# Patient Record
Sex: Female | Born: 1990 | Race: White | Hispanic: Yes | Marital: Married | Smoking: Never smoker
Health system: Southern US, Community
[De-identification: ages and names within clinical notes are randomized; demographics above are authoritative.]

## PROBLEM LIST (undated history)

## (undated) ENCOUNTER — Inpatient Hospital Stay: Payer: Self-pay

## (undated) DIAGNOSIS — F329 Major depressive disorder, single episode, unspecified: Secondary | ICD-10-CM

## (undated) DIAGNOSIS — F32A Depression, unspecified: Secondary | ICD-10-CM

## (undated) DIAGNOSIS — G43909 Migraine, unspecified, not intractable, without status migrainosus: Secondary | ICD-10-CM

## (undated) DIAGNOSIS — F53 Postpartum depression: Secondary | ICD-10-CM

## (undated) DIAGNOSIS — O99345 Other mental disorders complicating the puerperium: Secondary | ICD-10-CM

## (undated) DIAGNOSIS — F419 Anxiety disorder, unspecified: Secondary | ICD-10-CM

## (undated) DIAGNOSIS — E119 Type 2 diabetes mellitus without complications: Secondary | ICD-10-CM

## (undated) HISTORY — PX: FOOT SURGERY: SHX648

## (undated) HISTORY — PX: KNEE SURGERY: SHX244

## (undated) HISTORY — DX: Depression, unspecified: F32.A

## (undated) HISTORY — PX: CHOLECYSTECTOMY: SHX55

## (undated) HISTORY — DX: Other mental disorders complicating the puerperium: O99.345

## (undated) HISTORY — DX: Postpartum depression: F53.0

## (undated) HISTORY — DX: Major depressive disorder, single episode, unspecified: F32.9

---

## 2013-02-16 ENCOUNTER — Emergency Department: Payer: Self-pay | Admitting: Emergency Medicine

## 2013-06-09 ENCOUNTER — Emergency Department: Payer: Self-pay | Admitting: Internal Medicine

## 2013-06-09 LAB — URINALYSIS, COMPLETE
Bacteria: NONE SEEN
Leukocyte Esterase: NEGATIVE
Ph: 7 (ref 4.5–8.0)
Protein: NEGATIVE
Specific Gravity: 1.003 (ref 1.003–1.030)
Squamous Epithelial: <1
WBC UR: <1 /HPF (ref 0–5)

## 2013-06-09 LAB — CBC
MCH: 29.1 pg (ref 26.0–34.0)
MCHC: 33.5 g/dL (ref 32.0–36.0)
MCV: 87 fL (ref 80–100)
Platelet: 321 10*3/uL (ref 150–440)
RBC: 4.7 10*6/uL (ref 3.80–5.20)
RDW: 13.1 % (ref 11.5–14.5)
WBC: 10.1 10*3/uL (ref 3.6–11.0)

## 2013-06-09 LAB — COMPREHENSIVE METABOLIC PANEL WITH GFR
Albumin: 3.8 g/dL
Alkaline Phosphatase: 90 U/L
Anion Gap: 6 — ABNORMAL LOW
BUN: 6 mg/dL — ABNORMAL LOW
Bilirubin,Total: 0.9 mg/dL
Calcium, Total: 9 mg/dL
Chloride: 106 mmol/L
Co2: 26 mmol/L
Creatinine: 0.65 mg/dL
EGFR (African American): >60
EGFR (Non-African Amer.): >60
Glucose: 91 mg/dL
Osmolality: 273
Potassium: 3.6 mmol/L
SGOT(AST): 26 U/L
SGPT (ALT): 27 U/L
Sodium: 138 mmol/L
Total Protein: 7.8 g/dL

## 2013-06-09 LAB — LIPASE, BLOOD: Lipase: 76 U/L (ref 73–393)

## 2013-06-09 LAB — GC/CHLAMYDIA PROBE AMP

## 2013-06-09 LAB — WET PREP, GENITAL

## 2014-06-17 ENCOUNTER — Emergency Department: Payer: Self-pay | Admitting: Emergency Medicine

## 2014-06-17 LAB — CBC WITH DIFFERENTIAL/PLATELET
BASOS PCT: 0.5 %
Basophil #: 0.1 10*3/uL (ref 0.0–0.1)
Eosinophil #: 0.1 10*3/uL (ref 0.0–0.7)
Eosinophil %: 1.3 %
HCT: 42 % (ref 35.0–47.0)
HGB: 13.4 g/dL (ref 12.0–16.0)
Lymphocyte #: 2.8 10*3/uL (ref 1.0–3.6)
Lymphocyte %: 25.7 %
MCH: 27.8 pg (ref 26.0–34.0)
MCHC: 31.9 g/dL — ABNORMAL LOW (ref 32.0–36.0)
MCV: 87 fL (ref 80–100)
Monocyte #: 0.8 x10 3/mm (ref 0.2–0.9)
Monocyte %: 7.4 %
NEUTROS ABS: 7 10*3/uL — AB (ref 1.4–6.5)
NEUTROS PCT: 65.1 %
PLATELETS: 333 10*3/uL (ref 150–440)
RBC: 4.82 10*6/uL (ref 3.80–5.20)
RDW: 13 % (ref 11.5–14.5)
WBC: 10.7 10*3/uL (ref 3.6–11.0)

## 2014-06-17 LAB — URINALYSIS, COMPLETE
BACTERIA: NONE SEEN
Bilirubin,UR: NEGATIVE
GLUCOSE, UR: NEGATIVE mg/dL (ref 0–75)
Ketone: NEGATIVE
LEUKOCYTE ESTERASE: NEGATIVE
Nitrite: NEGATIVE
PH: 6 (ref 4.5–8.0)
PROTEIN: NEGATIVE
RBC,UR: 1 /HPF (ref 0–5)
Specific Gravity: 1.005 (ref 1.003–1.030)
Squamous Epithelial: 1
WBC UR: <1 /HPF (ref 0–5)

## 2014-06-17 LAB — BASIC METABOLIC PANEL
Anion Gap: 6 — ABNORMAL LOW (ref 7–16)
BUN: 7 mg/dL (ref 7–18)
Calcium, Total: 9.6 mg/dL (ref 8.5–10.1)
Chloride: 104 mmol/L (ref 98–107)
Co2: 28 mmol/L (ref 21–32)
Creatinine: 0.77 mg/dL (ref 0.60–1.30)
EGFR (African American): >60
EGFR (Non-African Amer.): >60
Glucose: 102 mg/dL — ABNORMAL HIGH (ref 65–99)
Osmolality: 274 (ref 275–301)
Potassium: 3.8 mmol/L (ref 3.5–5.1)
Sodium: 138 mmol/L (ref 136–145)

## 2014-09-26 DIAGNOSIS — G43009 Migraine without aura, not intractable, without status migrainosus: Secondary | ICD-10-CM | POA: Insufficient documentation

## 2014-10-27 ENCOUNTER — Emergency Department: Payer: Self-pay | Admitting: Emergency Medicine

## 2014-11-01 ENCOUNTER — Ambulatory Visit: Payer: Self-pay | Admitting: Podiatry

## 2014-11-01 LAB — CBC WITH DIFFERENTIAL/PLATELET
Basophil #: 0 10*3/uL (ref 0.0–0.1)
Basophil %: 0.4 %
Eosinophil #: 0.2 10*3/uL (ref 0.0–0.7)
Eosinophil %: 1.9 %
HCT: 42.5 % (ref 35.0–47.0)
HGB: 13.8 g/dL (ref 12.0–16.0)
Lymphocyte #: 3.1 10*3/uL (ref 1.0–3.6)
Lymphocyte %: 39 %
MCH: 28.9 pg (ref 26.0–34.0)
MCHC: 32.6 g/dL (ref 32.0–36.0)
MCV: 89 fL (ref 80–100)
MONO ABS: 0.6 x10 3/mm (ref 0.2–0.9)
Monocyte %: 7.8 %
NEUTROS PCT: 50.9 %
Neutrophil #: 4 10*3/uL (ref 1.4–6.5)
Platelet: 323 10*3/uL (ref 150–440)
RBC: 4.8 10*6/uL (ref 3.80–5.20)
RDW: 12.6 % (ref 11.5–14.5)
WBC: 7.8 10*3/uL (ref 3.6–11.0)

## 2015-02-19 ENCOUNTER — Emergency Department: Payer: Self-pay | Admitting: Emergency Medicine

## 2015-04-19 NOTE — Op Note (Signed)
PATIENT NAME:  Shannon [name], Shannon [name] MR#:  [id number] DATE OF BIRTH:  05-18-1991  DATE OF PROCEDURE:  11/01/2014  PREOPERATIVE DIAGNOSIS: Ganglion cyst, left foot.   POSTOPERATIVE DIAGNOSIS: Ganglion cyst, left foot.   PROCEDURE: Excision ganglion cyst, left foot.   SURGEON: Linus Galasodd Sharmayne Jablon, DPM  ANESTHESIA: Local MAC.   HEMOSTASIS: Pneumatic tourniquet, left ankle, 250 mmHg.   ESTIMATED BLOOD LOSS: Minimal.   PATHOLOGY: Soft tissue cyst, left foot.   MATERIALS: None.   COMPLICATIONS: None apparent.   OPERATIVE INDICATIONS: This is [name] 24 year old female with [name] history of [name] painful cyst on the side of her left foot. She elects to have surgical removal due to her job demands.   OPERATIVE PROCEDURE: The patient was taken to the operating room and placed on the table in the supine position. Following satisfactory sedation, the left foot was anesthetized with 10 mL of 0.5% bupivacaine plain around the lesion on the lateral aspect of the left foot. [name] pneumatic tourniquet was applied at the level of the left ankle and the foot was prepped and draped in the usual sterile fashion. The foot was exsanguinated and the tourniquet inflated to 250 mmHg.   Attention was then directed to the lateral aspect of the right foot where an approximate 5 cm lazy S type incision was made over the cystic area. The incision was very carefully deepened via sharp and blunt dissection down to the level of the cyst. The normal surrounding anatomy was removed from around the cyst and it was removed in toto. The wound was then flushed with copious amounts of sterile saline and closed using 4-0 Vicryl running suture for all layers from deep and superficial subcutaneous to skin closure. Tincture of benzoin and Steri-Strips followed by Xeroform, 4 x 4's, Kerlix and an Ace wrap. The tourniquet was released and blood flow was noted to return immediately to the left foot in digits 1 through 4. The patient tolerated the procedure and  anesthesia well and was transported to the PAC-U with vital signs stable and in good condition.  ____________________________ Linus Galasodd Trenity Pha, DPM tc:sb D: 11/01/2014 10:46:47 ET T: 11/01/2014 11:30:15 ET JOB#: [id number]  cc: Linus Galasodd Mirai Greenwood, DPM, <Dictator> Narek Kniss DPM ELECTRONICALLY SIGNED 11/01/2014 13:57

## 2015-04-21 LAB — SURGICAL PATHOLOGY

## 2015-06-12 ENCOUNTER — Emergency Department
Admission: EM | Admit: 2015-06-12 | Discharge: 2015-06-13 | Disposition: A | Discharge status: Home / Self Care | Payer: Worker's Compensation | Attending: Emergency Medicine | Admitting: Emergency Medicine

## 2015-06-12 ENCOUNTER — Emergency Department: Payer: Worker's Compensation

## 2015-06-12 ENCOUNTER — Encounter: Payer: Self-pay | Admitting: *Deleted

## 2015-06-12 DIAGNOSIS — Y9389 Activity, other specified: Secondary | ICD-10-CM | POA: Insufficient documentation

## 2015-06-12 DIAGNOSIS — S8992XA Unspecified injury of left lower leg, initial encounter: Secondary | ICD-10-CM | POA: Diagnosis not present

## 2015-06-12 DIAGNOSIS — S50311A Abrasion of right elbow, initial encounter: Secondary | ICD-10-CM | POA: Diagnosis not present

## 2015-06-12 DIAGNOSIS — Y998 Other external cause status: Secondary | ICD-10-CM | POA: Diagnosis not present

## 2015-06-12 DIAGNOSIS — T148 Other injury of unspecified body region: Secondary | ICD-10-CM | POA: Insufficient documentation

## 2015-06-12 DIAGNOSIS — Y9289 Other specified places as the place of occurrence of the external cause: Secondary | ICD-10-CM | POA: Insufficient documentation

## 2015-06-12 DIAGNOSIS — S0990XA Unspecified injury of head, initial encounter: Secondary | ICD-10-CM | POA: Diagnosis not present

## 2015-06-12 DIAGNOSIS — T148XXA Other injury of unspecified body region, initial encounter: Secondary | ICD-10-CM

## 2015-06-12 HISTORY — DX: Migraine, unspecified, not intractable, without status migrainosus: G43.909

## 2015-06-12 MED ORDER — ACETAMINOPHEN 500 MG PO TABS
1000.0000 mg | ORAL_TABLET | Freq: Once | ORAL | Status: AC
  Administered 2015-06-12: 1000 mg via ORAL

## 2015-06-12 MED ORDER — ONDANSETRON 4 MG PO TBDP
4.0000 mg | ORAL_TABLET | Freq: Once | ORAL | Status: AC
Start: 2015-06-12 — End: 2015-06-12
  Administered 2015-06-12: 4 mg via ORAL

## 2015-06-12 MED ORDER — ONDANSETRON 4 MG PO TBDP
ORAL_TABLET | ORAL | Status: AC
Start: 2015-06-12 — End: 2015-06-12
  Administered 2015-06-12: 4 mg via ORAL
  Filled 2015-06-12: qty 1

## 2015-06-12 MED ORDER — ACETAMINOPHEN 500 MG PO TABS
ORAL_TABLET | ORAL | Status: AC
  Administered 2015-06-12: 1000 mg via ORAL
  Filled 2015-06-12: qty 2
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
## 2015-06-12 NOTE — ED Notes (Signed)
Wound on right elbow washed with sterile saline and dressed with gauze.  Wounds on knees bilaterally washed with sterile saline and dressed with gauze.

## 2015-06-12 NOTE — Discharge Instructions (Signed)
Please seek medical attention for any high fevers, chest pain, shortness of breath, change in behavior, persistent vomiting, bloody stool or any other new or concerning symptoms. ° ° °Concussion °A concussion, or closed-head injury, is a brain injury caused by a direct blow to the head or by a quick and sudden movement (jolt) of the head or neck. Concussions are usually not life-threatening. Even so, the effects of a concussion can be serious. If you have had a concussion before, you are more likely to experience concussion-like symptoms after a direct blow to the head.  °CAUSES °· Direct blow to the head, such as from running into another player during a soccer game, being hit in a fight, or hitting your head on a hard surface. °· A jolt of the head or neck that causes the brain to move back and forth inside the skull, such as in a car crash. °SIGNS AND SYMPTOMS °The signs of a concussion can be hard to notice. Early on, they may be missed by you, family members, and health care providers. You may look fine but act or feel differently. °Symptoms are usually temporary, but they may last for days, weeks, or even longer. Some symptoms may appear right away while others may not show up for hours or days. Every head injury is different. Symptoms include: °· Mild to moderate headaches that will not go away. °· A feeling of pressure inside your head. °· Having more trouble than usual: °¨ Learning or remembering things you have heard. °¨ Answering questions. °¨ Paying attention or concentrating. °¨ Organizing daily tasks. °¨ Making decisions and solving problems. °· Slowness in thinking, acting or reacting, speaking, or reading. °· Getting lost or being easily confused. °· Feeling tired all the time or lacking energy (fatigued). °· Feeling drowsy. °· Sleep disturbances. °¨ Sleeping more than usual. °¨ Sleeping less than usual. °¨ Trouble falling asleep. °¨ Trouble sleeping (insomnia). °· Loss of balance or feeling  lightheaded or dizzy. °· Nausea or vomiting. °· Numbness or tingling. °· Increased sensitivity to: °¨ Sounds. °¨ Lights. °¨ Distractions. °· Vision problems or eyes that tire easily. °· Diminished sense of taste or smell. °· Ringing in the ears. °· Mood changes such as feeling sad or anxious. °· Becoming easily irritated or angry for little or no reason. °· Lack of motivation. °· Seeing or hearing things other people do not see or hear (hallucinations). °DIAGNOSIS °Your health care provider can usually diagnose a concussion based on a description of your injury and symptoms. He or she will ask whether you passed out (lost consciousness) and whether you are having trouble remembering events that happened right before and during your injury. °Your evaluation might include: °· A brain scan to look for signs of injury to the brain. Even if the test shows no injury, you may still have a concussion. °· Blood tests to be sure other problems are not present. °TREATMENT °· Concussions are usually treated in an emergency department, in urgent care, or at a clinic. You may need to stay in the hospital overnight for further treatment. °· Tell your health care provider if you are taking any medicines, including prescription medicines, over-the-counter medicines, and natural remedies. Some medicines, such as blood thinners (anticoagulants) and aspirin, may increase the chance of complications. Also tell your health care provider whether you have had alcohol or are taking illegal drugs. This information may affect treatment. °· Your health care provider will send you home with important instructions to follow. °·   How fast you will recover from a concussion depends on many factors. These factors include how severe your concussion is, what part of your brain was injured, your age, and how healthy you were before the concussion.  Most people with mild injuries recover fully. Recovery can take time. In general, recovery is slower in  older persons. Also, persons who have had a concussion in the past or have other medical problems may find that it takes longer to recover from their current injury. HOME CARE INSTRUCTIONS General Instructions  Carefully follow the directions your health care provider gave you.  Only take over-the-counter or prescription medicines for pain, discomfort, or fever as directed by your health care provider.  Take only those medicines that your health care provider has approved.  Do not drink alcohol until your health care provider says you are well enough to do so. Alcohol and certain other drugs may slow your recovery and can put you at risk of further injury.  If it is harder than usual to remember things, write them down.  If you are easily distracted, try to do one thing at a time. For example, do not try to watch TV while fixing dinner.  Talk with family members or close friends when making important decisions.  Keep all follow-up appointments. Repeated evaluation of your symptoms is recommended for your recovery.  Watch your symptoms and tell others to do the same. Complications sometimes occur after a concussion. Older adults with a brain injury may have a higher risk of serious complications, such as a blood clot on the brain.  Tell your teachers, school nurse, school counselor, coach, athletic trainer, or work Production designer, theatre/television/film about your injury, symptoms, and restrictions. Tell them about what you can or cannot do. They should watch for:  Increased problems with attention or concentration.  Increased difficulty remembering or learning new information.  Increased time needed to complete tasks or assignments.  Increased irritability or decreased ability to cope with stress.  Increased symptoms.  Rest. Rest helps the brain to heal. Make sure you:  Get plenty of sleep at night. Avoid staying up late at night.  Keep the same bedtime hours on weekends and weekdays.  Rest during the day.  Take daytime naps or rest breaks when you feel tired.  Limit activities that require a lot of thought or concentration. These include:  Doing homework or job-related work.  Watching TV.  Working on the computer.  Avoid any situation where there is potential for another head injury (football, hockey, soccer, basketball, martial arts, downhill snow sports and horseback riding). Your condition will get worse every time you experience a concussion. You should avoid these activities until you are evaluated by the appropriate follow-up health care providers. Returning To Your Regular Activities You will need to return to your normal activities slowly, not all at once. You must give your body and brain enough time for recovery.  Do not return to sports or other athletic activities until your health care provider tells you it is safe to do so.  Ask your health care provider when you can drive, ride a bicycle, or operate heavy machinery. Your ability to react may be slower after a brain injury. Never do these activities if you are dizzy.  Ask your health care provider about when you can return to work or school. Preventing Another Concussion It is very important to avoid another brain injury, especially before you have recovered. In rare cases, another injury can lead to permanent  brain damage, brain swelling, or death. The risk of this is greatest during the first 7-10 days after a head injury. Avoid injuries by:  Wearing a seat belt when riding in a car.  Drinking alcohol only in moderation.  Wearing a helmet when biking, skiing, skateboarding, skating, or doing similar activities.  Avoiding activities that could lead to a second concussion, such as contact or recreational sports, until your health care provider says it is okay.  Taking safety measures in your home.  Remove clutter and tripping hazards from floors and stairways.  Use grab bars in bathrooms and handrails by stairs.  Place  non-slip mats on floors and in bathtubs.  Improve lighting in dim areas. SEEK MEDICAL CARE IF:  You have increased problems paying attention or concentrating.  You have increased difficulty remembering or learning new information.  You need more time to complete tasks or assignments than before.  You have increased irritability or decreased ability to cope with stress.  You have more symptoms than before. Seek medical care if you have any of the following symptoms for more than 2 weeks after your injury:  Lasting (chronic) headaches.  Dizziness or balance problems.  Nausea.  Vision problems.  Increased sensitivity to noise or light.  Depression or mood swings.  Anxiety or irritability.  Memory problems.  Difficulty concentrating or paying attention.  Sleep problems.  Feeling tired all the time. SEEK IMMEDIATE MEDICAL CARE IF:  You have severe or worsening headaches. These may be a sign of a blood clot in the brain.  You have weakness (even if only in one hand, leg, or part of the face).  You have numbness.  You have decreased coordination.  You vomit repeatedly.  You have increased sleepiness.  One pupil is larger than the other.  You have convulsions.  You have slurred speech.  You have increased confusion. This may be a sign of a blood clot in the brain.  You have increased restlessness, agitation, or irritability.  You are unable to recognize people or places.  You have neck pain.  It is difficult to wake you up.  You have unusual behavior changes.  You lose consciousness. MAKE SURE YOU:  Understand these instructions.  Will watch your condition.  Will get help right away if you are not doing well or get worse. Document Released: 03/04/2004 Document Revised: 12/18/2013 Document Reviewed: 07/05/2013 [facility] Patient Information 2015 [employer] [place]. This information is not intended to replace advice given to you by your health care  provider. Make sure you discuss any questions you have with your health care provider.

## 2015-06-12 NOTE — ED Notes (Signed)
Notified by charge nurse that pt needs to provide urine sample for workman's comp.  Pt notified by charge nurse
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
## 2015-10-03 LAB — OB RESULTS CONSOLE HIV ANTIBODY (ROUTINE TESTING): HIV: NONREACTIVE

## 2015-10-03 LAB — OB RESULTS CONSOLE ABO/RH: RH Type: POSITIVE

## 2015-10-03 LAB — OB RESULTS CONSOLE HGB/HCT, BLOOD
HEMATOCRIT: 40 %
HEMOGLOBIN: 13.6 g/dL

## 2015-10-03 LAB — OB RESULTS CONSOLE HEPATITIS B SURFACE ANTIGEN: Hepatitis B Surface Ag: NEGATIVE

## 2015-10-03 LAB — OB RESULTS CONSOLE RUBELLA ANTIBODY, IGM: Rubella: IMMUNE

## 2015-10-03 LAB — OB RESULTS CONSOLE PLATELET COUNT: Platelets: 376 10*3/uL

## 2015-10-03 LAB — OB RESULTS CONSOLE RPR: RPR: NONREACTIVE

## 2015-10-03 LAB — OB RESULTS CONSOLE VARICELLA ZOSTER ANTIBODY, IGG: VARICELLA IGG: IMMUNE

## 2015-10-03 LAB — OB RESULTS CONSOLE ANTIBODY SCREEN: ANTIBODY SCREEN: NEGATIVE

## 2015-12-18 ENCOUNTER — Encounter: Payer: Self-pay | Admitting: Emergency Medicine

## 2015-12-18 ENCOUNTER — Emergency Department
Admission: EM | Admit: 2015-12-18 | Discharge: 2015-12-18 | Disposition: A | Discharge status: Home / Self Care | Payer: BLUE CROSS/BLUE SHIELD | Attending: Emergency Medicine | Admitting: Emergency Medicine

## 2015-12-18 ENCOUNTER — Emergency Department: Payer: BLUE CROSS/BLUE SHIELD

## 2015-12-18 DIAGNOSIS — S3991XA Unspecified injury of abdomen, initial encounter: Secondary | ICD-10-CM | POA: Insufficient documentation

## 2015-12-18 DIAGNOSIS — Z3492 Encounter for supervision of normal pregnancy, unspecified, second trimester: Secondary | ICD-10-CM

## 2015-12-18 DIAGNOSIS — O9A212 Injury, poisoning and certain other consequences of external causes complicating pregnancy, second trimester: Secondary | ICD-10-CM | POA: Insufficient documentation

## 2015-12-18 DIAGNOSIS — Z79899 Other long term (current) drug therapy: Secondary | ICD-10-CM | POA: Insufficient documentation

## 2015-12-18 DIAGNOSIS — Y9389 Activity, other specified: Secondary | ICD-10-CM | POA: Insufficient documentation

## 2015-12-18 DIAGNOSIS — Y92410 Unspecified street and highway as the place of occurrence of the external cause: Secondary | ICD-10-CM | POA: Insufficient documentation

## 2015-12-18 DIAGNOSIS — Z3A18 18 weeks gestation of pregnancy: Secondary | ICD-10-CM | POA: Diagnosis not present

## 2015-12-18 DIAGNOSIS — R109 Unspecified abdominal pain: Secondary | ICD-10-CM

## 2015-12-18 DIAGNOSIS — Y998 Other external cause status: Secondary | ICD-10-CM | POA: Insufficient documentation

## 2015-12-18 LAB — URINALYSIS COMPLETE WITH MICROSCOPIC (ARMC ONLY)
Bilirubin Urine: NEGATIVE
GLUCOSE, UA: NEGATIVE mg/dL
Ketones, ur: NEGATIVE mg/dL
LEUKOCYTES UA: NEGATIVE
Nitrite: NEGATIVE
Protein, ur: NEGATIVE mg/dL
SPECIFIC GRAVITY, URINE: 1.008 (ref 1.005–1.030)
pH: 6 (ref 5.0–8.0)

## 2015-12-18 LAB — ABO/RH: ABO/RH(D): O POS

## 2015-12-18 LAB — CBC
HEMATOCRIT: 39.9 % (ref 35.0–47.0)
HEMOGLOBIN: 13.2 g/dL (ref 12.0–16.0)
MCH: 28.8 pg (ref 26.0–34.0)
MCHC: 33 g/dL (ref 32.0–36.0)
MCV: 87.5 fL (ref 80.0–100.0)
Platelets: 361 10*3/uL (ref 150–440)
RBC: 4.56 MIL/uL (ref 3.80–5.20)
RDW: 12.9 % (ref 11.5–14.5)
WBC: 12 10*3/uL — AB (ref 3.6–11.0)

## 2015-12-18 LAB — HCG, QUANTITATIVE, PREGNANCY: HCG, BETA CHAIN, QUANT, S: 37169 m[IU]/mL — AB (ref <5)
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
## 2015-12-18 NOTE — ED Notes (Signed)
Pt dc home ambulatory rates pain 1/10 instructed on follow up plan no RX provided. PT NAD AT DC
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
## 2015-12-28 NOTE — L&D Delivery Note (Addendum)
Obstetrical Delivery Note   Date of Delivery:   05/21/2016 Primary OB:   [facility] Gestational Age/EDD: [id number]  Antepartum complications: BMI 33, GBS positive  Delivered By:   Cornelia Copaharlie Jullian Clayson, Jr MD  Delivery Type:   vaginal birth after cesarean (VBAC)  Delivery Details:   Patient pushed well and easily to +3. Peds in room for meconium on admission and during labor. Over several UCs, patient delivered infant in OA position. Loose nuchal x 1 reduced and then rest of infant easily delivered. Thick meconium seen and cord cut and clamped immediately and handed to peds team. Anesthesia:    epidural Intrapartum complications: Thick meconium GBS:    Positive and she was adequately treated with pencillin Laceration:    Right peri-uretheral sutured with 3-0 plain gut.  Episiotomy:    none Placenta:    Delivered and expressed via active management. Intact: yes. To pathology: no.  Estimated Blood Loss:  250mL  Baby:    Liveborn female, APGARs 8/9, weight 3590gm  Cornelia Copaharlie Harold Mattes, Jr. MD [facility] Pager [phone]

## 2016-04-01 ENCOUNTER — Observation Stay
Admission: EM | Admit: 2016-04-01 | Discharge: 2016-04-02 | Disposition: A | Discharge status: Home / Self Care | Payer: BLUE CROSS/BLUE SHIELD | Attending: Obstetrics & Gynecology | Admitting: Obstetrics & Gynecology

## 2016-04-01 DIAGNOSIS — Z3A33 33 weeks gestation of pregnancy: Secondary | ICD-10-CM | POA: Insufficient documentation

## 2016-04-01 LAB — URINALYSIS COMPLETE WITH MICROSCOPIC (ARMC ONLY)
BACTERIA UA: NONE SEEN
BILIRUBIN URINE: NEGATIVE
Glucose, UA: NEGATIVE mg/dL
HGB URINE DIPSTICK: NEGATIVE
Ketones, ur: NEGATIVE mg/dL
LEUKOCYTES UA: NEGATIVE
NITRITE: NEGATIVE
PH: 6 (ref 5.0–8.0)
PROTEIN: NEGATIVE mg/dL
Specific Gravity, Urine: 1.009 (ref 1.005–1.030)

## 2016-04-01 MED ORDER — ACETAMINOPHEN 325 MG PO TABS
650.0000 mg | ORAL_TABLET | ORAL | Status: DC | PRN
  Administered 2016-04-01: 650 mg via ORAL
  Filled 2016-04-01: qty 2

## 2016-04-01 MED ORDER — BETAMETHASONE SOD PHOS & ACET 6 (3-3) MG/ML IJ SUSP
12.0000 mg | INTRAMUSCULAR | Status: DC
  Administered 2016-04-01: 12 mg via INTRAMUSCULAR
  Filled 2016-04-01: qty 2

## 2016-04-01 MED ORDER — ONDANSETRON HCL 4 MG/2ML IJ SOLN: 4.0000 mg | Freq: Four times a day (QID) | INTRAMUSCULAR | Status: DC | PRN

## 2016-04-01 MED ORDER — LACTATED RINGERS IV BOLUS (SEPSIS)
1000.0000 mL | Freq: Once | INTRAVENOUS | Status: AC
  Administered 2016-04-01: 1000 mL via INTRAVENOUS

## 2016-04-01 NOTE — OB Triage Note (Signed)
Pt present in no acute distress, reports she started having pressure in lower abdomen this morning, walked a lot to see if that would relieve the pressure, then started having painful contractions and lower back pain since about 5:45p this evening, says it worsened and then relieved since that time, rates pain when present 5/10. States she was sick this week with n/v/d, seen on Tues and was given rx for Phenergan suppository, took med Tues and has not had to take any since then, started tolerating po yesterday and today was able to keep liquids and solid foods down without n/v/d. Denies cough cold sym, fever or chills. Concerned that she may have a uti, feels pressure and having hesitancy with urination since this morning. Denies dysuria, hematuria, frequency, urgency, or odor. Pt confirms +FM, denies vag bleeding, spotting, leaking fluid or unusual vaginal discharge. [facility], next appt 04/12/16. [facility]

## 2016-04-02 ENCOUNTER — Inpatient Hospital Stay
Admission: RE | Admit: 2016-04-02 | Discharge: 2016-04-02 | Disposition: A | Discharge status: Home / Self Care | Payer: BLUE CROSS/BLUE SHIELD | Attending: Obstetrics and Gynecology | Admitting: Obstetrics and Gynecology

## 2016-04-02 DIAGNOSIS — Z3490 Encounter for supervision of normal pregnancy, unspecified, unspecified trimester: Secondary | ICD-10-CM | POA: Insufficient documentation

## 2016-04-02 MED ORDER — BETAMETHASONE SOD PHOS & ACET 6 (3-3) MG/ML IJ SUSP
12.0000 mg | Freq: Once | INTRAMUSCULAR | Status: AC
  Administered 2016-04-02: 12 mg via INTRAMUSCULAR

## 2016-04-02 MED ORDER — ZOLPIDEM TARTRATE 5 MG PO TABS
5.0000 mg | ORAL_TABLET | Freq: Once | ORAL | Status: AC
  Administered 2016-04-02: 5 mg via ORAL
  Filled 2016-04-02: qty 1
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
## 2016-04-02 NOTE — Final Progress Note (Signed)
Physician Final Progress Note  Patient ID: Shannon [name] MRN: [id number] DOB/AGE: [birth year] 24 y.o.  Admit date: 04/01/2016 Admitting provider: Nadara Mustardobert P Elenore Wanninger, MD Discharge date: 04/02/2016   Admission Diagnoses: Contractions  Discharge Diagnoses:  Active Problems:   Preterm labor  Procedures: A NST procedure was performed with FHR monitoring and a normal baseline established, appropriate time of 20-40 minutes of evaluation, and accels >2 seen w 15x15 characteristics.  Results show a REACTIVE NST.   Discharge Condition: good  Disposition: 01-Home or Self Care  Diet: Regular diet  Discharge Activity: No lifting, driving, or strenuous exercise for 1 week    Medication List    ASK your doctor about these medications        acetaminophen 500 MG tablet  Commonly known as:  TYLENOL  Take 1,000 mg by mouth every 6 (six) hours as needed (back and abdominal pain).  Notes to Patient:  Last dose of Tylenol 650mg  was given at 1048 pm     norethindrone-ethinyl estradiol-iron 1-20/1-30/1-35 MG-MCG tablet  Commonly known as:  ESTROSTEP FE,TILIA FE,TRI-LEGEST FE  Take 1 tablet by mouth daily. Reported on 04/01/2016     PRENATAL VITAMIN PO  Take 1 tablet by mouth daily.           Follow-up Information    Follow up with [facility]. Go in 10 days.   Why:  return to hospital sooner if needed or if symptoms persist or worsens      Triage note, nurse visit (MD did not see pt).  I was able to review NST, labs, and d/w nurse.  Signed: Letitia LibraRobert Paul Cristal Qadir 04/02/2016, 7:57 AM

## 2016-04-12 ENCOUNTER — Observation Stay
Admission: EM | Admit: 2016-04-12 | Discharge: 2016-04-12 | Disposition: A | Discharge status: Home / Self Care | Payer: BLUE CROSS/BLUE SHIELD | Attending: Obstetrics and Gynecology | Admitting: Obstetrics and Gynecology

## 2016-04-12 ENCOUNTER — Encounter: Payer: Self-pay | Admitting: *Deleted

## 2016-04-12 DIAGNOSIS — Z3A34 34 weeks gestation of pregnancy: Secondary | ICD-10-CM | POA: Diagnosis not present

## 2016-04-12 DIAGNOSIS — O4703 False labor before 37 completed weeks of gestation, third trimester: Principal | ICD-10-CM | POA: Insufficient documentation

## 2016-04-12 NOTE — Discharge Summary (Signed)
Patient discharged home, discharge instructions given, patient states understanding. Patient left floor in stable condition, denies any other needs at this time. Patient to keep next scheduled OB appointment 

## 2016-04-12 NOTE — Discharge Instructions (Signed)
Drink plenty of fluid and get plenty of rest. Call your provider for any other concerns °

## 2016-04-12 NOTE — Final Progress Note (Signed)
Physician Final Progress Note  Patient ID: Shannon [name] MRN: [id number] DOB/AGE: 01-22-1991 25 y.o.  Admit date: 04/12/2016 Admitting provider: Ruth Bing, MD Discharge date: 04/12/2016   Admission Diagnoses: preterm contractions  Discharge Diagnoses:  Active Problems:   Indication for care in labor and delivery, antepartum  IUP at 34 1/7   Consults: None  Significant Findings/ Diagnostic Studies: none  Procedures: NST Category I Baseline 150 bpm, moderate variability, + accelerations, - decelerations  Discharge Condition: good  Disposition: 01-Home or Self Care  Diet: Regular diet  Discharge Activity: Activity as tolerated      Discharge Instructions    Discharge activity:  No Restrictions    Complete by:  As directed      Discharge diet:  No restrictions    Complete by:  As directed      Fetal Kick Count:  Lie on our left side for one hour after a meal, and count the number of times your baby kicks.  If it is less than 5 times, get up, move around and drink some juice.  Repeat the test 30 minutes later.  If it is still less than 5 kicks in an hour, notify your doctor.    Complete by:  As directed      No sexual activity restrictions    Complete by:  As directed      Notify physician for a general feeling that "something is not right"    Complete by:  As directed      Notify physician for increase or change in vaginal discharge    Complete by:  As directed      Notify physician for intestinal cramps, with or without diarrhea, sometimes described as "gas pain"    Complete by:  As directed      Notify physician for leaking of fluid    Complete by:  As directed      Notify physician for low, dull backache, unrelieved by heat or Tylenol    Complete by:  As directed      Notify physician for menstrual like cramps    Complete by:  As directed      Notify physician for pelvic pressure    Complete by:  As directed      Notify physician for uterine contractions.   These may be painless and feel like the uterus is tightening or the baby is  "balling up"    Complete by:  As directed      Notify physician for vaginal bleeding    Complete by:  As directed      PRETERM LABOR:  Includes any of the follwing symptoms that occur between 20 - [redacted] weeks gestation.  If these symptoms are not stopped, preterm labor can result in preterm delivery, placing your baby at risk    Complete by:  As directed             Medication List    TAKE these medications        acetaminophen 500 MG tablet  Commonly known as:  TYLENOL  Take 1,000 mg by mouth every 6 (six) hours as needed (back and abdominal pain).     PRENATAL VITAMIN PO  Take 1 tablet by mouth daily.       Follow-up Information    Please follow up.   Why:  keep regular scheduled appointment      Follow up On 04/12/2016.   Why:  As needed, If symptoms worsen  Total time spent taking care of this patient: 20 minutes  Signed: Tresea MallGLEDHILL,Leandria Thier, CNM  This patient and plan were discussed with Dr Vergie LivingPickens 04/12/2016

## 2016-04-12 NOTE — OB Triage Note (Signed)
patient states she woke up at 3am with contractions every 2 minutes, after drinking water and taking a warm bath they spaced out to every 10-5 minutes but have returned to every 5 minutes.  Denies LOF or vaginal bleeding, states baby is moving well

## 2016-04-25 ENCOUNTER — Inpatient Hospital Stay: Admission: EM | Admit: 2016-04-25 | Payer: Self-pay

## 2016-04-25 ENCOUNTER — Inpatient Hospital Stay
Admission: EM | Admit: 2016-04-25 | Discharge: 2016-04-25 | Disposition: A | Discharge status: Home / Self Care | Payer: BLUE CROSS/BLUE SHIELD | Attending: Obstetrics & Gynecology | Admitting: Obstetrics & Gynecology

## 2016-04-25 ENCOUNTER — Encounter: Payer: Self-pay | Admitting: *Deleted

## 2016-04-25 DIAGNOSIS — Z3A36 36 weeks gestation of pregnancy: Secondary | ICD-10-CM | POA: Insufficient documentation

## 2016-04-25 NOTE — OB Triage Note (Signed)
Contractions started at 0300 this morning and continues to contract.  Pain 7/10, back pain.

## 2016-04-25 NOTE — Final Progress Note (Signed)
Physician Final Progress Note  Patient ID: Shannon [name] MRN: [id number] DOB/AGE: [birth year] 24 y.o.  Admit date: 04/25/2016 Admitting provider: Nadara Mustardobert P Harris, MD Discharge date: 04/25/2016   Admission Diagnoses: Threatened preterm labor at [redacted] weeks gestation  Discharge Diagnoses:  False labor  Consults: None  Significant Findings/ Diagnostic Studies: 25 year old g1 p0 with EDC=05/23/2016 by a 6wk5d ultrasound presented at 36 weeks with c/o contractions since 0300 this Am. Contractions only lasting 30 sec, and they seemed to decrease in frequency on arrival to L&D. Cervix on arrival was 2.5cm-3cm/75%/-1 which was a change from last week when she was 1 cm. Felt a little leakage of fluid after ambulating to try to stimulate contractions. [facility] for [facility], elevated one hour GTT at 28 weeks (161) with a normal 3 hr GTT, and threatened preterm labor earlier this month at which time she received BMZ x 2 doses April 6&7. Exam: General: appears flushed, in NAD BP 116/80 mmHg  Pulse 101  Temp(Src) 98.7 F (37.1 C) (Oral)  Ht 5\' 1"  (1.549 m)  Wt 88.905 kg (196 lb)  BMI 37.05 kg/m2  LMP 06/12/2015  FHR: baseline 145-150 with accelerations to 170s to 180, moderate variability Toco: contractions q2-13 min apart initially, then q6-7 min apart after ambulation 4 hours later, although not feeling discomfort with the contractions Nitrazine: negative Cervix: 2.5-3/ 50%/-1 on repeat exam  A: IUP at 36 weeks with preterm contractions, no further cervical change Reactive NST/ Cat 1  P: DC home with labor precautions   Procedures: NST  Discharge Condition: stable  Disposition: 01-Home or Self Care  Diet: Regular diet  Discharge Activity: Activity as tolerated     Medication List    ASK your doctor about these medications        acetaminophen 500 MG tablet  Commonly known as:  TYLENOL  Take 1,000 mg by mouth every 6 (six) hours as needed (back and  abdominal pain).     PRENATAL VITAMIN PO  Take 1 tablet by mouth daily.       [facility]  Total time spent taking care of this patient: 15-20 minutes  Signed: Farrel ConnersGUTIERREZ, Persais Ethridge 04/25/2016, 4:27 PM

## 2016-04-25 NOTE — Discharge Instructions (Signed)
Discharge instructions reviewed with patient, patient verbalized understanding, copy signed and given.  Labor precautions reviewed with patient, pt. Verbalized understanding.  Encouraged patient to continue with scheduled OB appointments.

## 2016-04-29 LAB — OB RESULTS CONSOLE GC/CHLAMYDIA
Chlamydia: NEGATIVE
GC PROBE AMP, GENITAL: NEGATIVE

## 2016-04-29 LAB — OB RESULTS CONSOLE GBS: STREP GROUP B AG: POSITIVE

## 2016-05-02 ENCOUNTER — Observation Stay
Admission: EM | Admit: 2016-05-02 | Discharge: 2016-05-02 | Disposition: A | Discharge status: Home / Self Care | Payer: BLUE CROSS/BLUE SHIELD | Attending: Obstetrics & Gynecology | Admitting: Obstetrics & Gynecology

## 2016-05-02 DIAGNOSIS — Z3A37 37 weeks gestation of pregnancy: Secondary | ICD-10-CM | POA: Diagnosis not present

## 2016-05-02 DIAGNOSIS — R51 Headache: Secondary | ICD-10-CM | POA: Insufficient documentation

## 2016-05-02 DIAGNOSIS — O9989 Other specified diseases and conditions complicating pregnancy, childbirth and the puerperium: Secondary | ICD-10-CM | POA: Diagnosis present

## 2016-05-02 MED ORDER — BUTALBITAL-APAP-CAFFEINE 50-325-40 MG PO TABS
1.0000 | ORAL_TABLET | ORAL | Status: AC | PRN
  Administered 2016-05-02 (×2): 1 via ORAL
  Filled 2016-05-02 (×2): qty 1

## 2016-05-02 NOTE — OB Triage Note (Signed)
Pt c/o ctx since 830 AM this morning coming q 4 minutes.  HA "feels like a migraine" starting around 130 PM this afternoon.  Diarrhea and nausea since last night.  No vomiting, able to eat and drink normally.  Pt denies LOF and reports positive FM.

## 2016-05-05 ENCOUNTER — Inpatient Hospital Stay
Admission: EM | Admit: 2016-05-05 | Discharge: 2016-05-05 | Disposition: A | Discharge status: Home / Self Care | Payer: BLUE CROSS/BLUE SHIELD | Attending: Obstetrics and Gynecology | Admitting: Obstetrics and Gynecology

## 2016-05-05 DIAGNOSIS — Z3A37 37 weeks gestation of pregnancy: Secondary | ICD-10-CM | POA: Insufficient documentation

## 2016-05-05 NOTE — OB Triage Note (Signed)
Pt presents to L&D with c/o contractions since 2 am q 2 min. Denies LOF or vaginal bleeding, reports good fetal movement. EFM applied and explained, plan to monitor fetal and maternal well being and assess for labor. Pt has a h/o preterm labor this pregnancy and has had 2 doses of betamethasone.

## 2016-05-05 NOTE — Final Progress Note (Signed)
Physician Final Progress Note  Patient ID: Aniceto BossKarla A Topete MRN: [id number] DOB/AGE: Jan 10, 1991 24 y.o.  Admit date: 05/05/2016 Admitting provider: Conard NovakStephen D Jackson, MD Discharge date: 05/05/2016   Admission Diagnoses: Contractions/ IUP at 37.3 weeks  Discharge Diagnoses:  IUP at 37.3 weeks with false labor vs prodromal labor  Consults: None  Significant Findings/ Diagnostic Studies: 25 year old G1 P0 with EDC=05/23/2016 who presented this Am at 37.3 weeks with complaints of frequent contractions since 0300 this Am. Her contractions petered out with po hydration here and her cervix remained 3cm dilated over 2 hours.  FHR was reactive with baseline 150 and accelerations to 170s, moderate variability. She was discharged home with labor precautions.  Procedures: none  Discharge Condition: stable  Disposition: 01-Home or Self Care  Diet: Regular diet  Discharge Activity: Activity as tolerated     Medication List    ASK your doctor about these medications        acetaminophen 500 MG tablet  Commonly known as:  TYLENOL  Take 1,000 mg by mouth every 6 (six) hours as needed (back and abdominal pain).     PRENATAL VITAMIN PO  Take 1 tablet by mouth daily.         Total time spent taking care of this patient: 10 minutes  Signed: Dougles Kimmey 05/05/2016, 8:59 AM

## 2016-05-08 ENCOUNTER — Inpatient Hospital Stay
Admission: EM | Admit: 2016-05-08 | Discharge: 2016-05-09 | Disposition: A | Discharge status: Home / Self Care | Payer: BLUE CROSS/BLUE SHIELD | Attending: Obstetrics and Gynecology | Admitting: Obstetrics and Gynecology

## 2016-05-08 ENCOUNTER — Encounter: Payer: Self-pay | Admitting: *Deleted

## 2016-05-08 DIAGNOSIS — Z3A38 38 weeks gestation of pregnancy: Secondary | ICD-10-CM | POA: Insufficient documentation

## 2016-05-08 NOTE — OB Triage Note (Addendum)
Contractions every 4 minutes since 5pm, denies any other complain no LOF, vaginal beleeding. Pt states baby is moving well

## 2016-05-09 DIAGNOSIS — Z3A38 38 weeks gestation of pregnancy: Secondary | ICD-10-CM | POA: Diagnosis not present

## 2016-05-09 NOTE — Discharge Instructions (Signed)
Keep next office appt. Come to hosp. If cont. Get regular and more painful.

## 2016-05-09 NOTE — Final Progress Note (Signed)
Physician Final Progress Note  Patient ID: Shannon [name] MRN: [id number] DOB/AGE: 25-01-1991 24 y.o.  Admit date: 05/08/2016 Admitting provider: Conard NovakStephen D Brendan Gruwell, MD Discharge date: 05/09/2016   Admission Diagnoses: Contractions/ IUP at 7965w0d weeks  Discharge Diagnoses:  IUP at 1365w0d with false labor vs prodromal labor  Consults: None  Significant Findings/ Diagnostic Studies: 25 year old G1 P0 with EDC=05/23/2016 who presented this Am at 4465w0d weeks with complaints of frequent contractions since yesterday late afternoon. Her contractions decreased in intensity and frequency while at the hospital.  Her cervical exam was called 4.5cm overnight by RN. However, on my exam this morning, her exam was more consistent with 3-3.5cm.   FHR was reactive with baseline 145 and accelerations, moderate variability. She was discharged home with labor precautions.  Procedures: NST (described above)  Discharge Condition: stable  Disposition: 01-Home or Self Care  Diet: Regular diet  Discharge Activity: Activity as tolerated     Medication List    ASK your doctor about these medications        acetaminophen 500 MG tablet  Commonly known as:  TYLENOL  Take 1,000 mg by mouth every 6 (six) hours as needed (back and abdominal pain).     PRENATAL VITAMIN PO  Take 1 tablet by mouth daily.       Total time spent taking care of this patient: 30 minutes  Signed: Conard NovakJackson, Helayne Metsker D 05/09/2016, 8:33 AM

## 2016-05-12 NOTE — Discharge Summary (Signed)
Patient presented for evaluation of labor.  Patient had cervical exam by RN and this was reported to me. I reviewed her vital signs and fetal tracing, both of which were reassuring.  Patient was discharged as she was not laboring.  She was given medication, food, and time to rest for her headache which resolved prior to discharge.  NST interpretation: Reactive.  Ranae Plumberhelsea Kellis Mcadam, MD Attending Obstetrician and Gynecologist [facility]

## 2016-05-13 ENCOUNTER — Observation Stay
Admission: EM | Admit: 2016-05-13 | Discharge: 2016-05-13 | Disposition: A | Discharge status: Home / Self Care | Payer: BLUE CROSS/BLUE SHIELD | Attending: Certified Nurse Midwife | Admitting: Certified Nurse Midwife

## 2016-05-13 DIAGNOSIS — E860 Dehydration: Secondary | ICD-10-CM | POA: Insufficient documentation

## 2016-05-13 DIAGNOSIS — O471 False labor at or after 37 completed weeks of gestation: Secondary | ICD-10-CM | POA: Diagnosis not present

## 2016-05-13 DIAGNOSIS — Z3A38 38 weeks gestation of pregnancy: Secondary | ICD-10-CM | POA: Diagnosis not present

## 2016-05-13 DIAGNOSIS — O99613 Diseases of the digestive system complicating pregnancy, third trimester: Secondary | ICD-10-CM | POA: Insufficient documentation

## 2016-05-13 DIAGNOSIS — R112 Nausea with vomiting, unspecified: Secondary | ICD-10-CM | POA: Diagnosis not present

## 2016-05-13 DIAGNOSIS — Z888 Allergy status to other drugs, medicaments and biological substances status: Secondary | ICD-10-CM | POA: Diagnosis not present

## 2016-05-13 DIAGNOSIS — O219 Vomiting of pregnancy, unspecified: Secondary | ICD-10-CM | POA: Diagnosis present

## 2016-05-13 LAB — COMPREHENSIVE METABOLIC PANEL
ALBUMIN: 2.6 g/dL — AB (ref 3.5–5.0)
ALK PHOS: 210 U/L — AB (ref 38–126)
ALT: 37 U/L (ref 14–54)
AST: 34 U/L (ref 15–41)
Anion gap: 9 (ref 5–15)
BILIRUBIN TOTAL: 0.9 mg/dL (ref 0.3–1.2)
BUN: 6 mg/dL (ref 6–20)
CALCIUM: 8.4 mg/dL — AB (ref 8.9–10.3)
CO2: 22 mmol/L (ref 22–32)
Chloride: 104 mmol/L (ref 101–111)
Creatinine, Ser: 0.62 mg/dL (ref 0.44–1.00)
GFR calc Af Amer: >60 mL/min (ref >60)
GFR calc non Af Amer: >60 mL/min (ref >60)
GLUCOSE: 81 mg/dL (ref 65–99)
Potassium: 3.8 mmol/L (ref 3.5–5.1)
SODIUM: 135 mmol/L (ref 135–145)
TOTAL PROTEIN: 6.4 g/dL — AB (ref 6.5–8.1)

## 2016-05-13 LAB — URINALYSIS COMPLETE WITH MICROSCOPIC (ARMC ONLY)
BILIRUBIN URINE: NEGATIVE
GLUCOSE, UA: NEGATIVE mg/dL
Hgb urine dipstick: NEGATIVE
LEUKOCYTES UA: NEGATIVE
Nitrite: NEGATIVE
Protein, ur: 30 mg/dL — AB
Specific Gravity, Urine: 1.025 (ref 1.005–1.030)
pH: 5 (ref 5.0–8.0)

## 2016-05-13 LAB — CBC WITH DIFFERENTIAL/PLATELET
Basophils Absolute: 0 10*3/uL (ref 0–0.1)
EOS ABS: 0 10*3/uL (ref 0–0.7)
Eosinophils Relative: 0 %
HEMATOCRIT: 35.1 % (ref 35.0–47.0)
HEMOGLOBIN: 11.7 g/dL — AB (ref 12.0–16.0)
LYMPHS ABS: 1.1 10*3/uL (ref 1.0–3.6)
Lymphocytes Relative: 11 %
MCH: 26.7 pg (ref 26.0–34.0)
MCHC: 33.4 g/dL (ref 32.0–36.0)
MCV: 79.9 fL — ABNORMAL LOW (ref 80.0–100.0)
MONO ABS: 0.6 10*3/uL (ref 0.2–0.9)
Neutro Abs: 8.5 10*3/uL — ABNORMAL HIGH (ref 1.4–6.5)
Platelets: 324 10*3/uL (ref 150–440)
RBC: 4.39 MIL/uL (ref 3.80–5.20)
RDW: 14.2 % (ref 11.5–14.5)
WBC: 10.2 10*3/uL (ref 3.6–11.0)

## 2016-05-13 LAB — LIPASE, BLOOD: Lipase: 16 U/L (ref 11–51)

## 2016-05-13 MED ORDER — METOCLOPRAMIDE HCL 5 MG/ML IJ SOLN
10.0000 mg | Freq: Once | INTRAMUSCULAR | Status: AC
  Administered 2016-05-13: 10 mg via INTRAVENOUS
  Filled 2016-05-13: qty 2

## 2016-05-13 MED ORDER — ACETAMINOPHEN 325 MG PO TABS: 650.0000 mg | ORAL_TABLET | ORAL | Status: DC | PRN

## 2016-05-13 MED ORDER — LACTATED RINGERS IV BOLUS (SEPSIS)
1500.0000 mL | Freq: Once | INTRAVENOUS | Status: AC
  Administered 2016-05-13 (×2): 1000 mL via INTRAVENOUS

## 2016-05-13 MED ORDER — ONDANSETRON HCL 4 MG/2ML IJ SOLN: 4.0000 mg | Freq: Four times a day (QID) | INTRAMUSCULAR | Status: DC | PRN

## 2016-05-13 MED ORDER — PROMETHAZINE HCL 12.5 MG PO TABS: 12.5000 mg | ORAL_TABLET | Freq: Four times a day (QID) | ORAL | Status: DC | PRN

## 2016-05-13 NOTE — OB Triage Note (Signed)
Pt states onset of nausea and vomiting approx 2300 05/17. Reports a "few" episodes of vomiting since, nausea continues. Noted 101.0 by mouth at home today. Pt urine speciman in triage orange colored. Pt unable to relate to number of times she has voided today. Diarrhea started aapprox 22300 last evening, approx 4 times. Pt appears as poor historian. States baby active, but not as much as usual. Pt reports when asked tat noone else ill in househild, and that diet has not changed. Pt has tried Automatic Dataator Aid, H2o, and a few Saltine Crackers today 1300, has been able to keep such down

## 2016-05-13 NOTE — Progress Notes (Signed)
Sudden c/o's of feeling very warm

## 2016-05-13 NOTE — Progress Notes (Signed)
Pt states that she feels better, nausea relieved

## 2016-05-13 NOTE — OB Triage Note (Signed)
Pt states that at 0900 took a suppository for nausea, does not know name- states it did not help. Originally prescribed by her Doctor when she was pregnant and ill

## 2016-05-13 NOTE — Progress Notes (Signed)
Dr Vergie LivingPickens informed of pt's arrival to triage- states he has been given report by "Erskine SquibbJane" from office. Given update. Dr Lavonia DraftsPickens staes he has recently entered orders already.

## 2016-05-13 NOTE — Final Progress Note (Signed)
Obstetrics Admission History & Physical  05/13/2016 - 7:35 PM Primary OBGYN: [facility]  Chief Complaint: nausea, vomiting, diarrhea  History of Present Illness  25 y.o. G1 @ 4840w4d , with the above CC. Pregnancy complicated by nothing.  [name] states that she had GI s/s and has felt dehydrated and unable to keep anything down. Pt sent from the office for fluids and fetal tachycardia into the 160s-70s. Normal OB Triage CBC, lipase, CMP and AF VS normal and stable. Pt received 1.5L IVFs and IV reglan for nausea and slight HA. No OB s/s and currently no fevers, chills, nausea, vomiting and pt states she feels better.  No sick contacts or new foods.   Review of Systems: Pher 12 point review of systems is negative or as noted in the History of Present Illness.   PMHx:  Past Medical History  Diagnosis Date  . Migraines    PSHx:  Past Surgical History  Procedure Laterality Date  . Knee surgery Left   . Foot surgery Left   . Cholecystectomy     Medications:  Prescriptions prior to admission  Medication Sig Dispense Refill Last Dose  . acetaminophen (TYLENOL) 500 MG tablet Take 1,000 mg by mouth every 6 (six) hours as needed (back and abdominal pain).   05/13/2016 at 11100  . Prenatal Vit-Fe Fumarate-FA (PRENATAL VITAMIN PO) Take 1 tablet by mouth daily.   05/12/2016 at 1200     Allergies: is allergic to robitussin (alcohol free). OBHx:  OB History  Gravida Para Term Preterm AB SAB TAB Ectopic Multiple Living  1             # Outcome Date GA Lbr Len/2nd Weight Sex Delivery Anes PTL Lv  1 Current                    FHx:  Family History  Problem Relation Age of Onset  . Migraines Mother   . Cancer Maternal Grandmother   . Cancer Maternal Grandfather    Soc Hx:  Social History   Social History  . Marital Status: Single    Spouse Name: N/A  . Number of Children: N/A  . Years of Education: N/A   Occupational History  . Not on file.   Social History Main Topics   . Smoking status: Never Smoker   . Smokeless tobacco: Never Used  . Alcohol Use: No     Comment: occasional, while not pregnant  . Drug Use: No  . Sexual Activity: Yes     Comment: 04/08/16   Other Topics Concern  . Not on file   Social History Narrative    Objective   Filed Vitals:   05/13/16 1801 05/13/16 1903  BP: 125/77 117/84  Pulse: 101 108  Temp:    Resp: 18 20    Current Vital Signs 24h Vital Sign Ranges  T 98.7 F (37.1 C) Temp  Avg: 98.7 F (37.1 C)  Min: 98.7 F (37.1 C)  Max: 98.7 F (37.1 C)  BP 117/84 mmHg BP  Min: 117/84  Max: 125/77  HR (!) 108 Pulse  Avg: 110.7  Min: 101  Max: 123  RR 20 Resp  Avg: 19.3  Min: 18  Max: 20  SaO2     No Data Recorded       24 Hour I/O Current Shift I/O  Time Ins Outs       EFM: initially tachy into the 170s but has been 150-155 baseline, +accels,  no decels, mod var for over an hour. Patient always had mod var and accels and no decels  Toco: occasional UCs  General: Well nourished, well developed female resting comfortably Skin:  Warm and dry.  Cardiovascular: S1, S2 normal, no murmur, rub or gallop, regular rate and rhythm Respiratory:  Clear to auscultation bilateral. Normal respiratory effort Abdomen: gravid, nttp Neuro/Psych:  Normal mood and affect.   SSE: deferred  Labs   Recent Labs Lab 05/13/16 1831  WBC 10.2  HGB 11.7*  HCT 35.1  PLT 324     Recent Labs Lab 05/13/16 1831  NA 135  K 3.8  CL 104  CO2 22  BUN 6  CREATININE 0.62  CALCIUM 8.4*  PROT 6.4*  BILITOT 0.9  ALKPHOS 210*  ALT 37  AST 34  GLUCOSE 81   Lipase 18  U/a c/w dehydration   Radiology none   Assessment & Plan   25 y.o. G1P0 @ [redacted]w[redacted]d with GI and dehydration. Pt currently much improved *IUP: category I with accels *GI: pt on MIVF @ 137mL/hr and left; will run that in wide open and then d/c to home with PO PRNs. Pt advised to stay well hydrated, avoid sports drinks and do PRN pedialyte   Cornelia Copa. MD [facility] Pager [phone]

## 2016-05-13 NOTE — OB Triage Note (Signed)
FHT reactive, FHR improved, no longer tachy, current FHR 155 bpm, VSS, afebrile, and EFM d'ced per Dr Vergie LivingPickens. Pt denies any pain, nausea, vomiting or diarrhea recently, tolerating po well. Discharge instructions and teaching completed with pt and s/o. Discharge paperwork including handouts related to labor precautions, rx for Phenergan tabs and diet to follow with nausea/vomiting in pregnancy provided to pt.  Encouraged pt to rest, drink plenty fluids to stay well hydrated and f/u with primary OB on Monday as scheduled. Pt is aware she may return to hospital with any worsening symptoms. Pt reports active fetal movement and n/v/d has improved prior to discharge.

## 2016-05-20 ENCOUNTER — Inpatient Hospital Stay: Payer: BLUE CROSS/BLUE SHIELD | Admitting: Anesthesiology

## 2016-05-20 ENCOUNTER — Inpatient Hospital Stay
Admission: EM | Admit: 2016-05-20 | Discharge: 2016-05-22 | DRG: 775 | Disposition: A | Discharge status: Home / Self Care | Payer: BLUE CROSS/BLUE SHIELD | Attending: Obstetrics & Gynecology | Admitting: Obstetrics & Gynecology

## 2016-05-20 DIAGNOSIS — O4202 Full-term premature rupture of membranes, onset of labor within 24 hours of rupture: Secondary | ICD-10-CM | POA: Diagnosis present

## 2016-05-20 DIAGNOSIS — Z3A39 39 weeks gestation of pregnancy: Secondary | ICD-10-CM

## 2016-05-20 DIAGNOSIS — O99824 Streptococcus B carrier state complicating childbirth: Secondary | ICD-10-CM | POA: Diagnosis present

## 2016-05-20 DIAGNOSIS — O368930 Maternal care for other specified fetal problems, third trimester, not applicable or unspecified: Secondary | ICD-10-CM

## 2016-05-20 DIAGNOSIS — O34219 Maternal care for unspecified type scar from previous cesarean delivery: Secondary | ICD-10-CM | POA: Diagnosis present

## 2016-05-20 DIAGNOSIS — O99210 Obesity complicating pregnancy, unspecified trimester: Secondary | ICD-10-CM

## 2016-05-20 DIAGNOSIS — Z888 Allergy status to other drugs, medicaments and biological substances status: Secondary | ICD-10-CM

## 2016-05-20 DIAGNOSIS — Z6833 Body mass index (BMI) 33.0-33.9, adult: Secondary | ICD-10-CM | POA: Diagnosis not present

## 2016-05-20 DIAGNOSIS — O99214 Obesity complicating childbirth: Secondary | ICD-10-CM | POA: Diagnosis present

## 2016-05-20 LAB — CBC
HEMATOCRIT: 36.2 % (ref 35.0–47.0)
HEMOGLOBIN: 11.9 g/dL — AB (ref 12.0–16.0)
MCH: 26.3 pg (ref 26.0–34.0)
MCHC: 33 g/dL (ref 32.0–36.0)
MCV: 79.8 fL — AB (ref 80.0–100.0)
PLATELETS: 382 10*3/uL (ref 150–440)
RBC: 4.53 MIL/uL (ref 3.80–5.20)
RDW: 14.3 % (ref 11.5–14.5)
WBC: 12.3 10*3/uL — AB (ref 3.6–11.0)

## 2016-05-20 LAB — TYPE AND SCREEN
ABO/RH(D): O POS
Antibody Screen: NEGATIVE

## 2016-05-20 MED ORDER — EPHEDRINE 5 MG/ML INJ
5.0000 mg | Freq: Once | INTRAVENOUS | Status: AC
  Administered 2016-05-20: 5 mg via INTRAVENOUS
  Filled 2016-05-20 (×2): qty 10

## 2016-05-20 MED ORDER — OXYTOCIN 10 UNIT/ML IJ SOLN
INTRAMUSCULAR | Status: AC
  Filled 2016-05-20: qty 2

## 2016-05-20 MED ORDER — EPHEDRINE 5 MG/ML INJ
10.0000 mg | INTRAVENOUS | Status: DC | PRN
  Filled 2016-05-20: qty 2

## 2016-05-20 MED ORDER — BUTORPHANOL TARTRATE 1 MG/ML IJ SOLN: 1.0000 mg | INTRAMUSCULAR | Status: DC | PRN

## 2016-05-20 MED ORDER — FENTANYL 2.5 MCG/ML W/ROPIVACAINE 0.2% IN NS 100 ML EPIDURAL INFUSION (ARMC-ANES)
EPIDURAL | Status: AC
  Administered 2016-05-20: 10 mL/h via EPIDURAL
  Filled 2016-05-20: qty 100

## 2016-05-20 MED ORDER — LIDOCAINE HCL (PF) 1 % IJ SOLN: 30.0000 mL | INTRAMUSCULAR | Status: DC | PRN

## 2016-05-20 MED ORDER — LIDOCAINE-EPINEPHRINE (PF) 1.5 %-1:200000 IJ SOLN
INTRAMUSCULAR | Status: DC | PRN
  Administered 2016-05-20: 3 mL via PERINEURAL

## 2016-05-20 MED ORDER — MISOPROSTOL 200 MCG PO TABS
ORAL_TABLET | ORAL | Status: DC
Start: 2016-05-20 — End: 2016-05-21
  Filled 2016-05-20: qty 4

## 2016-05-20 MED ORDER — PHENYLEPHRINE 40 MCG/ML (10ML) SYRINGE FOR IV PUSH (FOR BLOOD PRESSURE SUPPORT)
80.0000 ug | PREFILLED_SYRINGE | INTRAVENOUS | Status: DC | PRN
  Filled 2016-05-20: qty 5

## 2016-05-20 MED ORDER — LACTATED RINGERS IV SOLN: 500.0000 mL | Freq: Once | INTRAVENOUS | Status: DC

## 2016-05-20 MED ORDER — ONDANSETRON HCL 4 MG/2ML IJ SOLN: 4.0000 mg | Freq: Four times a day (QID) | INTRAMUSCULAR | Status: DC | PRN

## 2016-05-20 MED ORDER — DIPHENHYDRAMINE HCL 50 MG/ML IJ SOLN
12.5000 mg | INTRAMUSCULAR | Status: DC | PRN
Start: 2016-05-20 — End: 2016-05-21

## 2016-05-20 MED ORDER — LACTATED RINGERS IV SOLN
500.0000 mL | INTRAVENOUS | Status: DC | PRN
Start: 2016-05-20 — End: 2016-05-21

## 2016-05-20 MED ORDER — SOD CITRATE-CITRIC ACID 500-334 MG/5ML PO SOLN: 30.0000 mL | ORAL | Status: DC | PRN

## 2016-05-20 MED ORDER — LACTATED RINGERS IV SOLN
INTRAVENOUS | Status: DC
  Administered 2016-05-20 (×2): via INTRAVENOUS

## 2016-05-20 MED ORDER — OXYTOCIN 40 UNITS IN LACTATED RINGERS INFUSION - SIMPLE MED
1.0000 m[IU]/min | INTRAVENOUS | Status: DC
  Administered 2016-05-20: 2 m[IU]/min via INTRAVENOUS
  Filled 2016-05-20: qty 1000

## 2016-05-20 MED ORDER — OXYTOCIN 40 UNITS IN LACTATED RINGERS INFUSION - SIMPLE MED
2.5000 [IU]/h | INTRAVENOUS | Status: DC
  Administered 2016-05-21: 2.5 [IU]/h via INTRAVENOUS
  Filled 2016-05-20: qty 1000

## 2016-05-20 MED ORDER — FENTANYL 2.5 MCG/ML W/ROPIVACAINE 0.2% IN NS 100 ML EPIDURAL INFUSION (ARMC-ANES)
10.0000 mL/h | EPIDURAL | Status: DC
  Administered 2016-05-20: 10 mL/h via EPIDURAL
  Administered 2016-05-21: 20 mL/h via EPIDURAL
  Filled 2016-05-20: qty 100

## 2016-05-20 MED ORDER — LIDOCAINE HCL (PF) 1 % IJ SOLN
INTRAMUSCULAR | Status: AC
  Filled 2016-05-20: qty 30

## 2016-05-20 MED ORDER — OXYTOCIN BOLUS FROM INFUSION
500.0000 mL | INTRAVENOUS | Status: DC
  Administered 2016-05-21: 500 mL via INTRAVENOUS

## 2016-05-20 MED ORDER — TERBUTALINE SULFATE 1 MG/ML IJ SOLN: 0.2500 mg | Freq: Once | INTRAMUSCULAR | Status: DC | PRN

## 2016-05-20 MED ORDER — DEXTROSE 5 % IV SOLN
2.5000 10*6.[IU] | INTRAVENOUS | Status: DC
  Administered 2016-05-20 – 2016-05-21 (×3): 2.5 10*6.[IU] via INTRAVENOUS
  Filled 2016-05-20 (×13): qty 2.5

## 2016-05-20 MED ORDER — PENICILLIN G POTASSIUM 5000000 UNITS IJ SOLR
5.0000 10*6.[IU] | Freq: Once | INTRAVENOUS | Status: AC
  Administered 2016-05-20: 5 10*6.[IU] via INTRAVENOUS
  Filled 2016-05-20: qty 5

## 2016-05-20 MED ORDER — AMMONIA AROMATIC IN INHA
RESPIRATORY_TRACT | Status: DC
Start: 2016-05-20 — End: 2016-05-21
  Filled 2016-05-20: qty 10

## 2016-05-20 MED ORDER — ACETAMINOPHEN 325 MG PO TABS
650.0000 mg | ORAL_TABLET | ORAL | Status: DC | PRN
  Administered 2016-05-21: 650 mg via ORAL
  Filled 2016-05-20: qty 2

## 2016-05-20 NOTE — Progress Notes (Signed)
Intrapartum progress note  Patient feeling mild contractions, comfortable  116/73  94  97.4  16  99% RA  SVE: 5/90/-1 AROM'd forebag for copious light green fluid with some particulates (minimal) TOCO: 2-563min FHT 155 mod + accels no decels  A?P: 24yo G1P0 @ 39.4 with meconium PROM, labor  1. Continue pitocin for active management after PROM 2. Epidural upon request 3. Fetal status: category 1 strip 4. GBS s/p 5million u PCN, continue 2.5Mu q4h until delivery.  ----- Ranae Plumberhelsea Dawood Spitler, MD Attending Obstetrician and Gynecologist [facility]

## 2016-05-20 NOTE — Anesthesia Procedure Notes (Signed)
Epidural Patient location during procedure: OB Start time: 05/20/2016 9:08 PM End time: 05/20/2016 9:32 PM  Preanesthetic Checklist Completed: patient identified, site marked, surgical consent, pre-op evaluation, timeout performed, IV checked, risks and benefits discussed and monitors and equipment checked  Epidural Patient position: sitting Prep: Betadine Patient monitoring: heart rate, continuous pulse ox and blood pressure Approach: midline Location: L4-L5 Injection technique: LOR saline  Needle:  Needle type: Tuohy  Needle gauge: 18 G Needle length: 9 cm and 9 Catheter type: closed end flexible Catheter size: 20 Guage Test dose: negative and 1.5% lidocaine with Epi 1:200 K  Assessment Events: blood not aspirated, injection not painful, no injection resistance, negative IV test and no paresthesia  Additional Notes   Patient tolerated the insertion well without complications.Reason for block:procedure for pain

## 2016-05-20 NOTE — H&P (Signed)
OB History & Physical   History of Present Illness:  Chief Complaint:   HPI:  Shannon [name] is a 25 y.o. G1P0 female at 6979w4d dated by 6wk US with EDC of 05/23/16.  She presents to L&D with complaints of leaking green fluid  +FM, occasional CTX, + LOF, no VB  Pregnancy Issues: 1. Obese, pre-pregnancy BMI 33 2. GBS positive   Maternal Medical History:   Past Medical History  Diagnosis Date  . Migraines     Past Surgical History  Procedure Laterality Date  . Knee surgery Left   . Foot surgery Left   . Cholecystectomy      Allergies  Allergen Reactions  . Robitussin (Alcohol Free) [Guaifenesin] Itching    Took Benadryl, helped    Prior to Admission medications   Medication Sig Start Date End Date Taking? Authorizing Provider  acetaminophen (TYLENOL) 500 MG tablet Take 1,000 mg by mouth every 6 (six) hours as needed (back and abdominal pain).    Historical Provider, MD  Prenatal Vit-Fe Fumarate-FA (PRENATAL VITAMIN PO) Take 1 tablet by mouth daily.    Historical Provider, MD  promethazine (PHENERGAN) 12.5 MG tablet Take 1-2 tablets (12.5-25 mg total) by mouth every 6 (six) hours as needed for nausea or vomiting. 05/13/16   Campbell Bingharlie Pickens, MD     [facility]   Social History: She  reports that she has never smoked. She has never used smokeless tobacco. She reports that she does not drink alcohol or use illicit drugs.  Family History: family history includes Cancer in her maternal grandfather and maternal grandmother; Migraines in her mother.   Review of Systems: Negative x 10 systems reviewed except as noted in the HPI.     Physical Exam:  Vital Signs: BP 122/79 mmHg  Pulse 83  Temp(Src) 97.8 F (36.6 C) (Oral)  Resp 18  SpO2 99%  LMP 08/12/2015 General: no acute distress.  HEENT: normocephalic, atraumatic Heart: regular rate & rhythm.  No murmurs/rubs/gallops Lungs: clear to auscultation bilaterally, normal respiratory effort Abdomen:  soft, gravid, non-tender;  EFW: 8.2 Pelvic:   External: Normal external female genitalia  Cervix: Dilation: 4 / Effacement (%): 90 / Station: -1    Extremities: non-tender, symmetric, 2+ edema bilaterally.  DTRs: 2+ Neurologic: Alert & oriented x 3.    No results found for this or any previous visit (from the past 24 hour(s)).  Pertinent Results:  Prenatal Labs: Blood type/Rh O+  Antibody screen neg  Rubella Immune  Varicella Immune  RPR NR  HBsAg Neg  HIV NR  GC neg  Chlamydia neg  Genetic screening negative  1 hour GTT 141  3 hour GTT WNL  GBS positive   FHT: 155 mod + accels no decels TOCO: irreg q2-176min SVE: 4/90/-1   Cephalic by leopolds  Assessment:  Shannon [name] is a 25 y.o. G1P0 female at 5679w4d with meconium stained PROM.   Plan:  1. Admit to Labor & Delivery 2. CBC, T&S, Clrs, IVF 3. GBS  Pos - PCN 4. Consents obtained. 5. Continuous efm/toco 6. Early labor with meconium staining, start pitocin.  Epidural when desired OK.  Pediatric team notified.  ----- Ranae Plumberhelsea Nefertiti Mohamad, MD Attending Obstetrician and Gynecologist [facility]

## 2016-05-20 NOTE — Anesthesia Preprocedure Evaluation (Signed)
Anesthesia Evaluation  Patient identified by MRN, date of birth, ID band Patient awake    Reviewed: Allergy & Precautions, NPO status , Patient's Chart, lab work & pertinent test results  History of Anesthesia Complications Negative for: history of anesthetic complications  Airway Mallampati: II       Dental   Pulmonary neg pulmonary ROS,           Cardiovascular negative cardio ROS       Neuro/Psych negative neurological ROS  negative psych ROS   GI/Hepatic negative GI ROS, Neg liver ROS, GERD  Medicated,  Endo/Other  negative endocrine ROS  Renal/GU negative Renal ROS  negative genitourinary   Musculoskeletal   Abdominal   Peds negative pediatric ROS (+)  Hematology negative hematology ROS (+)   Anesthesia Other Findings   Reproductive/Obstetrics (+) Pregnancy                             Anesthesia Physical Anesthesia Plan  ASA: II  Anesthesia Plan: Epidural   Post-op Pain Management:    Induction:   Airway Management Planned:   Additional Equipment:   Intra-op Plan:   Post-operative Plan:   Informed Consent: I have reviewed the patients History and Physical, chart, labs and discussed the procedure including the risks, benefits and alternatives for the proposed anesthesia with the patient or authorized representative who has indicated his/her understanding and acceptance.     Plan Discussed with:   Anesthesia Plan Comments:         Anesthesia Quick Evaluation

## 2016-05-20 NOTE — OB Triage Note (Signed)
Patient complaining of leaking green fluid since 1330.  Denies vaginal bleeding or pain.

## 2016-05-21 ENCOUNTER — Encounter: Payer: Self-pay | Admitting: *Deleted

## 2016-05-21 LAB — RPR: RPR: NONREACTIVE

## 2016-05-21 MED ORDER — DIPHENHYDRAMINE HCL 25 MG PO CAPS: 25.0000 mg | ORAL_CAPSULE | Freq: Four times a day (QID) | ORAL | Status: DC | PRN

## 2016-05-21 MED ORDER — PRENATAL MULTIVITAMIN CH
1.0000 | ORAL_TABLET | Freq: Every day | ORAL | Status: DC
  Administered 2016-05-21 – 2016-05-22 (×2): 1 via ORAL
  Filled 2016-05-21 (×2): qty 1

## 2016-05-21 MED ORDER — ONDANSETRON HCL 4 MG/2ML IJ SOLN: 4.0000 mg | INTRAMUSCULAR | Status: DC | PRN

## 2016-05-21 MED ORDER — SIMETHICONE 80 MG PO CHEW: 80.0000 mg | CHEWABLE_TABLET | ORAL | Status: DC | PRN

## 2016-05-21 MED ORDER — COCONUT OIL OIL
1.0000 "application " | TOPICAL_OIL | Status: DC | PRN
  Administered 2016-05-21: 1 via TOPICAL
  Filled 2016-05-21: qty 120

## 2016-05-21 MED ORDER — WITCH HAZEL-GLYCERIN EX PADS: 1.0000 "application " | MEDICATED_PAD | CUTANEOUS | Status: DC | PRN

## 2016-05-21 MED ORDER — ACETAMINOPHEN 325 MG PO TABS
650.0000 mg | ORAL_TABLET | ORAL | Status: DC | PRN
Start: 2016-05-21 — End: 2016-05-23

## 2016-05-21 MED ORDER — DOCUSATE SODIUM 100 MG PO CAPS
100.0000 mg | ORAL_CAPSULE | Freq: Two times a day (BID) | ORAL | Status: DC | PRN
  Administered 2016-05-21: 100 mg via ORAL
  Filled 2016-05-21: qty 1

## 2016-05-21 MED ORDER — OXYCODONE-ACETAMINOPHEN 5-325 MG PO TABS: 1.0000 | ORAL_TABLET | ORAL | Status: DC | PRN

## 2016-05-21 MED ORDER — DIBUCAINE 1 % RE OINT: 1.0000 "application " | TOPICAL_OINTMENT | RECTAL | Status: DC | PRN

## 2016-05-21 MED ORDER — OXYTOCIN 40 UNITS IN LACTATED RINGERS INFUSION - SIMPLE MED
2.5000 [IU]/h | INTRAVENOUS | Status: DC | PRN
  Filled 2016-05-21: qty 1000

## 2016-05-21 MED ORDER — SENNOSIDES-DOCUSATE SODIUM 8.6-50 MG PO TABS: 1.0000 | ORAL_TABLET | Freq: Every evening | ORAL | Status: DC | PRN

## 2016-05-21 MED ORDER — ONDANSETRON HCL 4 MG PO TABS
4.0000 mg | ORAL_TABLET | ORAL | Status: DC | PRN
Start: 2016-05-21 — End: 2016-05-23

## 2016-05-21 MED ORDER — BENZOCAINE-MENTHOL 20-0.5 % EX AERO: 1.0000 "application " | INHALATION_SPRAY | CUTANEOUS | Status: DC | PRN

## 2016-05-21 MED ORDER — IBUPROFEN 600 MG PO TABS
600.0000 mg | ORAL_TABLET | Freq: Four times a day (QID) | ORAL | Status: DC
  Administered 2016-05-21 – 2016-05-22 (×6): 600 mg via ORAL
  Filled 2016-05-21 (×6): qty 1

## 2016-05-21 NOTE — Progress Notes (Signed)
RN to the bedside to assist patient up to BR for post delivery void.  Pt. Voided large amount of urine, pericare given, clean iperipad and ice pack put in place.  Pain 0/10.  Pt[facility]. FOB at the bedside.

## 2016-05-21 NOTE — Discharge Summary (Signed)
Obstetrical Discharge Summary  Date of Admission: 05/20/2016 Date of Discharge: 05/22/2016  Primary OB: [facility]  Gestational Age at Delivery: [id number]   Antepartum complications: BMI 33, GBS positive Reason for Admission: SROM Date of Delivery: 05/21/16  Delivered By: Cornelia Copaharlie Pickens, Jr MD Delivery Type: spontaneous vaginal delivery Intrapartum complications/course: Thick meconium Anesthesia: epidural Placenta: Delivered and expressed via active management. Intact: yes. To pathology: no.  Laceration: right peri-uretheral repaired Episiotomy: none EBL: 250mL Baby: Liveborn female, APGARs 8/9, weight 3590 g.   Discharge Diagnosis: Term spontaneous vaginal delivery  Postpartum course: Uncomplicated postpartum course with patient opting for 24-hr discharge Discharge Vital Signs:  Current Vital Signs 24h Vital Sign Ranges  T 97.7 F (36.5 C) Temp  Avg: 97.9 F (36.6 C)  Min: 97.7 F (36.5 C)  Max: 98.3 F (36.8 C)  BP (!) 99/59 mmHg BP  Min: 99/59  Max: 115/69  HR 74 Pulse  Avg: 86.8  Min: 74  Max: 100  RR 18 Resp  Avg: 19  Min: 18  Max: 20  SaO2 99 % Not Delivered SpO2  Avg: 99.7 %  Min: 99 %  Max: 100 %       24 Hour I/O Current Shift I/O  Time Ins Outs 05/26 0701 - 05/27 0700 In: -  Out: 2400 [Urine:2150]      Discharge Exam:  NAD Abdomen: firm fundus below the umbilicus. RRR no MRGs CTAB Ext: no c/c/e  Disposition: Home  Rh Immune globulin given: not applicable Rubella vaccine given: not applicable Tdap vaccine given in AP or PP setting: yes Flu vaccine given in AP or PP setting: no  Contraception: Mini pill  Prenatal/Postnatal Panel: O POS//Rubella Immune//Varicella Immune//RPR negative//HIV negative/HepB Surface Ag negative//pap no abnormalities (date: 2016)//plans to breastfeed  Plan:  Shannon [name] was discharged to home in good condition. Follow-up appointment with Shannon [name] in 6  weeks for a PP visit  Discharge Medications:   Medication List     STOP taking these medications        acetaminophen 500 MG tablet  Commonly known as:  TYLENOL     promethazine 12.5 MG tablet  Commonly known as:  PHENERGAN      TAKE these medications        ibuprofen 600 MG tablet  Commonly known as:  ADVIL,MOTRIN  Take 1 tablet (600 mg total) by mouth every 6 (six) hours.     norethindrone 0.35 MG tablet  Commonly known as:  MICRONOR,CAMILA,ERRIN  Take 1 tablet (0.35 mg total) by mouth daily.     oxyCODONE-acetaminophen 5-325 MG tablet  Commonly known as:  PERCOCET/ROXICET  Take 1 tablet by mouth every 4 (four) hours as needed for severe pain.     PRENATAL VITAMIN PO  Take 1 tablet by mouth daily.

## 2016-05-22 MED ORDER — OXYCODONE-ACETAMINOPHEN 5-325 MG PO TABS: 1.0000 | ORAL_TABLET | ORAL | Status: DC | PRN

## 2016-05-22 MED ORDER — NORETHINDRONE 0.35 MG PO TABS: 1.0000 | ORAL_TABLET | Freq: Every day | ORAL | Status: DC

## 2016-05-22 MED ORDER — IBUPROFEN 600 MG PO TABS: 600.0000 mg | ORAL_TABLET | Freq: Four times a day (QID) | ORAL | Status: DC

## 2016-05-22 NOTE — Anesthesia Post-op Follow-up Note (Signed)
  Anesthesia Pain Follow-up Note  Patient: Shannon [name]  Day #: 1  Date of Follow-up: 05/22/2016 Time: 8:36 AM  Last Vitals:  Filed Vitals:   05/22/16 0413 05/22/16 0759  BP: 105/62 99/59  Pulse: 84 74  Temp: 36.6 C 36.5 C  Resp: 20 18    Level of Consciousness: alert  Pain: mild   Side Effects:None  Catheter Site Exam:clean  Plan: D/C from anesthesia care  Cleda MccreedyJoseph K Piscitello

## 2016-05-22 NOTE — Discharge Instructions (Signed)
Vaginal Delivery, Care After Refer to this sheet in the next few weeks. These discharge instructions provide you with information on caring for yourself after delivery. Your caregiver may also give you specific instructions. Your treatment has been planned according to the most current medical practices available, but problems sometimes occur. Call your caregiver if you have any problems or questions after you go home. HOME CARE INSTRUCTIONS 1. Take over-the-counter or prescription medicines only as directed by your caregiver or pharmacist. 2. Do not drink alcohol, especially if you are breastfeeding or taking medicine to relieve pain. 3. Do not smoke tobacco. 4. Continue to use good perineal care. Good perineal care includes: 1. Wiping your perineum from back to front 2. Keeping your perineum clean. 3. You can do sitz baths twice a day, to help keep this area clean 5. Do not use tampons, douche or have sex until your caregiver says it is okay. 6. Shower only and avoid sitting in submerged water, aside from sitz baths 7. Wear a well-fitting bra that provides breast support. 8. Eat healthy foods. 9. Drink enough fluids to keep your urine clear or pale yellow. 10. Eat high-fiber foods such as whole grain cereals and breads, brown rice, beans, and fresh fruits and vegetables every day. These foods may help prevent or relieve constipation. 11. Avoid constipation with high fiber foods or medications, such as miralax or metamucil 12. Follow your caregiver's recommendations regarding resumption of activities such as climbing stairs, driving, lifting, exercising, or traveling. 13. Talk to your caregiver about resuming sexual activities. Resumption of sexual activities is dependent upon your risk of infection, your rate of healing, and your comfort and desire to resume sexual activity. 14. Try to have someone help you with your household activities and your newborn for at least a few days after you leave  the hospital. 15. Rest as much as possible. Try to rest or take a nap when your newborn is sleeping. 16. Increase your activities gradually. 17. Keep all of your scheduled postpartum appointments. It is very important to keep your scheduled follow-up appointments. At these appointments, your caregiver will be checking to make sure that you are healing physically and emotionally. SEEK MEDICAL CARE IF:   You are passing large clots from your vagina. Save any clots to show your caregiver.  You have a foul smelling discharge from your vagina.  You have trouble urinating.  You are urinating frequently.  You have pain when you urinate.  You have a change in your bowel movements.  You have increasing redness, pain, or swelling near your vaginal incision (episiotomy) or vaginal tear.  You have pus draining from your episiotomy or vaginal tear.  Your episiotomy or vaginal tear is separating.  You have painful, hard, or reddened breasts.  You have a severe headache.  You have blurred vision or see spots.  You feel sad or depressed.  You have thoughts of hurting yourself or your newborn.  You have questions about your care, the care of your newborn, or medicines.  You are dizzy or light-headed.  You have a rash.  You have nausea or vomiting.  You were breastfeeding and have not had a menstrual period within 12 weeks after you stopped breastfeeding.  You are not breastfeeding and have not had a menstrual period by the 12th week after delivery.  You have a fever. SEEK IMMEDIATE MEDICAL CARE IF:   You have persistent pain.  You have chest pain.  You have shortness of breath.    You faint.  You have leg pain.  You have stomach pain.  Your vaginal bleeding saturates two or more sanitary pads in 1 hour. MAKE SURE YOU:   Understand these instructions.  Will watch your condition.  Will get help right away if you are not doing well or get worse. Document Released:  12/10/2000 Document Revised: 04/29/2014 Document Reviewed: 08/09/2012 ExitCare Patient Information 2015 [employer]. This information is not intended to replace advice given to you by your health care provider. Make sure you discuss any questions you have with your health care provider.  Sitz Bath A sitz bath is a warm water bath taken in the sitting position. The water covers only the hips and butt (buttocks). We recommend using one that fits in the toilet, to help with ease of use and cleanliness. It may be used for either healing or cleaning purposes. Sitz baths are also used to relieve pain, itching, or muscle tightening (spasms). The water may contain medicine. Moist heat will help you heal and relax.  HOME CARE  Take 3 to 4 sitz baths a day. 18. Fill the bathtub half-full with warm water. 19. Sit in the water and open the drain a little. 20. Turn on the warm water to keep the tub half-full. Keep the water running constantly. 21. Soak in the water for 15 to 20 minutes. 22. After the sitz bath, pat the affected area dry. GET HELP RIGHT AWAY IF: You get worse instead of better. Stop the sitz baths if you get worse. MAKE SURE YOU:  Understand these instructions.  Will watch your condition.  Will get help right away if you are not doing well or get worse. Document Released: 01/20/2005 Document Revised: 09/06/2012 Document Reviewed: 04/12/2011 Exit[employer] Patient Information 2015 [employer]. This information is not intended to replace advice given to you by your health care provider. Make sure you discuss any questions you have with your health care provider.    

## 2016-05-22 NOTE — Anesthesia Postprocedure Evaluation (Signed)
Anesthesia Post Note  Patient: Shannon ComptonKarla A Topete  Procedure(s) Performed: * No procedures listed *  Patient location during evaluation: Mother Baby Anesthesia Type: Epidural Level of consciousness: awake and alert Pain management: pain level controlled Vital Signs Assessment: post-procedure vital signs reviewed and stable Respiratory status: spontaneous breathing, nonlabored ventilation and respiratory function stable Cardiovascular status: stable Postop Assessment: no headache, no backache and epidural receding Anesthetic complications: no    Last Vitals:  Filed Vitals:   05/22/16 0413 05/22/16 0759  BP: 105/62 99/59  Pulse: 84 74  Temp: 36.6 C 36.5 C  Resp: 20 18    Last Pain:  Filed Vitals:   05/22/16 0808  PainSc: 0-No pain                 Cleda MccreedyJoseph K Piscitello

## 2016-05-23 NOTE — Discharge Summary (Signed)
Reviewed D/C instructions with patient and significant other including when to call the MD, f/u appointment instructions, physical restrictions, prescriptions, and postpartum care at home.  Addressed patient questions as needed.  Obtained a signed copy of the D/C instructions for the patient file and provided a signed copy to the patient.  Discharged patient home via wheelchair escorted by nursing staff. 
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
## 2017-07-05 ENCOUNTER — Telehealth: Payer: Self-pay

## 2017-07-05 MED ORDER — NORETHINDRONE 0.35 MG PO TABS: 1.0000 | ORAL_TABLET | Freq: Every day | ORAL | 0 refills | Status: DC

## 2017-07-05 NOTE — Telephone Encounter (Signed)
Annual sched for 8/1 and pt needs refill of bcp to last until then.  Left detailed msg on vm refills sent to [facility](per [facility])

## 2017-07-27 ENCOUNTER — Ambulatory Visit (INDEPENDENT_AMBULATORY_CARE_PROVIDER_SITE_OTHER): Payer: BLUE CROSS/BLUE SHIELD | Admitting: Obstetrics and Gynecology

## 2017-07-27 ENCOUNTER — Encounter: Payer: Self-pay | Admitting: Obstetrics and Gynecology

## 2017-07-27 VITALS — BP 118/74 | Ht 63.0 in | Wt 186.0 lb

## 2017-07-27 DIAGNOSIS — Z30018 Encounter for initial prescription of other contraceptives: Secondary | ICD-10-CM

## 2017-07-27 DIAGNOSIS — Z113 Encounter for screening for infections with a predominantly sexual mode of transmission: Secondary | ICD-10-CM | POA: Diagnosis not present

## 2017-07-27 DIAGNOSIS — E669 Obesity, unspecified: Secondary | ICD-10-CM | POA: Insufficient documentation

## 2017-07-27 DIAGNOSIS — Z01419 Encounter for gynecological examination (general) (routine) without abnormal findings: Secondary | ICD-10-CM | POA: Diagnosis not present

## 2017-07-27 DIAGNOSIS — R102 Pelvic and perineal pain: Secondary | ICD-10-CM | POA: Diagnosis not present

## 2017-07-27 DIAGNOSIS — F331 Major depressive disorder, recurrent, moderate: Secondary | ICD-10-CM | POA: Diagnosis not present

## 2017-07-27 DIAGNOSIS — F329 Major depressive disorder, single episode, unspecified: Secondary | ICD-10-CM | POA: Insufficient documentation

## 2017-07-27 DIAGNOSIS — Z1331 Encounter for screening for depression: Secondary | ICD-10-CM

## 2017-07-27 DIAGNOSIS — E66811 Obesity, class 1: Secondary | ICD-10-CM

## 2017-07-27 DIAGNOSIS — Z124 Encounter for screening for malignant neoplasm of cervix: Secondary | ICD-10-CM | POA: Diagnosis not present

## 2017-07-27 DIAGNOSIS — Z1389 Encounter for screening for other disorder: Secondary | ICD-10-CM

## 2017-07-27 DIAGNOSIS — Z1339 Encounter for screening examination for other mental health and behavioral disorders: Secondary | ICD-10-CM

## 2017-07-27 MED ORDER — NORETHINDRONE 0.35 MG PO TABS: 1.0000 | ORAL_TABLET | Freq: Every day | ORAL | 1 refills | Status: DC

## 2017-07-27 MED ORDER — CITALOPRAM HYDROBROMIDE 10 MG PO TABS: 10.0000 mg | ORAL_TABLET | Freq: Every day | ORAL | 1 refills | Status: DC
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
## 2017-07-31 LAB — IGP, CTNG, RFX APTIMA HPV ASCU
Chlamydia, Nuc. Acid Amp: NEGATIVE
Gonococcus by Nucleic Acid Amp: NEGATIVE
PAP Smear Comment: 0

## 2017-08-22 ENCOUNTER — Ambulatory Visit (INDEPENDENT_AMBULATORY_CARE_PROVIDER_SITE_OTHER): Payer: BLUE CROSS/BLUE SHIELD | Admitting: Obstetrics and Gynecology

## 2017-08-22 ENCOUNTER — Encounter: Payer: Self-pay | Admitting: Obstetrics and Gynecology

## 2017-08-22 VITALS — BP 118/76 | Ht 63.0 in | Wt 186.0 lb

## 2017-08-22 DIAGNOSIS — Z30430 Encounter for insertion of intrauterine contraceptive device: Secondary | ICD-10-CM

## 2017-08-22 DIAGNOSIS — F331 Major depressive disorder, recurrent, moderate: Secondary | ICD-10-CM

## 2017-08-22 MED ORDER — LEVONORGESTREL 20 MCG/24HR IU IUD: INTRAUTERINE_SYSTEM | Freq: Once | INTRAUTERINE | Status: DC

## 2017-08-22 MED ORDER — CITALOPRAM HYDROBROMIDE 10 MG PO TABS: 10.0000 mg | ORAL_TABLET | Freq: Every day | ORAL | 5 refills | Status: DC

## 2017-08-22 NOTE — Progress Notes (Addendum)
Obstetrics & Gynecology Office Visit   Chief Complaint  Patient presents with  . Contraception  Medication follow up for depression, insertion of Mirena IUD  History of Present Illness: 26 y.o. G37P1001 female presents in follow up from starting medication for depression about 1 month ago.  She was started on Celexa and states that she is doing very well on the medication. She has had no side effects (did not tolerate Zoloft in the past as it did not allow her to sleep).  She denies SI/HI.  She is happy with the level of response she has gotten from this medication.  She is also here for an IUD placement. She has previously been counseled regarding the choices of contraception and chooses a Mirena IUD. She is on the last day of her menses now.   Past Medical History:  Diagnosis Date  . Depression   . Migraines   . Post partum depression     Past Surgical History:  Procedure Laterality Date  . CHOLECYSTECTOMY    . FOOT SURGERY Left   . KNEE SURGERY Left     Gynecologic History: Patient's last menstrual period was 08/17/2017.  Obstetric History: G1P1001  Family History  Problem Relation Age of Onset  . Migraines Mother   . Cancer Maternal Grandmother   . Cancer Maternal Grandfather     Social History   Social History  . Marital status: Single    Spouse name: N/A  . Number of children: N/A  . Years of education: N/A   Occupational History  . Not on file.   Social History Main Topics  . Smoking status: Never Smoker  . Smokeless tobacco: Never Used  . Alcohol use No     Comment: occasional, while not pregnant  . Drug use: No  . Sexual activity: Yes    Birth control/ protection: Pill     Comment: 04/08/16   Other Topics Concern  . Not on file   Social History Narrative  . No narrative on file    Allergies  Allergen Reactions  . Robitussin (Alcohol Free) [Guaifenesin] Itching    Took Benadryl, helped    Prior to Admission medications   Medication Sig  Start Date End Date Taking? Authorizing Provider  citalopram (CELEXA) 10 MG tablet Take 1 tablet (10 mg total) by mouth daily. 07/27/17  Yes Conard Novak, MD  norethindrone (MICRONOR,CAMILA,ERRIN) 0.35 MG tablet Take 1 tablet (0.35 mg total) by mouth daily. Patient not taking: Reported on 08/22/2017 07/27/17 08/24/17  Conard Novak, MD  Prenatal Vit-Fe Fumarate-FA (PRENATAL VITAMIN PO) Take 1 tablet by mouth daily.    [provider]    Review of Systems  Constitutional: Negative.   HENT: Negative.   Eyes: Negative.   Respiratory: Negative.   Cardiovascular: Negative.   Gastrointestinal: Negative.   Genitourinary: Negative.   Musculoskeletal: Negative.   Skin: Negative.   Neurological: Negative.   Psychiatric/Behavioral: Negative.      Physical Exam BP 118/76   Ht 5\' 3"  (1.6 m)   Wt 186 lb (84.4 kg)   LMP 08/17/2017   BMI 32.95 kg/m  Patient's last menstrual period was 08/17/2017. Physical Exam  Constitutional: She is oriented to person, place, and time. She appears well-developed and well-nourished. No distress.  Genitourinary: Vagina normal and uterus normal. Pelvic exam was performed with patient supine. There is no rash, tenderness or lesion on the right labia. There is no rash, tenderness or lesion on the left labia. Right adnexum does  not display mass, does not display tenderness and does not display fullness. Left adnexum does not display mass, does not display tenderness and does not display fullness. Cervix does not exhibit motion tenderness, lesion, discharge or polyp.   Uterus is mobile and anteverted. Uterus is not enlarged, tender, exhibiting a mass or irregular (is regular).  HENT:  Head: Normocephalic and atraumatic.  Eyes: Conjunctivae are normal. No scleral icterus.  Pulmonary/Chest: Effort normal.  Abdominal: Soft. Bowel sounds are normal. She exhibits no distension and no mass. There is no tenderness. There is no rebound and no guarding.    Musculoskeletal: Normal range of motion. She exhibits no edema.  Neurological: She is alert and oriented to person, place, and time.  Psychiatric: She has a normal mood and affect. Her behavior is normal. Judgment normal.   IUD Insertion Procedure Note Patient identified, informed consent performed, consent signed.   Discussed risks of irregular bleeding, cramping, infection, malpositioning, expulsion or uterine perforation of the IUD (1:1000 placements)  which may require further procedure such as laparoscopy.  IUD while effective at preventing pregnancy do not prevent transmission of sexually transmitted diseases and use of barrier methods for this purpose was discussed. Time out was performed.  Urine pregnancy test negative.  Speculum placed in the vagina.  Cervix visualized.  Cleaned with Betadine x 2.  Grasped anteriorly with a single tooth tenaculum.  Uterus sounded to 8 cm. IUD placed per manufacturer's recommendations.  Strings trimmed to 3 cm. Tenaculum was removed, good hemostasis noted.  Patient tolerated procedure well.   Patient was given post-procedure instructions.  She was advised to have backup contraception for one week.  Patient was also asked to check IUD strings periodically and follow up in 4 weeks for IUD check.  Female chaperone present for pelvic and breast  portions of the physical exam  Assessment: 26 y.o. G32P1001 female No problem-specific Assessment & Plan notes found for this encounter.   Plan: Problem List Items Addressed This Visit    Major depression - Primary   Relevant Medications   citalopram (CELEXA) 10 MG tablet    Other Visit Diagnoses    Encounter for insertion of mirena IUD       Relevant Medications   levonorgestrel (MIRENA) 20 MCG/24HR IUD (Start on 08/22/2017  6:15 PM)     Return in about 4 weeks (around 09/19/2017) for IUD String check, medication follow up.   Thomasene Mohair, MD 08/22/2017 6:13 PM

## 2017-09-21 ENCOUNTER — Encounter: Payer: Self-pay | Admitting: Obstetrics and Gynecology

## 2017-09-21 ENCOUNTER — Ambulatory Visit (INDEPENDENT_AMBULATORY_CARE_PROVIDER_SITE_OTHER): Payer: BLUE CROSS/BLUE SHIELD | Admitting: Obstetrics and Gynecology

## 2017-09-21 VITALS — BP 118/76 | Wt 184.0 lb

## 2017-09-21 DIAGNOSIS — Z30431 Encounter for routine checking of intrauterine contraceptive device: Secondary | ICD-10-CM

## 2017-09-21 DIAGNOSIS — F331 Major depressive disorder, recurrent, moderate: Secondary | ICD-10-CM

## 2017-09-21 NOTE — Progress Notes (Signed)
     IUD String Check  Subjctive: [name] presents for IUD string check.  She had a Mirena placed 4 weeks ago.  Since placement of her IUD she had minimal vaginal bleeding.  She denies cramping or discomfort.  She has had intercourse since placement.  She has not checked the strings.  She denies any fever, chills, nausea, vomiting, or other complaints.    She continues to do well on Celexa. Denies SI/HI.  No side effects.  Objective: BP 118/76   Wt 184 lb (83.5 kg)   LMP 09/07/2017   BMI 32.59 kg/m  Physical Exam  Constitutional: She is oriented to person, place, and time. She appears well-developed and well-nourished. No distress.  HENT:  Head: Normocephalic and atraumatic.  Nose: Nose normal.  Eyes: Conjunctivae are normal.  Neck: Normal range of motion.  Cardiovascular: Normal rate, regular rhythm and normal heart sounds.   Pulmonary/Chest: Effort normal and breath sounds normal.  Abdominal: Soft. She exhibits no distension. There is no tenderness. There is no rebound and no guarding.  Genitourinary: Uterus normal. Pelvic exam was performed with patient supine. There is no rash, tenderness or lesion on the right labia. There is no rash, tenderness or lesion on the left labia. Uterus is not tender. Cervix exhibits no motion tenderness and no discharge. Right adnexum displays no mass and no tenderness. Left adnexum displays no mass and no tenderness. No erythema or bleeding in the vagina. No signs of injury around the vagina. No vaginal discharge found.  Genitourinary Comments: IUD strings visible at 3cm length  Musculoskeletal: Normal range of motion.  Lymphadenopathy:       Right: No inguinal adenopathy present.       Left: No inguinal adenopathy present.  Neurological: She is alert and oriented to person, place, and time.  Skin: Skin is warm and dry. No rash noted.  Psychiatric: She has a normal mood and affect. Her behavior is normal.    Female chaperone was  present for the entirety of the pelvic exam  Assessment: 26 y.o. year old female status post prior Mirena IUD placement 4 week ago, doing well.  Plan: 1.  The patient was given instructions to check her IUD strings monthly and call with any problems or concerns.  She should call for fevers, chills, abnormal vaginal discharge, pelvic pain, or other complaints. 2.  She will return for depression medication follow up in 3 months  Thomasene Mohair, MD 09/21/2017 2:49 PM

## 2017-12-08 ENCOUNTER — Ambulatory Visit (INDEPENDENT_AMBULATORY_CARE_PROVIDER_SITE_OTHER): Payer: Managed Care, Other (non HMO) | Admitting: Obstetrics and Gynecology

## 2017-12-08 ENCOUNTER — Encounter: Payer: Self-pay | Admitting: Obstetrics and Gynecology

## 2017-12-08 VITALS — BP 122/70 | Ht 63.0 in | Wt 196.0 lb

## 2017-12-08 DIAGNOSIS — R102 Pelvic and perineal pain: Secondary | ICD-10-CM | POA: Diagnosis not present

## 2017-12-08 DIAGNOSIS — E669 Obesity, unspecified: Secondary | ICD-10-CM

## 2017-12-08 DIAGNOSIS — F3341 Major depressive disorder, recurrent, in partial remission: Secondary | ICD-10-CM | POA: Diagnosis not present

## 2017-12-08 MED ORDER — PHENTERMINE HCL 37.5 MG PO TABS: 37.5000 mg | ORAL_TABLET | Freq: Every day | ORAL | 0 refills | Status: DC

## 2017-12-08 NOTE — Progress Notes (Signed)
Gynecology Office Visit   Chief Complaint  Patient presents with  . Follow-up    might want IUD removed, but wants to discuss it more   History of Present Illness: Patientis a 26 y.o. 41P1001 female, who presents for the evaluation of weight gain. She has gained >10 pounds primarily over the past year. The patient states the following issues have contributed to her weight problem: sedentary lifestyle initially.  However, she has tried to adjust her lifestyle and has had difficulty losing weight.  The patient has no additional symptoms. The patient specifically denies memory loss, muscle weakness, excessive thirst, and polyuria. Weight related co-morbidities include none. The patient's past medical history is notable for depression, but this is in remission. She has tried exercise and dietary interventions in the past with mild-moderate success.   Depression: Her symptoms have improved markedly and she has weaned herself off the medication.  She has no side effects and feels great today. Her PHQ-9 today is 0.   As for pelvic pain, her symptoms have improved but not resolved with the IUD.  She states that if her symptoms were a 6/10 before, they are a 2/10 now. Bleeding is intermittent and unpredictable.  Nothing she can't manage at this point. She wants to know whether she should continue with the IUD or consider another option.  Past Medical History:  Diagnosis Date  . Depression   . Migraines   . Post partum depression     Past Surgical History:  Procedure Laterality Date  . CHOLECYSTECTOMY    . FOOT SURGERY Left   . KNEE SURGERY Left     Medications:   Medication Sig Start Date End Date Taking? Authorizing Provider  Prenatal Vit-Fe Fumarate-FA (PRENATAL VITAMIN PO) Take 1 tablet by mouth daily.   Yes [provider]    Allergies  Allergen Reactions  . Robitussin (Alcohol Free) [Guaifenesin] Itching    Took Benadryl, helped    Gynecologic History: No LMP  recorded. Patient is not currently having periods (Reason: IUD).  Obstetric History: G1P1001  Family History  Problem Relation Age of Onset  . Migraines Mother   . Cancer Maternal Grandmother   . Cancer Maternal Grandfather     Social History   Socioeconomic History  . Marital status: Single    Spouse name: Not on file  . Number of children: Not on file  . Years of education: Not on file  . Highest education level: Not on file  Social Needs  . Financial resource strain: Not on file  . Food insecurity - worry: Not on file  . Food insecurity - inability: Not on file  . Transportation needs - medical: Not on file  . Transportation needs - non-medical: Not on file  Occupational History  . Not on file  Tobacco Use  . Smoking status: Never Smoker  . Smokeless tobacco: Never Used  Substance and Sexual Activity  . Alcohol use: No    Comment: occasional, while not pregnant  . Drug use: No  . Sexual activity: Yes    Birth control/protection: Pill    Comment: 04/08/16  Other Topics Concern  . Not on file  Social History Narrative  . Not on file     Review of Systems: Review of Systems  Constitutional: Negative.   HENT: Negative.   Eyes: Negative.   Respiratory: Negative.   Cardiovascular: Negative.   Gastrointestinal: Negative.   Genitourinary: Negative.   Musculoskeletal: Negative.   Skin: Negative.  Neurological: Negative.   Psychiatric/Behavioral: Negative.     Physical Exam BP 122/70   Ht 5\' 3"  (1.6 m)   Wt 196 lb (88.9 kg)   BMI 34.72 kg/m   No LMP recorded. Patient is not currently having periods (Reason: IUD).  General: NAD HEENT: normocephalic, anicteric Thyroid: no enlargement Pulmonary: no increased work of breathing Neurologic: Grossly intact Psychiatric: mood appropriate, affect full  Assessment: 26 y.o. G1P1001 here for  1. Obesity (BMI 30.0-34.9)   2. Recurrent major depressive disorder, in partial remission (HCC)   3. Pelvic pain       Plan: Problem List Items Addressed This Visit    Major depression   Pelvic pain   Obesity (BMI 30.0-34.9) - Primary   Relevant Medications   phentermine (ADIPEX-P) 37.5 MG tablet     Obesity:  1) 1200 Calorie ADA Diet  2) Patient education given regarding appropriate lifestyle changes for weight loss including: regular physical activity, healthy coping strategies, caloric restriction and healthy eating patterns.  3) Patient will be started on weight loss medication. The risks and benefits and side effects of medication, such as Adipex (Phenteramine) ,  Tenuate (Diethylproprion), Belviq (lorcarsin), Contrave (buproprion/naltrexone), Qsymia (phentermine/topiramate), and Saxenda (liraglutide) is discussed. The pros and cons of suppressing appetite and boosting metabolism is discussed. Risks of tolerence and addiction is discussed for selected agents discussed. Use of medicine will ne short term, such as 3-4 months at a time followed by a period of time off of the medicine to avoid these risks and side effects for Adipex, Qsymia, and Tenuate discussed. Pt to call with any negative side effects and agrees to keep follow up appts.  4) Comorbidity Screening - hypothyroidism screening, diabetes, and hyperlipidemia screening offered  5) Encouraged weekly weight monitorig to track progress and sample 1 week food diary  6) Contraception - discussed that all weight loss drugs fall in to pregnancy category X, patient currently has reliable contraception in the form of IUD (Mirena)  7) Follow up in 4 weeks to assess response  Depression: in remission for now.  Continue to monitor.  Pelvic pain: Overall, it appears she is getting a benefit from the IUD as a medication. She is tolerating intercourse better and her pain is less severe and frequent. I encouraged her to give the device more time as she does not want the Nexplanon and there are not too many other hormonal options available to her at this  time given her history of migraine.   25 minutes spent in face to face discussion with > 50% spent in counseling, management, and coordination of care of her obesity, depression symptoms, and pelvic pain  Thomasene MohairStephen Delrick Dehart, MD 12/08/2017 4:20 PM

## 2017-12-09 ENCOUNTER — Encounter: Payer: Self-pay | Admitting: Obstetrics and Gynecology

## 2018-01-02 ENCOUNTER — Other Ambulatory Visit: Payer: Self-pay | Admitting: Obstetrics and Gynecology

## 2018-01-02 ENCOUNTER — Encounter: Payer: Self-pay | Admitting: Obstetrics and Gynecology

## 2018-01-02 DIAGNOSIS — E669 Obesity, unspecified: Secondary | ICD-10-CM

## 2018-01-06 ENCOUNTER — Other Ambulatory Visit: Payer: Self-pay | Admitting: Obstetrics and Gynecology

## 2018-01-06 ENCOUNTER — Telehealth: Payer: Self-pay

## 2018-01-06 DIAGNOSIS — E669 Obesity, unspecified: Secondary | ICD-10-CM

## 2018-01-06 DIAGNOSIS — Z6833 Body mass index (BMI) 33.0-33.9, adult: Secondary | ICD-10-CM

## 2018-01-06 MED ORDER — PHENTERMINE HCL 37.5 MG PO TABS: 37.5000 mg | ORAL_TABLET | Freq: Every day | ORAL | 0 refills | Status: DC

## 2018-01-06 NOTE — Progress Notes (Signed)
Patient taking phentermine well with no side effects. Weight change over past month is 196 lbs to 178 lbs. Refill on phentermine ordered.

## 2018-01-06 NOTE — Telephone Encounter (Signed)
Pt requesting refill on weight loss medication. Her current weight is 178lb and was 196 on 12/13. fwding to [facility].

## 2018-01-31 ENCOUNTER — Ambulatory Visit: Payer: Self-pay | Admitting: Family Medicine

## 2018-01-31 VITALS — BP 117/74 | HR 84 | Temp 98.8°F | Resp 16

## 2018-01-31 DIAGNOSIS — J09X2 Influenza due to identified novel influenza A virus with other respiratory manifestations: Secondary | ICD-10-CM

## 2018-01-31 DIAGNOSIS — R6889 Other general symptoms and signs: Secondary | ICD-10-CM

## 2018-01-31 MED ORDER — OSELTAMIVIR PHOSPHATE 75 MG PO CAPS: 75.0000 mg | ORAL_CAPSULE | Freq: Two times a day (BID) | ORAL | 0 refills | Status: DC

## 2018-01-31 NOTE — Patient Instructions (Addendum)
Even though your flu test is negative` you sure look enough like someone with the flu that I am going to go ahead and give a prescription for Tamiflu 1 twice daily for 5 days.  Drink plenty of fluids and get enough rest  Take Tylenol or ibuprofen as needed for fever or aching  Take over-the-counter Robitussin-DM or Mucinex DM or Delsym as needed for cough.  If you he gets more congested you can take Claritin-D or Allegra-D as needed for the head congestion.  Make sure your fever is gone for 24 hours and you are feeling better before trying to return to work   Influenza, Adult Influenza, more commonly known as "the flu," is a viral infection that primarily affects the respiratory tract. The respiratory tract includes organs that help you breathe, such as the lungs, nose, and throat. The flu causes many common cold symptoms, as well as a high fever and body aches. The flu spreads easily from person to person (is contagious). Getting a flu shot (influenza vaccination) every year is the best way to prevent influenza. What are the causes? Influenza is caused by a virus. You can catch the virus by:  Breathing in droplets from an infected person's cough or sneeze.  Touching something that was recently contaminated with the virus and then touching your mouth, nose, or eyes.  What increases the risk? The following factors may make you more likely to get the flu:  Not cleaning your hands frequently with soap and water or alcohol-based hand sanitizer.  Having close contact with many people during cold and flu season.  Touching your mouth, eyes, or nose without washing or sanitizing your hands first.  Not drinking enough fluids or not eating a healthy diet.  Not getting enough sleep or exercise.  Being under a high amount of stress.  Not getting a yearly (annual) flu shot.  You may be at a higher risk of complications from the flu, such as a severe lung infection (pneumonia), if  you:  Are over the age of 27.  Are pregnant.  Have a weakened disease-fighting system (immune system). You may have a weakened immune system if you: ? Have HIV or AIDS. ? Are undergoing chemotherapy. ? Aretaking medicines that reduce the activity of (suppress) the immune system.  Have a long-term (chronic) illness, such as heart disease, kidney disease, diabetes, or lung disease.  Have a liver disorder.  Are obese.  Have anemia.  What are the signs or symptoms? Symptoms of this condition typically last 4-10 days and may include:  Fever.  Chills.  Headache, body aches, or muscle aches.  Sore throat.  Cough.  Runny or congested nose.  Chest discomfort and cough.  Poor appetite.  Weakness or tiredness (fatigue).  Dizziness.  Nausea or vomiting.  How is this diagnosed? This condition may be diagnosed based on your medical history and a physical exam. Your health care provider may do a nose or throat swab test to confirm the diagnosis. How is this treated? If influenza is detected early, you can be treated with antiviral medicine that can reduce the length of your illness and the severity of your symptoms. This medicine may be given by mouth (orally) or through an IV tube that is inserted in one of your veins. The goal of treatment is to relieve symptoms by taking care of yourself at home. This may include taking over-the-counter medicines, drinking plenty of fluids, and adding humidity to the air in your home. In some  cases, influenza goes away on its own. Severe influenza or complications from influenza may be treated in a hospital. Follow these instructions at home:  Take over-the-counter and prescription medicines only as told by your health care provider.  Use a cool mist humidifier to add humidity to the air in your home. This can make breathing easier.  Rest as needed.  Drink enough fluid to keep your urine clear or pale yellow.  Cover your mouth and  nose when you cough or sneeze.  Wash your hands with soap and water often, especially after you cough or sneeze. If soap and water are not available, use hand sanitizer.  Stay home from work or school as told by your health care provider. Unless you are visiting your health care provider, try to avoid leaving home until your fever has been gone for 24 hours without the use of medicine.  Keep all follow-up visits as told by your health care provider. This is important. How is this prevented?  Getting an annual flu shot is the best way to avoid getting the flu. You may get the flu shot in late summer, fall, or winter. Ask your health care provider when you should get your flu shot.  Wash your hands often or use hand sanitizer often.  Avoid contact with people who are sick during cold and flu season.  Eat a healthy diet, drink plenty of fluids, get enough sleep, and exercise regularly. Contact a health care provider if:  You develop new symptoms.  You have: ? Chest pain. ? Diarrhea. ? A fever.  Your cough gets worse.  You produce more mucus.  You feel nauseous or you vomit. Get help right away if:  You develop shortness of breath or difficulty breathing.  Your skin or nails turn a bluish color.  You have severe pain or stiffness in your neck.  You develop a sudden headache or sudden pain in your face or ear.  You cannot stop vomiting. This information is not intended to replace advice given to you by your health care provider. Make sure you discuss any questions you have with your health care provider. Document Released: 12/10/2000 Document Revised: 05/20/2016 Document Reviewed: 10/07/2015 Elsevier Interactive Patient Education  2017 [employer].

## 2018-01-31 NOTE — Progress Notes (Addendum)
Patient ID: Shannon [name], female    DOB: [birth year]  Age: 27 y.o. MRN: [id number]  Chief Complaint  Patient presents with  . Fever  . Sore Throat  . Nausea    Subjective:  Patient has been sick since yesterday.  She has had a sore throat, nausea, some cough, temperature of 101 yesterday at home.  She feels lousy, aches all over, has not felt like this.  She did not get a flu shot.  No one else at home has been sick with same symptoms.  She has a husband and child.  Objective:  Looks ill.  TMs are normal.  Throat not erythematous.  Neck supple without any nodes.  Chest is clear to auscultation.  Heart regular without murmurs.  She is coughing some.  Flu swab was done and is negative.   Assessment & Plan:   Assessment: 1. Flu-like symptoms   2. Influenza due to identified novel influenza A virus with other respiratory manifestations       Plan: See instructions.  She looks enough like the flu that I am going ahead and treating her.  I am not confident the tests are accurate.  Patient is not lactating. No orders of the defined types were placed in this encounter.   Meds ordered this encounter  Medications  . oseltamivir (TAMIFLU) 75 MG capsule    Sig: Take 1 capsule (75 mg total) by mouth 2 (two) times daily.    Dispense:  10 capsule    Refill:  0

## 2018-01-31 NOTE — Addendum Note (Signed)
Addended by: HOPPER, DAVID H on: 01/31/2018 03:46 PM   Modules accepted: Orders

## 2018-02-08 ENCOUNTER — Encounter: Payer: Self-pay | Admitting: Obstetrics and Gynecology

## 2018-02-18 ENCOUNTER — Other Ambulatory Visit: Payer: Self-pay | Admitting: Obstetrics and Gynecology

## 2018-02-18 DIAGNOSIS — E669 Obesity, unspecified: Secondary | ICD-10-CM

## 2018-02-18 DIAGNOSIS — Z6833 Body mass index (BMI) 33.0-33.9, adult: Secondary | ICD-10-CM

## 2018-02-18 MED ORDER — PHENTERMINE HCL 37.5 MG PO TABS: 37.5000 mg | ORAL_TABLET | Freq: Every day | ORAL | 0 refills | Status: DC

## 2018-02-18 NOTE — Progress Notes (Signed)
Refill on phentermine. Patient with weight of 169 pounds. Will refill phentermine one more time.  Then will need a medication holiday.   Shannon MohairStephen Hamp Moreland, MD 02/18/2018 12:46 PM

## 2018-02-27 ENCOUNTER — Encounter: Payer: Self-pay | Admitting: Emergency Medicine

## 2018-02-27 ENCOUNTER — Emergency Department: Payer: Worker's Compensation

## 2018-02-27 DIAGNOSIS — Z79899 Other long term (current) drug therapy: Secondary | ICD-10-CM | POA: Insufficient documentation

## 2018-02-27 DIAGNOSIS — Y9389 Activity, other specified: Secondary | ICD-10-CM | POA: Insufficient documentation

## 2018-02-27 DIAGNOSIS — X58XXXA Exposure to other specified factors, initial encounter: Secondary | ICD-10-CM | POA: Insufficient documentation

## 2018-02-27 DIAGNOSIS — M25532 Pain in left wrist: Secondary | ICD-10-CM | POA: Insufficient documentation

## 2018-02-27 DIAGNOSIS — Y929 Unspecified place or not applicable: Secondary | ICD-10-CM | POA: Diagnosis not present

## 2018-02-27 DIAGNOSIS — Y990 Civilian activity done for income or pay: Secondary | ICD-10-CM | POA: Insufficient documentation
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
## 2018-02-28 ENCOUNTER — Emergency Department
Admission: EM | Admit: 2018-02-28 | Discharge: 2018-02-28 | Disposition: A | Discharge status: Home / Self Care | Payer: Worker's Compensation | Attending: Student in an Organized Health Care Education/Training Program | Admitting: Student in an Organized Health Care Education/Training Program

## 2018-02-28 DIAGNOSIS — M25532 Pain in left wrist: Secondary | ICD-10-CM

## 2018-02-28 NOTE — ED Notes (Signed)
Esign not working pt verbalized discharge instructions and has no questions at this time 

## 2018-02-28 NOTE — ED Notes (Signed)
Per Supervisor Lt/ Careers adviserMinnich officer, no need for drug screen or BAT
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
## 2018-02-28 NOTE — Discharge Instructions (Signed)
Recommend he follow-up with orthopedic surgeon Dr. Stephenie AcresSoria for repeat evaluation of your left wrist injury.  Please keep splint in place except while showering.  Keep arm elevated he can ice as well as take over-the-counter anti-inflammatories for pain.  Return to ER if you have any additional questions or concerns.

## 2018-03-07 DIAGNOSIS — S60222A Contusion of left hand, initial encounter: Secondary | ICD-10-CM | POA: Insufficient documentation

## 2018-03-07 DIAGNOSIS — M79642 Pain in left hand: Secondary | ICD-10-CM | POA: Insufficient documentation

## 2018-05-10 ENCOUNTER — Ambulatory Visit: Payer: Self-pay | Admitting: Family Medicine

## 2018-05-10 VITALS — BP 102/69 | HR 81 | Temp 98.3°F | Resp 18 | Ht 63.0 in | Wt 180.0 lb

## 2018-05-10 DIAGNOSIS — Z0189 Encounter for other specified special examinations: Principal | ICD-10-CM

## 2018-05-10 DIAGNOSIS — Z008 Encounter for other general examination: Secondary | ICD-10-CM

## 2018-05-10 LAB — GLUCOSE, POCT (MANUAL RESULT ENTRY): POC GLUCOSE: 112 mg/dL — AB (ref 70–99)

## 2018-05-10 NOTE — Progress Notes (Signed)
Subjective: Annual biometrics screening  Patient presents for her annual biometric screening. Patient denies any medical history.  Patient denies a personal history of prediabetes or diabetes.  Patient reports multiple family members on her mom's side have prediabetes and type II diabetes.  Patient reports eating a healthy, well-rounded diet and getting regular exercise. PCP: None currently. Patient works for the [employer]. Patient denies any other issues or concerns.   Review of Systems Unremarkable  Objective  Physical Exam General: Awake, alert and oriented. No acute distress. Well developed, hydrated and nourished. Appears stated age.  HEENT: Supple neck without adenopathy. Sclera is non-icteric. The ear canal is clear without discharge. The tympanic membrane is normal in appearance with normal landmarks and cone of light. Nasal mucosa is pink and moist. Oral mucosa is pink and moist. The pharynx is normal in appearance without tonsillar swelling or exudates.  Skin: Skin in warm, dry and intact without rashes or lesions. Appropriate color for ethnicity. Cardiac: Heart rate and rhythm are normal. No murmurs, gallops, or rubs are auscultated.  Respiratory: The chest wall is symmetric and without deformity. No signs of respiratory distress. Lung sounds are clear in all lobes bilaterally without rales, ronchi, or wheezes.  Neurological: The patient is awake, alert and oriented to person, place, and time with normal speech.  Memory is normal and thought processes intact. No gait abnormalities are appreciated.  Psychiatric: Appropriate mood and affect.   Assessment Annual biometrics screening  Plan  Lipid panel pending. Encouraged routine visits with primary care provider.  Provided patient with resources and encouraged her to establish care with a new primary care provider this week. Fasting blood sugar is 112 today.  Discussed normal values and impaired fasting glucose.  Discussed  lifestyle modifications.  Advised patient to follow-up with her primary care provider within the next 1 to 2 months regarding this for further evaluation. Encouraged patient to get regular exercise and eat a healthy, well-rounded diet.

## 2018-05-11 LAB — LIPID PANEL
CHOL/HDL RATIO: 2.4 ratio (ref 0.0–4.4)
CHOLESTEROL TOTAL: 157 mg/dL (ref 100–199)
HDL: 65 mg/dL (ref >39)
LDL Calculated: 80 mg/dL (ref 0–99)
Triglycerides: 61 mg/dL (ref 0–149)
VLDL Cholesterol Cal: 12 mg/dL (ref 5–40)

## 2018-06-12 ENCOUNTER — Ambulatory Visit (INDEPENDENT_AMBULATORY_CARE_PROVIDER_SITE_OTHER): Payer: Managed Care, Other (non HMO) | Admitting: Obstetrics and Gynecology

## 2018-06-12 ENCOUNTER — Encounter: Payer: Self-pay | Admitting: Obstetrics and Gynecology

## 2018-06-12 VITALS — BP 112/68 | HR 103 | Ht 63.0 in | Wt 180.0 lb

## 2018-06-12 DIAGNOSIS — Z30432 Encounter for removal of intrauterine contraceptive device: Secondary | ICD-10-CM

## 2018-06-12 NOTE — Progress Notes (Signed)
   GYNECOLOGY OFFICE PROCEDURE NOTE  Shannon RailKarla A [name] is a 27 y.o. G1P1001 here for Mirena IUD removal placed 1 year ago. She desires removal secondary to cramping, abnormal uterine bleeding, and desire to conceive later this year.  IUD Removal  Patient identified, informed consent performed, consent signed.  Patient was in the dorsal lithotomy position, normal external genitalia was noted.  A speculum was placed in the patient's vagina, normal discharge was noted, no lesions. The cervix was visualized, no lesions, no abnormal discharge.  The strings of the IUD were grasped and pulled using ring forceps. The IUD was removed in its entirety. Patient tolerated the procedure well.    Patient plans for pregnancy soon and she was told to avoid teratogens, take PNV and folic acid.  Routine preventative health maintenance measures emphasized.   Vena AustriaAndreas Latresha Yahr, MD, Merlinda FrederickFACOG [facility], [facility]

## 2018-08-07 ENCOUNTER — Encounter: Payer: Managed Care, Other (non HMO) | Admitting: Obstetrics and Gynecology

## 2018-08-08 ENCOUNTER — Ambulatory Visit (INDEPENDENT_AMBULATORY_CARE_PROVIDER_SITE_OTHER): Payer: Managed Care, Other (non HMO)

## 2018-08-08 ENCOUNTER — Other Ambulatory Visit: Payer: Managed Care, Other (non HMO)

## 2018-08-08 ENCOUNTER — Other Ambulatory Visit: Payer: Self-pay | Admitting: Obstetrics and Gynecology

## 2018-08-08 ENCOUNTER — Encounter: Payer: Self-pay | Admitting: Obstetrics and Gynecology

## 2018-08-08 ENCOUNTER — Ambulatory Visit (INDEPENDENT_AMBULATORY_CARE_PROVIDER_SITE_OTHER): Payer: Managed Care, Other (non HMO) | Admitting: Obstetrics and Gynecology

## 2018-08-08 VITALS — BP 114/76 | HR 76 | Ht 63.0 in | Wt 187.0 lb

## 2018-08-08 DIAGNOSIS — Z3200 Encounter for pregnancy test, result unknown: Secondary | ICD-10-CM | POA: Diagnosis not present

## 2018-08-08 DIAGNOSIS — Z3201 Encounter for pregnancy test, result positive: Secondary | ICD-10-CM

## 2018-08-08 DIAGNOSIS — N926 Irregular menstruation, unspecified: Secondary | ICD-10-CM

## 2018-08-08 DIAGNOSIS — O3411 Maternal care for benign tumor of corpus uteri, first trimester: Secondary | ICD-10-CM | POA: Diagnosis not present

## 2018-08-08 DIAGNOSIS — N8312 Corpus luteum cyst of left ovary: Secondary | ICD-10-CM

## 2018-08-08 DIAGNOSIS — Z3A01 Less than 8 weeks gestation of pregnancy: Secondary | ICD-10-CM | POA: Diagnosis not present

## 2018-08-08 LAB — POCT URINE PREGNANCY: PREG TEST UR: POSITIVE — AB

## 2018-08-08 NOTE — Progress Notes (Signed)
HPI:      Shannon [name] is a 27 y.o. G1P1001 who LMP was Patient's last menstrual period was 06/10/2018 (exact date).  Subjective:   She presents today for pregnancy confirmation.  She had her IUD removed in June and has not had a menstrual period since that time.  This was an elected pregnancy.  She is currently taking prenatal vitamins. Her last pregnancy was complicated by first trimester nausea and vomiting as well as early third trimester signs of preterm labor.  She states that she got steroid shots and something to stop her contractions.  She subsequently delivered her baby at term after spontaneous rupture of membranes.  This was a vaginal birth.     Hx: The following portions of the patient's history were reviewed and updated as appropriate:             She  has a past medical history of Depression, Migraines, and Post partum depression. She does not have any pertinent problems on file. She  has a past surgical history that includes Knee surgery (Left); Foot surgery (Left); and Cholecystectomy. Her family history includes Cancer in her maternal grandfather and maternal grandmother; Migraines in her mother. She  reports that she has never smoked. She has never used smokeless tobacco. She reports that she does not drink alcohol or use drugs. She has a current medication list which includes the following Facility-Administered Medications: levonorgestrel. She is allergic to robitussin (alcohol free) [guaifenesin].       Review of Systems:  Review of Systems  Constitutional: Denied constitutional symptoms, night sweats, recent illness, fatigue, fever, insomnia and weight loss.  Eyes: Denied eye symptoms, eye pain, photophobia, vision change and visual disturbance.  Ears/Nose/Throat/Neck: Denied ear, nose, throat or neck symptoms, hearing loss, nasal discharge, sinus congestion and sore throat.  Cardiovascular: Denied cardiovascular symptoms, arrhythmia, chest pain/pressure, edema,  exercise intolerance, orthopnea and palpitations.  Respiratory: Denied pulmonary symptoms, asthma, pleuritic pain, productive sputum, cough, dyspnea and wheezing.  Gastrointestinal: Denied, gastro-esophageal reflux, melena, nausea and vomiting.  Genitourinary: Denied genitourinary symptoms including symptomatic vaginal discharge, pelvic relaxation issues, and urinary complaints.  Musculoskeletal: Denied musculoskeletal symptoms, stiffness, swelling, muscle weakness and myalgia.  Dermatologic: Denied dermatology symptoms, rash and scar.  Neurologic: Denied neurology symptoms, dizziness, headache, neck pain and syncope.  Psychiatric: Denied psychiatric symptoms, anxiety and depression.  Endocrine: Denied endocrine symptoms including hot flashes and night sweats.   Meds:   No current outpatient medications on file prior to visit.   Current Facility-Administered Medications on File Prior to Visit  Medication Dose Route Frequency Provider Last Rate Last Dose  . levonorgestrel (MIRENA) 20 MCG/24HR IUD   Intrauterine Once Conard NovakJackson, Stephen D, MD        Objective:     Vitals:   08/08/18 0928  BP: 114/76  Pulse: 76              Physical examination   Pelvic:   Vulva: Normal appearance.  No lesions.  Vagina: No lesions or abnormalities noted.  Support: Normal pelvic support.  Urethra No masses tenderness or scarring.  Meatus Normal size without lesions or prolapse.  Cervix: Normal appearance.  No lesions.  Anus: Normal exam.  No lesions.  Perineum: Normal exam.  No lesions.        Bimanual   Uterus: 4-6 weeks.  Non-tender.  Mobile.  AV.  Adnexae: No masses.  Non-tender to palpation.  Cul-de-sac: Negative for abnormality.     Assessment:  G1P1001 Patient Active Problem List   Diagnosis Date Noted  . Major depression 07/27/2017  . Pelvic pain 07/27/2017  . Obesity (BMI 30.0-34.9) 07/27/2017  . BMI 33.0-33.9,adult 05/20/2016  . Migraine without aura and without status  migrainosus, not intractable 09/26/2014     1. Possible pregnancy     Positive pregnancy test.  Dating for menstrual period unknown after IUD removal.   Plan:            Prenatal Plan 1.  The patient was given prenatal literature. 2.  She was continued on prenatal vitamins. 3.  A prenatal lab panel was ordered or drawn. 4.  An ultrasound was ordered to better determine an EDC. 5.  A nurse visit was scheduled. 6.  Genetic testing and testing for other inheritable conditions discussed in detail. She will decide in the future whether to have these labs performed. 7.  A general overview of pregnancy testing, visit schedule, ultrasound schedule, and prenatal care was discussed.   Orders Orders Placed This Encounter  Procedures  . POCT urine pregnancy    No orders of the defined types were placed in this encounter.     F/U  Return in about 5 weeks (around 09/12/2018). I spent 32 minutes involved in the care of this patient of which greater than 50% was spent discussing current pregnancy, previous pregnancy, previous risks of pregnancy including preterm labor and first trimester nausea vomiting, expectations for this pregnancy, genetic testing, AFP testing discussion of work and what would be appropriate during the pregnancy-all questions answered.  Elonda Huskyavid J. Chyanne Kohut, M.D. 08/08/2018 10:21 AM

## 2018-08-08 NOTE — Progress Notes (Signed)
Pt presents today to confirm planned pregnancy. Has no concerns at this time.

## 2018-08-22 ENCOUNTER — Ambulatory Visit (INDEPENDENT_AMBULATORY_CARE_PROVIDER_SITE_OTHER): Payer: Managed Care, Other (non HMO) | Admitting: Obstetrics and Gynecology

## 2018-08-22 VITALS — BP 108/70 | HR 70 | Ht 63.0 in | Wt 189.8 lb

## 2018-08-22 DIAGNOSIS — Z3A08 8 weeks gestation of pregnancy: Secondary | ICD-10-CM

## 2018-08-22 NOTE — Patient Instructions (Signed)
First Trimester of Pregnancy The first trimester of pregnancy is from week 1 until the end of week 13 (months 1 through 3). During this time, your baby will begin to develop inside you. At 6-8 weeks, the eyes and face are formed, and the heartbeat can be seen on ultrasound. At the end of 12 weeks, all the baby's organs are formed. Prenatal care is all the medical care you receive before the birth of your baby. Make sure you get good prenatal care and follow all of your doctor's instructions. Follow these instructions at home: Medicines  Take over-the-counter and prescription medicines only as told by your doctor. Some medicines are safe and some medicines are not safe during pregnancy.  Take a prenatal vitamin that contains at least 600 micrograms (mcg) of folic acid.  If you have trouble pooping (constipation), take medicine that will make your stool soft (stool softener) if your doctor approves. Eating and drinking  Eat regular, healthy meals.  Your doctor will tell you the amount of weight gain that is right for you.  Avoid raw meat and uncooked cheese.  If you feel sick to your stomach (nauseous) or throw up (vomit): ? Eat 4 or 5 small meals a day instead of 3 large meals. ? Try eating a few soda crackers. ? Drink liquids between meals instead of during meals.  To prevent constipation: ? Eat foods that are high in fiber, like fresh fruits and vegetables, whole grains, and beans. ? Drink enough fluids to keep your pee (urine) clear or pale yellow. Activity  Exercise only as told by your doctor. Stop exercising if you have cramps or pain in your lower belly (abdomen) or low back.  Do not exercise if it is too hot, too humid, or if you are in a place of great height (high altitude).  Try to avoid standing for long periods of time. Move your legs often if you must stand in one place for a long time.  Avoid heavy lifting.  Wear low-heeled shoes. Sit and stand up straight.  You  can have sex unless your doctor tells you not to. Relieving pain and discomfort  Wear a good support bra if your breasts are sore.  Take warm water baths (sitz baths) to soothe pain or discomfort caused by hemorrhoids. Use hemorrhoid cream if your doctor says it is okay.  Rest with your legs raised if you have leg cramps or low back pain.  If you have puffy, bulging veins (varicose veins) in your legs: ? Wear support hose or compression stockings as told by your doctor. ? Raise (elevate) your feet for 15 minutes, 3-4 times a day. ? Limit salt in your food. Prenatal care  Schedule your prenatal visits by the twelfth week of pregnancy.  Write down your questions. Take them to your prenatal visits.  Keep all your prenatal visits as told by your doctor. This is important. Safety  Wear your seat belt at all times when driving.  Make a list of emergency phone numbers. The list should include numbers for family, friends, the hospital, and police and fire departments. General instructions  Ask your doctor for a referral to a local prenatal class. Begin classes no later than at the start of month 6 of your pregnancy.  Ask for help if you need counseling or if you need help with nutrition. Your doctor can give you advice or tell you where to go for help.  Do not use hot tubs, steam rooms, or   saunas.  Do not douche or use tampons or scented sanitary pads.  Do not cross your legs for long periods of time.  Avoid all herbs and alcohol. Avoid drugs that are not approved by your doctor.  Do not use any tobacco products, including cigarettes, chewing tobacco, and electronic cigarettes. If you need help quitting, ask your doctor. You may get counseling or other support to help you quit.  Avoid cat litter boxes and soil used by cats. These carry germs that can cause birth defects in the baby and can cause a loss of your baby (miscarriage) or stillbirth.  Visit your dentist. At home, brush  your teeth with a soft toothbrush. Be gentle when you floss. Contact a doctor if:  You are dizzy.  You have mild cramps or pressure in your lower belly.  You have a nagging pain in your belly area.  You continue to feel sick to your stomach, you throw up, or you have watery poop (diarrhea).  You have a bad smelling fluid coming from your vagina.  You have pain when you pee (urinate).  You have increased puffiness (swelling) in your face, hands, legs, or ankles. Get help right away if:  You have a fever.  You are leaking fluid from your vagina.  You have spotting or bleeding from your vagina.  You have very bad belly cramping or pain.  You gain or lose weight rapidly.  You throw up blood. It may look like coffee grounds.  You are around people who have German measles, fifth disease, or chickenpox.  You have a very bad headache.  You have shortness of breath.  You have any kind of trauma, such as from a fall or a car accident. Summary  The first trimester of pregnancy is from week 1 until the end of week 13 (months 1 through 3).  To take care of yourself and your unborn baby, you will need to eat healthy meals, take medicines only if your doctor tells you to do so, and do activities that are safe for you and your baby.  Keep all follow-up visits as told by your doctor. This is important as your doctor will have to ensure that your baby is healthy and growing well. This information is not intended to replace advice given to you by your health care provider. Make sure you discuss any questions you have with your health care provider. Document Released: 05/31/2008 Document Revised: 12/21/2016 Document Reviewed: 12/21/2016 Elsevier Interactive Patient Education  2017 [employer]  

## 2018-08-22 NOTE — Progress Notes (Signed)
Shannon [name] presents for NOB nurse interview visit. Pregnancy confirmation done here at [facility].  G-2 .  P-1    . Pregnancy education material explained and given. __No_ cats in the home. NOB labs ordered. (TSH/HbgA1c due to Increased BMI), HIV labs and Drug screen were explained optional and she did not decline. Drug screen ordered. PNV encouraged. Genetic screening options discussed. Genetic testing:/Unsure. We discussed the FMLA form to sign and went over the financial policy she signed paperwork.  She already has appt set up with Dr. Logan BoresEvans on 09/12/18.  All questions answered.

## 2018-08-23 LAB — URINALYSIS, ROUTINE W REFLEX MICROSCOPIC
BILIRUBIN UA: NEGATIVE
Glucose, UA: NEGATIVE
Ketones, UA: NEGATIVE
Nitrite, UA: NEGATIVE
PH UA: 7 (ref 5.0–7.5)
PROTEIN UA: NEGATIVE
RBC, UA: NEGATIVE
Specific Gravity, UA: 1.011 (ref 1.005–1.030)
Urobilinogen, Ur: 0.2 mg/dL (ref 0.2–1.0)

## 2018-08-23 LAB — MONITOR DRUG PROFILE 14(MW)
Amphetamine Scrn, Ur: NEGATIVE ng/mL
BARBITURATE SCREEN URINE: NEGATIVE ng/mL
BENZODIAZEPINE SCREEN, URINE: NEGATIVE ng/mL
BUPRENORPHINE, URINE: NEGATIVE ng/mL
CANNABINOIDS UR QL SCN: NEGATIVE ng/mL
CREATININE(CRT), U: 48.8 mg/dL (ref 20.0–300.0)
Cocaine (Metab) Scrn, Ur: NEGATIVE ng/mL
Fentanyl, Urine: NEGATIVE pg/mL
METHADONE SCREEN, URINE: NEGATIVE ng/mL
Meperidine Screen, Urine: NEGATIVE ng/mL
OPIATE SCREEN URINE: NEGATIVE ng/mL
OXYCODONE+OXYMORPHONE UR QL SCN: NEGATIVE ng/mL
PHENCYCLIDINE QUANTITATIVE URINE: NEGATIVE ng/mL
Ph of Urine: 7.2 (ref 4.5–8.9)
Propoxyphene Scrn, Ur: NEGATIVE ng/mL
SPECIFIC GRAVITY: 1.02
Tramadol Screen, Urine: NEGATIVE ng/mL

## 2018-08-23 LAB — HIV ANTIBODY (ROUTINE TESTING W REFLEX): HIV SCREEN 4TH GENERATION: NONREACTIVE

## 2018-08-23 LAB — ABO AND RH: RH TYPE: POSITIVE

## 2018-08-23 LAB — MICROSCOPIC EXAMINATION: CASTS: NONE SEEN /LPF

## 2018-08-23 LAB — RUBELLA SCREEN: Rubella Antibodies, IGG: 8.07 index (ref >0.99)

## 2018-08-23 LAB — HEPATITIS B SURFACE ANTIGEN: Hepatitis B Surface Ag: NEGATIVE

## 2018-08-23 LAB — HEMOGLOBIN A1C
ESTIMATED AVERAGE GLUCOSE: 111 mg/dL
Hgb A1c MFr Bld: 5.5 % (ref 4.8–5.6)

## 2018-08-23 LAB — TSH: TSH: 1.5 u[IU]/mL (ref 0.450–4.500)

## 2018-08-23 LAB — VARICELLA ZOSTER ANTIBODY, IGG: Varicella zoster IgG: 812 index (ref >165)

## 2018-08-23 LAB — RPR: RPR: NONREACTIVE

## 2018-08-23 LAB — ANTIBODY SCREEN: Antibody Screen: NEGATIVE

## 2018-08-24 LAB — URINE CULTURE

## 2018-08-24 LAB — GC/CHLAMYDIA PROBE AMP
Chlamydia trachomatis, NAA: NEGATIVE
Neisseria gonorrhoeae by PCR: NEGATIVE

## 2018-08-29 ENCOUNTER — Other Ambulatory Visit: Payer: Self-pay | Admitting: Certified Nurse Midwife

## 2018-08-29 ENCOUNTER — Ambulatory Visit (INDEPENDENT_AMBULATORY_CARE_PROVIDER_SITE_OTHER): Payer: Managed Care, Other (non HMO) | Admitting: Certified Nurse Midwife

## 2018-08-29 ENCOUNTER — Other Ambulatory Visit (INDEPENDENT_AMBULATORY_CARE_PROVIDER_SITE_OTHER): Payer: Managed Care, Other (non HMO)

## 2018-08-29 ENCOUNTER — Telehealth: Payer: Self-pay | Admitting: Certified Nurse Midwife

## 2018-08-29 ENCOUNTER — Other Ambulatory Visit: Payer: Managed Care, Other (non HMO)

## 2018-08-29 ENCOUNTER — Telehealth: Payer: Self-pay

## 2018-08-29 VITALS — BP 123/87 | HR 79 | Wt 191.3 lb

## 2018-08-29 DIAGNOSIS — O200 Threatened abortion: Secondary | ICD-10-CM

## 2018-08-29 DIAGNOSIS — Z3A01 Less than 8 weeks gestation of pregnancy: Secondary | ICD-10-CM

## 2018-08-29 DIAGNOSIS — O209 Hemorrhage in early pregnancy, unspecified: Secondary | ICD-10-CM

## 2018-08-29 LAB — POCT URINALYSIS DIPSTICK OB
Bilirubin, UA: NEGATIVE
Blood, UA: NEGATIVE
Glucose, UA: NEGATIVE
KETONES UA: NEGATIVE
Leukocytes, UA: NEGATIVE
NITRITE UA: NEGATIVE
PROTEIN: NEGATIVE
Spec Grav, UA: 1.02 (ref 1.010–1.025)
Urobilinogen, UA: 0.2 E.U./dL
pH, UA: 5 (ref 5.0–8.0)

## 2018-08-29 MED ORDER — MISOPROSTOL 200 MCG PO TABS: 800.0000 ug | ORAL_TABLET | Freq: Once | ORAL | 1 refills | Status: DC

## 2018-08-29 MED ORDER — OXYCODONE-ACETAMINOPHEN 2.5-325 MG PO TABS: 1.0000 | ORAL_TABLET | ORAL | 0 refills | Status: DC | PRN

## 2018-08-29 NOTE — Patient Instructions (Signed)

## 2018-08-29 NOTE — Progress Notes (Signed)
Subjective:    Shannon [name] is a 27 y.o. female. Shannon [name] reports bleeding since 8/30. She is in mild distress. Ectopic risks: none.  . Pregnancy imaging: transvaginal ultrasound done on 08/08/90. Result WNL. Blood type: O positive. Other lab results: none.  The following portions of the patient's history were reviewed and updated as appropriate: allergies, current medications, past family history, past medical history, past social history, past surgical history and problem list.  Review of Systems Pertinent items are noted in HPI.   Objective:     BP 123/87   Pulse 79   Wt 86.8 kg   LMP 06/26/2018   BMI 33.89 kg/m  General:   alert, cooperative, appears stated age and mild distress  Heart: regular rate and rhythm     Abdomen: soft, non-tender, without masses or organomegaly  Pelvic: Vulva and vagina appear normal. Bimanual exam reveals normal uterus and adnexa.                 Cervix: no cervical motion tenderness and no active bleeding noted               Uterus: non-tender, normal shape and consistency              Adnexa: normal adnexa   Imaging Limited office ultrasound:     ULTRASOUND REPORT  Location: [facility] Date of Service:  08/29/2018  Indications: Bleeding & cramping in early pregnancy; Viability Findings:  Singleton NON-VIABLE intrauterine pregnancy is visualized with a CRL consistent with 6 1/[redacted] weeks gestation, giving an (U/S) EDD of 04/23/19. The (U/S) EDD IS NOT consistent with the clinically established (LMP) EDD of 04/02/19. No growth has happened since previous dating/viability scan on 08/08/18.  FHR: Fetal heart tones are not identified on m-mode, power doppler, or color doppler. CRL measurement: 4.2 mm Yolk sac is not identified on today's exam. The gestational sac measures approximately 6 3/[redacted] weeks gestation.  Right Ovary measures 2.3 x 1.8 x 1.2 cm. It is normal in appearance. Left Ovary measures 2.3 x 1.6 x 1.2 cm. It is normal  appearance. There is no obvious evidence of a corpus luteal cyst. Survey of the adnexa demonstrates no adnexal masses. There is no free peritoneal fluid in the cul de sac.  Impression: 1. 6 1/7 week NON-VIABLE Singleton Intrauterine pregnancy by U/S. 2. (U/S) EDD IS NOT consistent with Clinically established (LMP) EDD of 04/02/19. 3. Fetal heart tones are not identified on today's exam. 4. No growth has happened since previous dating/viability scan on 08/08/18. 5. Notified Derrell Milanes of findings.  Recommendations: 1.Clinical correlation with the patient's History and Physical Exam.  Kari Baars, RDMS  Assessment:      Bleeding in early pregnancy   Plan:  Reviewed Options for treatment. Pt request to use cytotec. Reviewed use , and red flag symptoms . Pt is to return after passing tissue for beta hcg level.   Doreene Burke, CNM   FACTS YOU SHOULD KNOW  About Early Pregnancy Loss  WHAT IS AN EARLY PREGNANCY LOSS? Once the egg is fertilized with the sperm and begins to develop, it attaches to the lining of the uterus. This early pregnancy tissue may not develop into an embryo (the beginning stage of a baby). Sometimes an embryo does develop but does not continue to grow. These problems can be seen on ultrasound.   MANAGEMNT OF EARLY PREGNANCY LOSS: About 4 out of 100 (0.25%) women will have a pregnancy loss in her lifetime.  One  in five pregnancies is found to be an early pregnancy loss.  There are 3 ways to care for an early pregnancy loss:   (1) Surgery, (2) Medicine, (3) Waiting for you to pass the pregnancy on your own. The decision as to how to proceed after being diagnosed with and early pregnancy loss is an individual one.  The decision can be made only after appropriate counseling.  You need to weigh the pros and cons of the 3 choices. Then you can make the choice that works for you.  SURGERY (D&E) . Procedure over in 1 day . Requires being put to sleep . Bleeding may be  light . Possible problems during surgery, including injury to womb(uterus) . Care provider has more control Medicine (CYTOTEC) . The complete procedure may take days to weeks . No Surgery . Bleeding may be heavy at times . There may be drug side effects . Patient has more control Waiting . You may choose to wait, in which case your own body may complete the passing of the abnormal early pregnancy on its own in about 2-4 weeks . Your bleeding may be heavy at times . There is a small possibility that you may need surgery if the bleeding is too much or not all of the pregnancy has passed.  CYTOTEC MANAGEMENT Prostaglandins (cytotec) are the most widely used drug for this purpose. They cause the uterus to cramp and contract. You will place the medicine yourself inside your vagina in the privacy of your home. Empting of the uterus should occur within 3 days but the process may continue for several weeks. The bleeding may seem heavy at times.  INSTRUCTIONS: Take all 4 tablets of cytotec ( total) at one time. This will cause a lot of cramping, you may have bleeding, and pass tissue, then the cramping and bleeding should get better. If you do not pass the tissue, then you can take 4 more tablets of cytotec ( total) 48 hours after your first dose.  You will come back to have your blood drawn to make sure the pregnancy hormones are dropping in 1 week. Please call us if you have any questions.   POSSIBLE SIDE EFFECTS FROM CYTOTEC . Nausea  Vomiting . Diarrhea Fever . Chills  Hot Flashes Side effects  from the process of the early pregnancy loss include: . Cramping  Bleeding . Headaches  Dizziness RISKS: This is a low risk procedure. Less than 1 in 100 women has a complication. An incomplete passage of the early pregnancy may occur. Also, hemorrhage (heavy bleeding) could happen.  Rarely the pregnancy will not be passed completely. Excessively heavy bleeding may occur.  Your doctor may  need to perform surgery to empty the uterus (D&E). Afterwards: Everybody will feel differently after the early pregnancy loss completion. You may have soreness or cramps for a day or two. You may have soreness or cramps for day or two.  You may have light bleeding for up to 2 weeks. You may be as active as you feel like being. If you have any of the following problems you may call [facility] at [phone] or [facility]missions [facility] at [phone] if it is after hours. . If you have pain that does not get better with pain medication . Bleeding that soaks through 2 thick full-sized sanitary pads in an hour . Cramps that last longer than 2 days . Foul smelling discharge . Fever above 100.4 degrees F Even if you do not have any  of these symptoms, you should have a follow-up exam to make sure you are healing properly. Your next normal period will usually start again in 4-6 week after the loss. You can get pregnant soon after the loss, so use birth control right away. Finally: Make sure all your questions are answered before during and after any procedure. Follow up with medical care and family planning methods.

## 2018-08-29 NOTE — Telephone Encounter (Signed)
The patient passed a small clot last night, and having a bloody discharge.  Some cramping and lower back hurts.  The patient is anxious and asking for a call back from her provider or nurse, please advise, thanks.

## 2018-08-29 NOTE — Telephone Encounter (Signed)
Pt requested to see the midwives. Appointment made for 3PM 08/29/18.

## 2018-09-12 ENCOUNTER — Encounter: Payer: Managed Care, Other (non HMO) | Admitting: Certified Nurse Midwife

## 2018-09-12 ENCOUNTER — Encounter: Payer: Managed Care, Other (non HMO) | Admitting: Obstetrics and Gynecology

## 2018-09-13 ENCOUNTER — Encounter: Payer: Self-pay | Admitting: Certified Nurse Midwife

## 2018-09-13 ENCOUNTER — Ambulatory Visit (INDEPENDENT_AMBULATORY_CARE_PROVIDER_SITE_OTHER): Payer: Managed Care, Other (non HMO) | Admitting: Certified Nurse Midwife

## 2018-09-13 VITALS — BP 100/68 | HR 76 | Ht 63.0 in | Wt 189.4 lb

## 2018-09-13 DIAGNOSIS — O021 Missed abortion: Secondary | ICD-10-CM

## 2018-09-13 NOTE — Progress Notes (Signed)
Subjective:    Damaris SchoonerKarla A Nevins is a 27 y.o. female. Paula ComptonKarla follow up from missed AB on 08/29/18. She has completed the cytotec as directed and had increased bleeding with passing of tissue. She continues to have some bleeding but states that it has decreased.    The following portions of the patient's history were reviewed and updated as appropriate: allergies, current medications, past family history, past medical history, past social history, past surgical history and problem list.  Review of Systems Pertinent items are noted in HPI.   Objective:     BP 100/68   Pulse 76   Ht 5\' 3"  (1.6 m)   Wt 189 lb 7 oz (85.9 kg)   LMP 06/26/2018   BMI 33.56 kg/m  General:   alert, cooperative, appears stated age and mild distress  Heart: regular rate and rhythm  Lungs: clear to auscultation bilaterally  Abdomen: soft, non-tender, without masses or organomegaly  Pelvic: Exam deferred.                    Assessment:       Missed AB    Plan:    Quantitative hCG today and again in 48 hours. if needed. Referral to grief counseling. Discussed waiting until for 2-3 normal menstrual cycles to begin trying to conceive again. She verbalizes understanding and agrees to plan.PT request work note allowing her to not wear her duty belt because it applies pressure to her abdomen with cause discomfort. Note given.  Will follow up with lab results.   Doreene BurkeAnnie Zacory Fiola, CNM   I attest more than 50% of visit spent reviewing history and discussing lab testing , return in fertility, when she can begin trying to conceive again, and grief counseling. Face to face time

## 2018-09-13 NOTE — Patient Instructions (Signed)

## 2018-09-14 LAB — BETA HCG QUANT (REF LAB): hCG Quant: 136 m[IU]/mL

## 2018-09-18 ENCOUNTER — Telehealth: Payer: Self-pay

## 2018-09-18 ENCOUNTER — Other Ambulatory Visit: Payer: Self-pay | Admitting: Certified Nurse Midwife

## 2018-09-18 DIAGNOSIS — O021 Missed abortion: Secondary | ICD-10-CM

## 2018-09-18 NOTE — Progress Notes (Signed)
Repeat beta hcg ordered.   Doreene BurkeAnnie Faduma Cho, CNM

## 2018-09-18 NOTE — Telephone Encounter (Signed)
Spoke with pt.- explained her options per AT. If pt develops a fever greater than 104, change in bleeding, cramping. She may wait it out to see if she is constipated or come in for an U/S to check for retained tissue. Pt decided to wait it out. Pt was asked to keep the office informed of any changes/ concerns. Pt agreed.

## 2018-09-22 ENCOUNTER — Other Ambulatory Visit: Payer: Managed Care, Other (non HMO)

## 2018-09-22 DIAGNOSIS — O021 Missed abortion: Secondary | ICD-10-CM

## 2018-09-23 LAB — BETA HCG QUANT (REF LAB): hCG Quant: 11 m[IU]/mL

## 2018-09-24 ENCOUNTER — Other Ambulatory Visit: Payer: Self-pay | Admitting: Certified Nurse Midwife

## 2018-09-24 DIAGNOSIS — O021 Missed abortion: Secondary | ICD-10-CM

## 2018-09-24 NOTE — Progress Notes (Signed)
Repeat beta ordered for missed AB. Pt notified via my chart.   Doreene Burke, CNM

## 2018-10-05 ENCOUNTER — Other Ambulatory Visit: Payer: Managed Care, Other (non HMO)

## 2018-10-05 DIAGNOSIS — O021 Missed abortion: Secondary | ICD-10-CM

## 2018-10-06 LAB — BETA HCG QUANT (REF LAB)

## 2018-12-12 ENCOUNTER — Ambulatory Visit (INDEPENDENT_AMBULATORY_CARE_PROVIDER_SITE_OTHER): Payer: Managed Care, Other (non HMO) | Admitting: Certified Nurse Midwife

## 2018-12-12 ENCOUNTER — Encounter: Payer: Self-pay | Admitting: Certified Nurse Midwife

## 2018-12-12 VITALS — BP 100/61 | HR 73 | Ht 63.0 in | Wt 203.3 lb

## 2018-12-12 DIAGNOSIS — Z3201 Encounter for pregnancy test, result positive: Secondary | ICD-10-CM

## 2018-12-12 DIAGNOSIS — N926 Irregular menstruation, unspecified: Secondary | ICD-10-CM | POA: Diagnosis not present

## 2018-12-12 LAB — POCT URINE PREGNANCY: Preg Test, Ur: POSITIVE — AB

## 2018-12-12 NOTE — Patient Instructions (Signed)
Eating Plan for Pregnant Women While you are pregnant, your body will require additional nutrition to help support your growing baby. It is recommended that you consume:  150 additional calories each day during your first trimester.  300 additional calories each day during your second trimester.  300 additional calories each day during your third trimester.  Eating a healthy, well-balanced diet is very important for your health and for your baby's health. You also have a higher need for some vitamins and minerals, such as folic acid, calcium, iron, and vitamin D. What do I need to know about eating during pregnancy?  Do not try to lose weight or go on a diet during pregnancy.  Choose healthy, nutritious foods. Choose  of a sandwich with a glass of milk instead of a candy bar or a high-calorie sugar-sweetened beverage.  Limit your overall intake of foods that have "empty calories." These are foods that have little nutritional value, such as sweets, desserts, candies, sugar-sweetened beverages, and fried foods.  Eat a variety of foods, especially fruits and vegetables.  Take a prenatal vitamin to help meet the additional needs during pregnancy, specifically for folic acid, iron, calcium, and vitamin D.  Remember to stay active. Ask your health care provider for exercise recommendations that are specific to you.  Practice good food safety and cleanliness, such as washing your hands before you eat and after you prepare raw meat. This helps to prevent foodborne illnesses, such as listeriosis, that can be very dangerous for your baby. Ask your health care provider for more information about listeriosis. What does 150 extra calories look like? Healthy options for an additional 150 calories each day could be any of the following:  Plain low-fat yogurt (6-8 oz) with  cup of berries.  1 apple with 2 teaspoons of peanut butter.  Cut-up vegetables with  cup of hummus.  Low-fat chocolate milk  (8 oz or 1 cup).  1 string cheese with 1 medium orange.   of a peanut butter and jelly sandwich on whole-wheat bread (1 tsp of peanut butter).  For 300 calories, you could eat two of those healthy options each day. What is a healthy amount of weight to gain? The recommended amount of weight for you to gain is based on your pre-pregnancy BMI. If your pre-pregnancy BMI was:  Less than 18 (underweight), you should gain 28-40 lb.  18-24.9 (normal), you should gain 25-35 lb.  25-29.9 (overweight), you should gain 15-25 lb.  Greater than 30 (obese), you should gain 11-20 lb.  What if I am having twins or multiples? Generally, pregnant women who will be having twins or multiples may need to increase their daily calories by 300-600 calories each day. The recommended range for total weight gain is 25-54 lb, depending on your pre-pregnancy BMI. Talk with your health care provider for specific guidance about additional nutritional needs, weight gain, and exercise during your pregnancy. What foods can I eat? Grains Any grains. Try to choose whole grains, such as whole-wheat bread, oatmeal, or brown rice. Vegetables Any vegetables. Try to eat a variety of colors and types of vegetables to get a full range of vitamins and minerals. Remember to wash your vegetables well before eating. Fruits Any fruits. Try to eat a variety of colors and types of fruit to get a full range of vitamins and minerals. Remember to wash your fruits well before eating. Meats and Other Protein Sources Lean meats, including chicken, turkey, fish, and lean cuts of beef, veal,   or pork. Make sure that all meats are cooked to "well done." Tofu. Tempeh. Beans. Eggs. Peanut butter and other nut butters. Seafood, such as shrimp, crab, and lobster. If you choose fish, select types that are higher in omega-3 fatty acids, including salmon, herring, mussels, trout, sardines, and pollock. Make sure that all meats are cooked to food-safe  temperatures. Dairy Pasteurized milk and milk alternatives. Pasteurized yogurt and pasteurized cheese. Cottage cheese. Sour cream. Beverages Water. Juices that contain 100% fruit juice or vegetable juice. Caffeine-free teas and decaffeinated coffee. Drinks that contain caffeine are okay to drink, but it is better to avoid caffeine. Keep your total caffeine intake to less than 200 mg each day (12 oz of coffee, tea, or soda) or as directed by your health care provider. Condiments Any pasteurized condiments. Sweets and Desserts Any sweets and desserts. Fats and Oils Any fats and oils. The items listed above may not be a complete list of recommended foods or beverages. Contact your dietitian for more options. What foods are not recommended? Vegetables Unpasteurized (raw) vegetable juices. Fruits Unpasteurized (raw) fruit juices. Meats and Other Protein Sources Cured meats that have nitrates, such as bacon, salami, and hotdogs. Luncheon meats, bologna, or other deli meats (unless they are reheated until they are steaming hot). Refrigerated pate, meat spreads from a meat counter, smoked seafood that is found in the refrigerated section of a store. Raw fish, such as sushi or sashimi. High mercury content fish, such as tilefish, shark, swordfish, and king mackerel. Raw meats, such as tuna or beef tartare. Undercooked meats and poultry. Make sure that all meats are cooked to food-safe temperatures. Dairy Unpasteurized (raw) milk and any foods that have raw milk in them. Soft cheeses, such as feta, queso blanco, queso fresco, Brie, Camembert cheeses, blue-veined cheeses, and Panela cheese (unless it is made with pasteurized milk, which must be stated on the label). Beverages Alcohol. Sugar-sweetened beverages, such as sodas, teas, or energy drinks. Condiments Homemade fermented foods and drinks, such as pickles, sauerkraut, or kombucha drinks. (Store-bought pasteurized versions of these are  okay.) Other Salads that are made in the store, such as ham salad, chicken salad, egg salad, tuna salad, and seafood salad. The items listed above may not be a complete list of foods and beverages to avoid. Contact your dietitian for more information. This information is not intended to replace advice given to you by your health care provider. Make sure you discuss any questions you have with your health care provider. Document Released: 09/27/2014 Document Revised: 05/20/2016 Document Reviewed: 05/28/2014 Elsevier Interactive Patient Education  2018 [employer] Prenatal Care WHAT IS PRENATAL CARE? Prenatal care is the process of caring for a pregnant woman before she gives birth. Prenatal care makes sure that she and her baby remain as healthy as possible throughout pregnancy. Prenatal care may be provided by a midwife, family practice health care provider, or a childbirth and pregnancy specialist (obstetrician). Prenatal care may include physical examinations, testing, treatments, and education on nutrition, lifestyle, and social support services. WHY IS PRENATAL CARE SO IMPORTANT? Early and consistent prenatal care increases the chance that you and your baby will remain healthy throughout your pregnancy. This type of care also decreases a baby's risk of being born too early (prematurely), or being born smaller than expected (small for gestational age). Any underlying medical conditions you may have that could pose a risk during your pregnancy are discussed during prenatal care visits. You will also be monitored regularly for any new   conditions that may arise during your pregnancy so they can be treated quickly and effectively. WHAT HAPPENS DURING PRENATAL CARE VISITS? Prenatal care visits may include the following: Discussion Tell your health care provider about any new signs or symptoms you have experienced since your last visit. These might include:  Nausea or vomiting.  Increased or  decreased level of energy.  Difficulty sleeping.  Back or leg pain.  Weight changes.  Frequent urination.  Shortness of breath with physical activity.  Changes in your skin, such as the development of a rash or itchiness.  Vaginal discharge or bleeding.  Feelings of excitement or nervousness.  Changes in your baby's movements.  You may want to write down any questions or topics you want to discuss with your health care provider and bring them with you to your appointment. Examination During your first prenatal care visit, you will likely have a complete physical exam. Your health care provider will often examine your vagina, cervix, and the position of your uterus, as well as check your heart, lungs, and other body systems. As your pregnancy progresses, your health care provider will measure the size of your uterus and your baby's position inside your uterus. He or she may also examine you for early signs of labor. Your prenatal visits may also include checking your blood pressure and, after about 10-12 weeks of pregnancy, listening to your baby's heartbeat. Testing Regular testing often includes:  Urinalysis. This checks your urine for glucose, protein, or signs of infection.  Blood count. This checks the levels of white and red blood cells in your body.  Tests for sexually transmitted infections (STIs). Testing for STIs at the beginning of pregnancy is routinely done and is required in many states.  Antibody testing. You will be checked to see if you are immune to certain illnesses, such as rubella, that can affect a developing fetus.  Glucose screen. Around 24-28 weeks of pregnancy, your blood glucose level will be checked for signs of gestational diabetes. Follow-up tests may be recommended.  Group B strep. This is a bacteria that is commonly found inside a woman's vagina. This test will inform your health care provider if you need an antibiotic to reduce the amount of this  bacteria in your body prior to labor and childbirth.  Ultrasound. Many pregnant women undergo an ultrasound screening around 18-20 weeks of pregnancy to evaluate the health of the fetus and check for any developmental abnormalities.  HIV (human immunodeficiency virus) testing. Early in your pregnancy, you will be screened for HIV. If you are at high risk for HIV, this test may be repeated during your third trimester of pregnancy.  You may be offered other testing based on your age, personal or family medical history, or other factors. HOW OFTEN SHOULD I PLAN TO SEE MY HEALTH CARE PROVIDER FOR PRENATAL CARE? Your prenatal care check-up schedule depends on any medical conditions you have before, or develop during, your pregnancy. If you do not have any underlying medical conditions, you will likely be seen for checkups:  Monthly, during the first 6 months of pregnancy.  Twice a month during months 7 and 8 of pregnancy.  Weekly starting in the 9th month of pregnancy and until delivery.  If you develop signs of early labor or other concerning signs or symptoms, you may need to see your health care provider more often. Ask your health care provider what prenatal care schedule is best for you. WHAT CAN I DO TO KEEP MYSELF AND   MY BABY AS HEALTHY AS POSSIBLE DURING MY PREGNANCY?  Take a prenatal vitamin containing 400 micrograms (0.4 mg) of folic acid every day. Your health care provider may also ask you to take additional vitamins such as iodine, vitamin D, iron, copper, and zinc.  Take 1500-2000 mg of calcium daily starting at your 20th week of pregnancy until you deliver your baby.  Make sure you are up to date on your vaccinations. Unless directed otherwise by your health care provider: ? You should receive a tetanus, diphtheria, and pertussis (Tdap) vaccination between the 27th and 36th week of your pregnancy, regardless of when your last Tdap immunization occurred. This helps protect your baby  from whooping cough (pertussis) after he or she is born. ? You should receive an annual inactivated influenza vaccine (IIV) to help protect you and your baby from influenza. This can be done at any point during your pregnancy.  Eat a well-rounded diet that includes: ? Fresh fruits and vegetables. ? Lean proteins. ? Calcium-rich foods such as milk, yogurt, hard cheeses, and dark, leafy greens. ? Whole grain breads.  Do noteat seafood high in mercury, including: ? Swordfish. ? Tilefish. ? Shark. ? King mackerel. ? More than 6 oz tuna per week.  Do not eat: ? Raw or undercooked meats or eggs. ? Unpasteurized foods, such as soft cheeses (brie, blue, or feta), juices, and milks. ? Lunch meats. ? Hot dogs that have not been heated until they are steaming.  Drink enough water to keep your urine clear or pale yellow. For many women, this may be 10 or more 8 oz glasses of water each day. Keeping yourself hydrated helps deliver nutrients to your baby and may prevent the start of pre-term uterine contractions.  Do not use any tobacco products including cigarettes, chewing tobacco, or electronic cigarettes. If you need help quitting, ask your health care provider.  Do not drink beverages containing alcohol. No safe level of alcohol consumption during pregnancy has been determined.  Do not use any illegal drugs. These can harm your developing baby or cause a miscarriage.  Ask your health care provider or pharmacist before taking any prescription or over-the-counter medicines, herbs, or supplements.  Limit your caffeine intake to no more than 200 mg per day.  Exercise. Unless told otherwise by your health care provider, try to get 30 minutes of moderate exercise most days of the week. Do not  do high-impact activities, contact sports, or activities with a high risk of falling, such as horseback riding or downhill skiing.  Get plenty of rest.  Avoid anything that raises your body temperature,  such as hot tubs and saunas.  If you own a cat, do not empty its litter box. Bacteria contained in cat feces can cause an infection called toxoplasmosis. This can result in serious harm to the fetus.  Stay away from chemicals such as insecticides, lead, mercury, and cleaning or paint products that contain solvents.  Do not have any X-rays taken unless medically necessary.  Take a childbirth and breastfeeding preparation class. Ask your health care provider if you need a referral or recommendation.  This information is not intended to replace advice given to you by your health care provider. Make sure you discuss any questions you have with your health care provider. Document Released: 12/16/2003 Document Revised: 05/17/2016 Document Reviewed: 02/27/2014 Elsevier Interactive Patient Education  2017 [employer]  

## 2018-12-12 NOTE — Progress Notes (Signed)
Subjective:    Damaris SchoonerKarla A Hammonds is a 27 y.o. female who presents for evaluation of amenorrhea. She believes she could be pregnant. Pregnancy is desired. Sexual Activity: single partner, contraception: none. Current symptoms also include: nausea. Last period was normal.   Patient's last menstrual period was 10/20/2018 (exact date). The following portions of the patient's history were reviewed and updated as appropriate: allergies, current medications, past family history, past medical history, past social history, past surgical history and problem list.  Review of Systems Pertinent items are noted in HPI.     Objective:    BP 100/61   Pulse 73   Ht 5\' 3"  (1.6 m)   Wt 203 lb 5 oz (92.2 kg)   LMP 10/20/2018 (Exact Date)   Breastfeeding No   BMI 36.02 kg/m  General: alert, cooperative, appears stated age and no acute distress    Lab Review Urine HCG: positive    Assessment:    Absence of menstruation.     Plan:  Pregnancy test positive EDC: 07/27/19. Briefly discussed pre-natal care options. Encouraged well-balanced diet, plenty of rest when needed, pre-natal vitamins daily and walking for exercise. Discussed self-help for nausea, avoiding OTC medications until consulting provider or pharmacist, other than Tylenol as needed, minimal caffeine (1-2 cups daily) and avoiding alcohol.Ultrasound for dating and visibility 1-2 wks.  She will schedule her initial nurse OB visit @ 10 wks. and NOB physical exam @ 12 wks.  Feel free to call with any questions.  Doreene BurkeAnnie Raydan Schlabach, CNM

## 2018-12-13 ENCOUNTER — Ambulatory Visit: Payer: Managed Care, Other (non HMO) | Admitting: Obstetrics and Gynecology

## 2018-12-26 ENCOUNTER — Ambulatory Visit (INDEPENDENT_AMBULATORY_CARE_PROVIDER_SITE_OTHER): Payer: Managed Care, Other (non HMO)

## 2018-12-26 DIAGNOSIS — N8312 Corpus luteum cyst of left ovary: Secondary | ICD-10-CM

## 2018-12-26 DIAGNOSIS — Z3A09 9 weeks gestation of pregnancy: Secondary | ICD-10-CM

## 2018-12-26 DIAGNOSIS — O3481 Maternal care for other abnormalities of pelvic organs, first trimester: Secondary | ICD-10-CM | POA: Diagnosis not present

## 2018-12-26 DIAGNOSIS — N926 Irregular menstruation, unspecified: Secondary | ICD-10-CM

## 2018-12-27 NOTE — L&D Delivery Note (Signed)
Delivery Note   Shannon [name] is a 28 y.o. G3P1011 at [redacted]w[redacted]d Estimated Date of Delivery: 07/27/19  PRE-OPERATIVE DIAGNOSIS:  1) [redacted]w[redacted]d pregnancy.   POST-OPERATIVE DIAGNOSIS:  1) [redacted]w[redacted]d pregnancy s/p Vaginal, Spontaneous   Delivery Type: Vaginal, Spontaneous    Delivery Anesthesia: Epidural   Labor Complications:  Oligohydramnios, meconium stained fluid     ESTIMATED BLOOD LOSS: 300 ml    FINDINGS:   1) female infant, Apgar scores of 8   at 1 minute and 9   at 5 minutes and a birthweight of   ounces.    2) Nuchal cord: no  SPECIMENS:   PLACENTA:   Appearance: Intact , 3 vessel cord, cord blood sample collected    Removal: Spontaneous      Disposition:  held per protocol then discarded   DISPOSITION:  Infant to left in stable condition in the delivery room, with L&D personnel and mother,  NARRATIVE SUMMARY: Labor course:  Shannon [name] is a G3P1011 at [redacted]w[redacted]d who presented for induction of labor.  She progressed well in labor with pitocin.  She received the appropriate anesthesia and proceeded to complete dilation. She evidenced good maternal expulsive effort during the second stage. She went on to deliver a viable female infant "Mason". The placenta delivered without problems and was noted to be complete. A perineal and vaginal examination was performed. Perineum intact, labial abrasions hemostatic not in need of repair. The patient tolerated this well.  Philip Aspen, CNM  07/26/2019 3:58 AM

## 2019-01-15 ENCOUNTER — Encounter: Payer: Self-pay | Admitting: Certified Nurse Midwife

## 2019-01-15 ENCOUNTER — Ambulatory Visit (INDEPENDENT_AMBULATORY_CARE_PROVIDER_SITE_OTHER): Payer: Managed Care, Other (non HMO) | Admitting: Certified Nurse Midwife

## 2019-01-15 VITALS — BP 112/67 | HR 80 | Wt 205.1 lb

## 2019-01-15 DIAGNOSIS — O09291 Supervision of pregnancy with other poor reproductive or obstetric history, first trimester: Secondary | ICD-10-CM

## 2019-01-15 DIAGNOSIS — Z8759 Personal history of other complications of pregnancy, childbirth and the puerperium: Secondary | ICD-10-CM | POA: Insufficient documentation

## 2019-01-15 DIAGNOSIS — Z3A12 12 weeks gestation of pregnancy: Secondary | ICD-10-CM

## 2019-01-15 DIAGNOSIS — Z3482 Encounter for supervision of other normal pregnancy, second trimester: Secondary | ICD-10-CM

## 2019-01-15 LAB — POCT URINALYSIS DIPSTICK OB
Bilirubin, UA: NEGATIVE
Glucose, UA: NEGATIVE
Ketones, UA: NEGATIVE
Leukocytes, UA: NEGATIVE
Nitrite, UA: NEGATIVE
PH UA: 5 (ref 5.0–8.0)
POC,PROTEIN,UA: NEGATIVE
Spec Grav, UA: 1.015 (ref 1.010–1.025)
UROBILINOGEN UA: 0.2 U/dL

## 2019-01-15 LAB — OB RESULTS CONSOLE VARICELLA ZOSTER ANTIBODY, IGG: Varicella: IMMUNE

## 2019-01-15 NOTE — Patient Instructions (Signed)

## 2019-01-15 NOTE — Progress Notes (Signed)
NEW OB HISTORY AND PHYSICAL  SUBJECTIVE:       Shannon [name] is a 28 y.o. G70P1001 female, Patient's last menstrual period was 10/20/2018 (exact date)., Estimated Date of Delivery: 07/27/19, [redacted]w[redacted]d, presents today for establishment of Prenatal Care. She has no unusual complaints.    Gynecologic History Patient's last menstrual period was 10/20/2018 (exact date). Normal Contraception: none Last Pap: 12/27/16. Results were: normal  Obstetric History OB History  Gravida Para Term Preterm AB Living  2 1 1  0 0 1  SAB TAB Ectopic Multiple Live Births  0 0 0 0 1    # Outcome Date GA Lbr Len/2nd Weight Sex Delivery Anes PTL Lv  2 Current           1 Term 05/21/16 [redacted]w[redacted]d 302:24 / 01:01 7 lb 14.6 oz (3.59 kg) F Vag-Spont EPI  LIV    Past Medical History:  Diagnosis Date  . Depression   . Migraines   . Post partum depression     Past Surgical History:  Procedure Laterality Date  . CHOLECYSTECTOMY    . FOOT SURGERY Left   . KNEE SURGERY Left     Current Outpatient Medications on File Prior to Visit  Medication Sig Dispense Refill  . Prenatal Vit-Fe Fumarate-FA (PRENATAL MULTIVITAMIN) TABS tablet Take 1 tablet by mouth daily at 12 noon.     No current facility-administered medications on file prior to visit.     Allergies  Allergen Reactions  . Robitussin (Alcohol Free) [Guaifenesin] Itching    Took Benadryl, helped    Social History   Socioeconomic History  . Marital status: Married    Spouse name: Not on file  . Number of children: Not on file  . Years of education: Not on file  . Highest education level: Not on file  Occupational History  . Not on file  Social Needs  . Financial resource strain: Not on file  . Food insecurity:    Worry: Not on file    Inability: Not on file  . Transportation needs:    Medical: Not on file    Non-medical: Not on file  Tobacco Use  . Smoking status: Never Smoker  . Smokeless tobacco: Never Used  Substance and Sexual Activity   . Alcohol use: No    Comment: occasional, while not pregnant  . Drug use: No  . Sexual activity: Yes  Lifestyle  . Physical activity:    Days per week: Not on file    Minutes per session: Not on file  . Stress: Not on file  Relationships  . Social connections:    Talks on phone: Not on file    Gets together: Not on file    Attends religious service: Not on file    Active member of club or organization: Not on file    Attends meetings of clubs or organizations: Not on file    Relationship status: Not on file  . Intimate partner violence:    Fear of current or ex partner: Not on file    Emotionally abused: Not on file    Physically abused: Not on file    Forced sexual activity: Not on file  Other Topics Concern  . Not on file  Social History Narrative  . Not on file    Family History  Problem Relation Age of Onset  . Migraines Mother   . Cancer Maternal Grandmother   . Cancer Maternal Grandfather     The following portions of  the patient's history were reviewed and updated as appropriate: allergies, current medications, past OB history, past medical history, past surgical history, past family history, past social history, and problem list.    OBJECTIVE: Initial Physical Exam (New OB)  GENERAL APPEARANCE: alert, well appearing, in no apparent distress, oriented to person, place and time, overweight HEAD: normocephalic, atraumatic MOUTH: mucous membranes moist, pharynx normal without lesions THYROID: no thyromegaly or masses present BREASTS: no masses noted, no significant tenderness, no palpable axillary nodes, no skin changes LUNGS: clear to auscultation, no wheezes, rales or rhonchi, symmetric air entry HEART: regular rate and rhythm, no murmurs ABDOMEN: soft, nontender, nondistended, no abnormal masses, no epigastric pain, obese, fundus soft, nontender 12 cm and FHT present EXTREMITIES: no redness or tenderness in the calves or thighs, no edema, no limitation in  range of motion, intact peripheral pulses SKIN: normal coloration and turgor, no rashes LYMPH NODES: no adenopathy palpable NEUROLOGIC: alert, oriented, normal speech, no focal findings or movement disorder noted  PELVIC EXAM EXTERNAL GENITALIA: normal appearing vulva with no masses, tenderness or lesions VAGINA: no abnormal discharge or lesions CERVIX: no lesions or cervical motion tenderness UTERUS: gravid ADNEXA: no masses palpable and nontender OB EXAM PELVIMETRY: appears adequate RECTUM: exam not indicated  ASSESSMENT: Normal pregnancy  PLAN: New OB counseling: The patient has been given an overview regarding routine prenatal care. Recommendations regarding diet, weight gain, and exercise in pregnancy were given. Prenatal testing, optional genetic testing, carrier screening and ultrasound use in pregnancy were reviewed. Would like first trimester screening done. Benefits of Breast Feeding were discussed. The patient is encouraged to consider nursing her baby post partum. Follow up 4 wks.   Doreene BurkeAnnie Om Lizotte, CNM

## 2019-01-16 ENCOUNTER — Encounter: Payer: Managed Care, Other (non HMO) | Admitting: Certified Nurse Midwife

## 2019-01-16 LAB — URINALYSIS, ROUTINE W REFLEX MICROSCOPIC
Bilirubin, UA: NEGATIVE
Glucose, UA: NEGATIVE
Ketones, UA: NEGATIVE
Leukocytes, UA: NEGATIVE
Nitrite, UA: NEGATIVE
Protein, UA: NEGATIVE
RBC, UA: NEGATIVE
Specific Gravity, UA: 1.016 (ref 1.005–1.030)
UUROB: 0.2 mg/dL (ref 0.2–1.0)
pH, UA: 6 (ref 5.0–7.5)

## 2019-01-16 LAB — HEMOGLOBIN A1C
Est. average glucose Bld gHb Est-mCnc: 117 mg/dL
Hgb A1c MFr Bld: 5.7 % — ABNORMAL HIGH (ref 4.8–5.6)

## 2019-01-16 LAB — HEPATITIS B SURFACE ANTIGEN: Hepatitis B Surface Ag: NEGATIVE

## 2019-01-16 LAB — ABO AND RH: Rh Factor: POSITIVE

## 2019-01-16 LAB — TSH: TSH: 1.6 u[IU]/mL (ref 0.450–4.500)

## 2019-01-16 LAB — RPR: RPR Ser Ql: NONREACTIVE

## 2019-01-16 LAB — VARICELLA ZOSTER ANTIBODY, IGG: VARICELLA: 863 {index} (ref >165)

## 2019-01-16 LAB — ANTIBODY SCREEN: Antibody Screen: NEGATIVE

## 2019-01-16 LAB — RUBELLA SCREEN: Rubella Antibodies, IGG: 7.03 index (ref >0.99)

## 2019-01-17 LAB — URINE CULTURE: Organism ID, Bacteria: NO GROWTH

## 2019-01-18 ENCOUNTER — Ambulatory Visit (INDEPENDENT_AMBULATORY_CARE_PROVIDER_SITE_OTHER): Payer: Managed Care, Other (non HMO)

## 2019-01-18 ENCOUNTER — Other Ambulatory Visit: Payer: Self-pay | Admitting: Certified Nurse Midwife

## 2019-01-18 ENCOUNTER — Other Ambulatory Visit: Payer: Managed Care, Other (non HMO)

## 2019-01-18 DIAGNOSIS — Z3482 Encounter for supervision of other normal pregnancy, second trimester: Secondary | ICD-10-CM

## 2019-01-18 LAB — GC/CHLAMYDIA PROBE AMP
Chlamydia trachomatis, NAA: NEGATIVE
Neisseria gonorrhoeae by PCR: NEGATIVE

## 2019-01-20 LAB — FIRST TRIMESTER SCREEN W/NT
CRL: 66.9 mm
DIA MoM: 1.25
DIA Value: 230.5 pg/mL
Gest Age-Collect: 12.9 weeks
HCG MOM: 0.62
MATERNAL AGE AT EDD: 28 a
Nuchal Translucency MoM: 1.59
Nuchal Translucency: 2.4 mm
Number of Fetuses: 1
PAPP-A MoM: 0.26
PAPP-A Value: 191.1 ng/mL
Test Results:: POSITIVE — AB
Weight: 205 [lb_av]
hCG Value: 44.3 IU/mL

## 2019-01-22 ENCOUNTER — Other Ambulatory Visit: Payer: Self-pay | Admitting: Certified Nurse Midwife

## 2019-01-22 DIAGNOSIS — O285 Abnormal chromosomal and genetic finding on antenatal screening of mother: Secondary | ICD-10-CM

## 2019-01-22 NOTE — Progress Notes (Signed)
Pt notified of results. Order placed for MFM referral. All questions answered.   Doreene Burke, CNM

## 2019-01-25 ENCOUNTER — Ambulatory Visit
Admission: RE | Admit: 2019-01-25 | Discharge: 2019-01-25 | Disposition: A | Discharge status: Home / Self Care | Payer: Self-pay | Source: Ambulatory Visit | Attending: Maternal & Fetal Medicine | Admitting: Maternal & Fetal Medicine

## 2019-01-25 ENCOUNTER — Other Ambulatory Visit: Payer: Self-pay

## 2019-01-25 VITALS — BP 136/79 | HR 83 | Temp 98.2°F | Resp 20 | Ht 63.6 in | Wt 203.0 lb

## 2019-01-25 DIAGNOSIS — O289 Unspecified abnormal findings on antenatal screening of mother: Secondary | ICD-10-CM

## 2019-01-25 DIAGNOSIS — O281 Abnormal biochemical finding on antenatal screening of mother: Secondary | ICD-10-CM

## 2019-01-25 DIAGNOSIS — Z3A13 13 weeks gestation of pregnancy: Secondary | ICD-10-CM | POA: Insufficient documentation

## 2019-01-25 HISTORY — DX: Anxiety disorder, unspecified: F41.9

## 2019-01-25 NOTE — Progress Notes (Signed)
Referring physician:  Doreene Burke, CNM Length of Consultation:  40 minutes   Shannon [name] was referred to [facility] for genetic counseling because of an increased chance of Down syndrome and Trisomy 18 by first trimester screening.  This note summarizes the information we discussed.   We provided background information on first trimester screening.  The levels of certain proteins in the maternal blood and the ultrasound measurement of the nuchal translucency can help determine if a pregnancy is at increased for certain birth defects or problems.  However, it cannot diagnose or rule out these defects.  An abnormal screen does not necessarily mean that the baby has a problem, but that additional testing should be made available.  First trimester screening can identify up to 90% of Down syndrome cases and up to 97% of trisomy 71 or trisomy 18 cases.  Chromosomes are the inherited structures that contain our instructions for development (genes).  Each cell in our body normally has 46 chromosomes.  Rarely, when an egg and sperm unite, an extra or missing chromosome can be passed on to the baby by mistake.  These types of chromosome problems typically cause mental retardation and might also cause birth defects.  We discussed Down syndrome (an extra chromosome #21), trisomy 85 (an extra chromosome #13), and trisomy 77 (an extra chromosome #18).    The first trimester screening increased the chance of both Trisomy 28 and Trisomy 21 in this pregnancy.  Before maternal serum screening, the age-related chance of Trisomy 18 in the pregnancy was approximately 1 in 1,619 and the chance for Down syndrome was estimated to be 1 in 827.  Given the results of the screening, the chance is now estimated to be 1 in 190 for Trisomy 18 and 1 in 160 for Down syndrome.  This means that the chance the baby does not have either condition is greater than 98 percent.   The following prenatal  testing options for this pregnancy:  The chorionic villus sampling procedure is available for first trimester chromosome analysis.  This involves the withdrawal of a small amount of chorionic villi (tissue from the developing placenta).  Risk of pregnancy loss is estimated to be approximately 1 in 200 to 1 in 100 (0.5 to 1%).  There is approximately a 1% (1 in 100) chance that the CVS chromosome results will be unclear.  Chorionic villi cannot be tested for neural tube defects.     Targeted ultrasound uses high frequency sound waves to create an image of the developing fetus.  An ultrasound is often recommended as a routine means of evaluating the pregnancy.  It is also used to screen for fetal anatomy problems (for example, a heart defect) that might be suggestive of a chromosomal or other abnormality.    Amniocentesis involves the removal of a small amount of amniotic fluid from the sac surrounding the fetus with the use of a thin needle inserted through the maternal abdomen and uterus.  Ultrasound guidance is used throughout the procedure.  This testing is usually performed between 15 to 18 weeks of pregnancy.  Fetal cells are directly evaluated and > 98% of neural tube defects can be detected.  The main risks to this procedure include complications leading to miscarriage in less than 1 in 200 cases (0.5%).    We also reviewed the availability of cell free fetal DNA testing from maternal blood to determine whether or not the baby may have Down syndrome, trisomy 30,  or trisomy 5418.  This test utilizes a maternal blood sample and DNA sequencing technology to isolate circulating cell free fetal DNA from maternal plasma.  The fetal DNA can then be analyzed for DNA sequences that are derived from the three most common chromosomes involved in aneuploidy, chromosomes 13, 18, and 21.  If the overall amount of DNA is greater than the expected level for any of these chromosomes, aneuploidy is suspected.  While we do  not consider it a replacement for invasive testing and karyotype analysis, a negative result from this testing would be reassuring, though not a guarantee of a normal chromosome complement for the baby.  An abnormal result is certainly suggestive of an abnormal chromosome complement, though we would still recommend CVS or amniocentesis to confirm any findings from this testing.  Cystic Fibrosis and Spinal Muscular Atrophy (SMA) screening were also discussed with the patient. Both conditions are recessive, which means that both parents must be carriers in order to have a child with the disease.  Cystic fibrosis (CF) is one of the most common genetic conditions in persons of Caucasian ancestry.  This condition occurs in approximately 1 in 2,500 Caucasian persons and results in thickened secretions in the lungs, digestive, and reproductive systems.  For a baby to be at risk for having CF, both of the parents must be carriers for this condition.  Approximately 1 in 3425 Caucasian persons is a carrier for CF.  Current carrier testing looks for the most common mutations in the gene for CF and can detect approximately 90% of carriers in the Caucasian population.  This means that the carrier screening can greatly reduce, but cannot eliminate, the chance for an individual to have a child with CF.  If an individual is found to be a carrier for CF, then carrier testing would be available for the partner. As part of [place]'s newborn screening profile, all babies born in the state of [place] will have a two-tier screening process.  Specimens are first tested to determine the concentration of immunoreactive trypsinogen (IRT).  The top 5% of specimens with the highest IRT values then undergo DNA testing using a panel of over 40 common CF mutations. SMA is a neurodegenerative disorder that leads to atrophy of skeletal muscle and overall weakness.  This condition is also more prevalent in the Caucasian population,  with 1 in 40-1 in 60 persons being a carrier and 1 in 6,000-1 in 10,000 children being affected.  There are multiple forms of the disease, with some causing death in infancy to other forms with survival into adulthood.  The genetics of SMA is complex, but carrier screening can detect up to 95% of carriers in the Caucasian population.  Similar to CF, a negative result can greatly reduce, but cannot eliminate, the chance to have a child with SMA.   We obtained a detailed family history and pregnancy history.  The family history was reported to be unremarkable for birth defects, developmental delays, recurrent pregnancy loss or known chromosome abnormalities.  There were several family members with diabetes and hypertension, which we reviewed may have both genetic and lifestyle components.  Ms. Jearld LeschSwiggett and her husband reported that this is their third pregnancy.  They have a healthy 28 year old daughter and had one early miscarriage.  She reported no complications or exposures in this pregnancy that would be expected to increase the risk for birth defects.  Further review of her first trimester screening results also indicated a low  level of PAPP-A (0.24 MoM).  PAPP-A results at or below the 5th percentile have been associated with and increased chance for adverse obstetrical outcomes, including preeclampsia.  For this reason, a third trimester ultrasound for growth could be considered.  An ultrasound was performed at the time of this visit which showed dating consistent with 13 weeks, 6 days. Within the limits of the ultrasound, no anomalies were noted, though given the gestational age the detailed fetal anatomy could not be assessed.  She was scheduled to return for a detailed ultrasound in 4 weeks.  Thank you for involving us in the care of this patient.  Ms.  Binnie RailKarla A Bistline was encouraged to call with questions or concerns. We can be contacted at [phone].   Tests Ordered at Visit: MaterniT21  PLUS with [facility]   Cherly Andersoneborah F. Alireza Pollack, MS, CGC

## 2019-01-26 ENCOUNTER — Telehealth: Payer: Self-pay | Admitting: Obstetrics and Gynecology

## 2019-01-26 NOTE — Telephone Encounter (Signed)
The patient called and stated that she would like to speak with a nurse in regards to needing an update on the status of her recent lab results. No other information was disclosed. Please advise.

## 2019-01-29 NOTE — Telephone Encounter (Signed)
Was sent my chart message

## 2019-01-30 LAB — MATERNIT21 PLUS CORE+SCA
CHROMOSOME 18: NEGATIVE
Chromosome 13: NEGATIVE
Chromosome 21: NEGATIVE
Fetal Fraction: 9
Y Chromosome: DETECTED

## 2019-02-01 ENCOUNTER — Telehealth: Payer: Self-pay | Admitting: Obstetrics and Gynecology

## 2019-02-01 NOTE — Progress Notes (Signed)
Pt seen by me, agree with assessment and plan as outlined in [facility] Wells's note 

## 2019-02-01 NOTE — Telephone Encounter (Signed)
The patient was informed of the results of her recent MaterniT21 testing which yielded NEGATIVE results.  The patient's specimen showed DNA consistent with two copies of chromosomes 21, 18 and 13.  The sensitivity for trisomy 21, trisomy 18 and trisomy 13 using this testing are reported as 99.1%, 99.9% and 91.7% respectively.  Thus, while the results of this testing are highly accurate, they are not considered diagnostic at this time.  Should more definitive information be desired, the patient may still consider amniocentesis.   As requested to know by the patient, sex chromosome analysis was included for this sample.  Results are consistent with a female fetus (Y chromosome material was detected). This is predicted with >99% accuracy.  A maternal serum AFP only should be considered if screening for neural tube defects is desired.  We may be reached at [phone] with any questions or concerns.   Harley Mccartney F. Nycholas Rayner, MS, CGC   

## 2019-02-13 ENCOUNTER — Ambulatory Visit (INDEPENDENT_AMBULATORY_CARE_PROVIDER_SITE_OTHER): Payer: Managed Care, Other (non HMO) | Admitting: Obstetrics and Gynecology

## 2019-02-13 ENCOUNTER — Other Ambulatory Visit: Payer: Managed Care, Other (non HMO)

## 2019-02-13 VITALS — BP 113/64 | HR 79 | Wt 202.5 lb

## 2019-02-13 DIAGNOSIS — Z3492 Encounter for supervision of normal pregnancy, unspecified, second trimester: Secondary | ICD-10-CM

## 2019-02-13 DIAGNOSIS — Z131 Encounter for screening for diabetes mellitus: Secondary | ICD-10-CM

## 2019-02-13 LAB — POCT URINALYSIS DIPSTICK OB
Blood, UA: NEGATIVE
Glucose, UA: NEGATIVE
Ketones, UA: NEGATIVE
Leukocytes, UA: NEGATIVE
Nitrite, UA: NEGATIVE
PH UA: 6 (ref 5.0–8.0)
POC,PROTEIN,UA: NEGATIVE
Spec Grav, UA: 1.02 (ref 1.010–1.025)
Urobilinogen, UA: 0.2 E.U./dL

## 2019-02-13 NOTE — Progress Notes (Signed)
ROB. Early glucola done today., otherwise pt is doing well.

## 2019-02-13 NOTE — Progress Notes (Signed)
ROB and early glucola- doing well, discussed [facility] visit, has anatomy scan with them on the 28th.

## 2019-02-14 LAB — GLUCOSE TOLERANCE, 1 HOUR: Glucose, 1Hr PP: 114 mg/dL (ref 65–199)

## 2019-02-19 ENCOUNTER — Other Ambulatory Visit: Payer: Self-pay

## 2019-02-19 DIAGNOSIS — O281 Abnormal biochemical finding on antenatal screening of mother: Secondary | ICD-10-CM

## 2019-02-22 ENCOUNTER — Ambulatory Visit
Admission: RE | Admit: 2019-02-22 | Discharge: 2019-02-22 | Disposition: A | Discharge status: Home / Self Care | Payer: Self-pay | Source: Ambulatory Visit | Attending: Maternal & Fetal Medicine | Admitting: Maternal & Fetal Medicine

## 2019-02-22 DIAGNOSIS — Z3A17 17 weeks gestation of pregnancy: Secondary | ICD-10-CM | POA: Insufficient documentation

## 2019-02-22 DIAGNOSIS — O281 Abnormal biochemical finding on antenatal screening of mother: Secondary | ICD-10-CM | POA: Insufficient documentation

## 2019-02-23 ENCOUNTER — Telehealth: Payer: Self-pay | Admitting: Certified Nurse Midwife

## 2019-02-23 NOTE — Telephone Encounter (Signed)
The patient called and stated that her child has tested positive for the flu. The patient would like to know what precautions she should take and if she should take anything. The patient was informed that her message will be reviewed on Monday due to it being closing time. Please advise.

## 2019-02-26 ENCOUNTER — Telehealth: Payer: Self-pay

## 2019-02-26 NOTE — Telephone Encounter (Signed)
mychart message sent

## 2019-03-13 ENCOUNTER — Other Ambulatory Visit: Payer: Self-pay

## 2019-03-13 ENCOUNTER — Ambulatory Visit (INDEPENDENT_AMBULATORY_CARE_PROVIDER_SITE_OTHER): Payer: Managed Care, Other (non HMO) | Admitting: Certified Nurse Midwife

## 2019-03-13 VITALS — BP 104/58 | HR 79 | Wt 208.5 lb

## 2019-03-13 DIAGNOSIS — Z3492 Encounter for supervision of normal pregnancy, unspecified, second trimester: Secondary | ICD-10-CM

## 2019-03-13 LAB — POCT URINALYSIS DIPSTICK OB
Bilirubin, UA: NEGATIVE
GLUCOSE, UA: NEGATIVE
Ketones, UA: NEGATIVE
Leukocytes, UA: NEGATIVE
Nitrite, UA: NEGATIVE
POC,PROTEIN,UA: NEGATIVE
SPEC GRAV UA: 1.02 (ref 1.010–1.025)
Urobilinogen, UA: 0.2 E.U./dL
pH, UA: 6 (ref 5.0–8.0)

## 2019-03-13 NOTE — Patient Instructions (Signed)

## 2019-03-13 NOTE — Progress Notes (Signed)
ROB anatomy u/s completed 2/27 with MFM. Results:  "There is a singleton gestation at [redacted]w[redacted]d by LMP and [place] at  [facility] on 12/26/18 at [redacted]w[redacted]d.  The fetal biometry correlates with established dating."   Unsure if she is feeling movement. Fetal movement heard with dipolar. Discussed common musculoskeletal discomforts of pregnancy. Pt verbalizes understanding. Follow up 4 wks with Melody   Doreene Burke, CNM

## 2019-04-09 ENCOUNTER — Telehealth: Payer: Self-pay | Admitting: *Deleted

## 2019-04-09 NOTE — Telephone Encounter (Signed)
Coronavirus (COVID-19) Are you at risk?  Are you at risk for the Coronavirus (COVID-19)?  To be considered HIGH RISK for Coronavirus (COVID-19), you have to meet the following criteria:  . Traveled to [place], [place], [place], [place] or [place]; or in the [place] to [place], [place], [place], or [place]; and have fever, cough, and shortness of breath within the last 2 weeks of travel OR . Been in close contact with a person diagnosed with COVID-19 within the last 2 weeks and have fever, cough, and shortness of breath . IF YOU DO NOT MEET THESE CRITERIA, YOU ARE CONSIDERED LOW RISK FOR COVID-19.  What to do if you are HIGH RISK for COVID-19?  . If you are having a medical emergency, call 911. . Seek medical care right away. Before you go to a doctor's office, urgent care or emergency department, call ahead and tell them about your recent travel, contact with someone diagnosed with COVID-19, and your symptoms. You should receive instructions from your physician's office regarding next steps of care.  . When you arrive at healthcare provider, tell the healthcare staff immediately you have returned from visiting [place], [place], [place], [place] or [place]; or traveled in the [place] to [place], [place], [place], or [place]; in the last two weeks or you have been in close contact with a person diagnosed with COVID-19 in the last 2 weeks.   . Tell the health care staff about your symptoms: fever, cough and shortness of breath. . After you have been seen by a medical provider, you will be either: o Tested for (COVID-19) and discharged home on quarantine except to seek medical care if symptoms worsen, and asked to  - Stay home and avoid contact with others until you get your results (4-5 days)  - Avoid travel on public transportation if possible (such as bus, train, or airplane) or o Sent to the Emergency Department by EMS for evaluation, COVID-19 testing, and possible  admission depending on your condition and test results.  What to do if you are LOW RISK for COVID-19?  Reduce your risk of any infection by using the same precautions used for avoiding the common cold or flu:  . Wash your hands often with soap and warm water for at least 20 seconds.  If soap and water are not readily available, use an alcohol-based hand sanitizer with at least 60% alcohol.  . If coughing or sneezing, cover your mouth and nose by coughing or sneezing into the elbow areas of your shirt or coat, into a tissue or into your sleeve (not your hands). . Avoid shaking hands with others and consider head nods or verbal greetings only. . Avoid touching your eyes, nose, or mouth with unwashed hands.  . Avoid close contact with people who are sick. . Avoid places or events with large numbers of people in one location, like concerts or sporting events. . Carefully consider travel plans you have or are making. . If you are planning any travel outside or inside the [place], visit the CDC's Travelers' Health webpage for the latest health notices. . If you have some symptoms but not all symptoms, continue to monitor at home and seek medical attention if your symptoms worsen. . If you are having a medical emergency, call 911.   ADDITIONAL HEALTHCARE OPTIONS FOR PATIENTS  [facility] / e-Visit: [email or web address]         [facility]: [phone]  [facility]: [phone]                   [facility]: [phone]   Called pt denies any sx. Amy Clontz, CMA  

## 2019-04-10 ENCOUNTER — Ambulatory Visit (INDEPENDENT_AMBULATORY_CARE_PROVIDER_SITE_OTHER): Payer: Managed Care, Other (non HMO) | Admitting: Obstetrics and Gynecology

## 2019-04-10 ENCOUNTER — Encounter: Payer: Self-pay | Admitting: Obstetrics and Gynecology

## 2019-04-10 ENCOUNTER — Other Ambulatory Visit: Payer: Self-pay

## 2019-04-10 VITALS — BP 91/50 | HR 83 | Wt 210.1 lb

## 2019-04-10 DIAGNOSIS — Z3492 Encounter for supervision of normal pregnancy, unspecified, second trimester: Secondary | ICD-10-CM

## 2019-04-10 LAB — POCT URINALYSIS DIPSTICK OB
Bilirubin, UA: NEGATIVE
Blood, UA: NEGATIVE
Glucose, UA: NEGATIVE
Ketones, UA: NEGATIVE
Leukocytes, UA: NEGATIVE
Nitrite, UA: NEGATIVE
POC,PROTEIN,UA: NEGATIVE
Spec Grav, UA: 1.025 (ref 1.010–1.025)
Urobilinogen, UA: 0.2 E.U./dL
pH, UA: 6 (ref 5.0–8.0)

## 2019-04-10 NOTE — Patient Instructions (Signed)
Q: Why are visitor restrictions different for maternity care areas? [place] is restricting visitors for the duration of the patient's hospitalization. The birth of a child involves the mother, considered the patient, and a birthing partner. These are unprecedented times and we are making the exception to allow a birthing partner to be a part of the patient unit. No other guests will be allowed in our [facility] at [facility] and at [facility].  Q: Are credentialed doulas allowed to support their existing patients? We acknowledge the value these doula partnerships offer our care teams and many birthing families in our communities. Each laboring mother is allowed one birthing partner of the patient's choosing for her entire hospitalization.  Q: Are visitor restrictions different for hospitalized children? Pediatric patients (infants and children under 17 years of age), such as those in the Children's Unit, Pediatric ICU and NICU, will be allowed two visitors (parents or legal guardians)  Q: Are pregnant women at an increased risk for COVID-19? The American College of Obstetricians and Gynecologists (ACOG) is monitoring closely the coronavirus pandemic. With the limited information available, data does not indicate pregnant women are at an increased risk. However, pregnant women are known to be at greater risk for respiratory infections like flu. With that in mind, expectant mothers are considered an at-risk population for COVID-19, according to ACOG.  Q: Are newborns at an increased risk for COVID-19? A limited sample of COVID-19 data with newborns indicates the virus is not transferred to the infant during pregnancy. However, postpartum separation is recommended by the Centers for Disease Control (CDC). As a result [facility] recommends and strongly encourages temporary separation of moms and babies who test positive for COVID-19 or are awaiting results to rule out  COVID-19 based on CDC guidelines.  Q: If you have a suspected case of COVID-19, is the NICU couplet care room an option? No. If either patient is considered at-risk for having COVID-19, the [facility] at [facility] will not use the NICU couplet care rooms for that family.  Q: [place] is urging that elective procedures be postponed. What is considered elective for women's and children's service line? NOT ELECTIVE: Obstetric procedures, even those with an element of choice on timing, are not considered elective. Circumcisions are considered elective procedures, however, these do not deplete blood products and other resources, which is the spirit in which the COVID-19 postponement of elective procedures was intended. Therefore, circumcisions will be allowed.  ELECTIVE: Postpartum tubal ligations are considered elective and should be postponed.  [facility] [facility] supports as much as possible the medical care team working with the patient's individual needs to address timing during these unprecedented times. We seek the support of our medical care team in preserving needed resources throughout our crisis response to COVID-19.  Q: How does COVID-19 impact breastfeeding? Breastmilk is safe for your baby - even if the mother has tested positive for COVID-19. We suggest the mother pump her milk and have a healthy family member feed the baby to protect the baby from getting the virus.  If a COVID-19+ mother decides to breastfeed after discharge, we suggest proper protective equipment be worn and hand hygiene be performed before and after feeding the infant.  Q: Should we urge patients to avoid baby showers and large gatherings? Yes. As has been recommended for all citizens in our communities, gatherings of 10 or more should be avoided - pregnant or not. Seek creative options   for "hosting" baby showers through electronic means that honor the request for social distancing during this  time of heightened awareness.  Q: Should patients miss their prenatal appointments? No. Prenatal visits are NOT elective. While we want to limit contact and exposure, prenatal care is vital right now. Contact your physician's office if you have concerns about your visits. We are limiting outpatient office visits to the patient and one guest in order to reduce the potential for exposure.  Q: What if a pregnant woman feels sick? Should she miss her prenatal visit then? A pregnant woman experiencing coronavirus-like symptoms (i.e., cough, fever, difficulty breathing, shortness of breath, gastrointestinal issues) should contact her pregnancy care provider by phone. Her medical professional can best determine whether she should use a video visit or possibly go to a collection site to be tested for COVID-19. Contacting her primary care provider or her pregnancy care provider is her first step.  Q: What can I do about childbirth education? All the classes are cancelled. The [facility] will offer online learning to support mothers on their journey.  We currently offer Understanding Childbirth, Understanding Breastfeeding and Understanding Newborn Care as an online class.  Please visit our website, [email or web address], to register for an online class.  Q: How can I keep from getting COVID-19? Together, we can reduce the risk of exposure to the virus and help you and your family remain healthy and safe. One of the best ways to protect yourself is to wash your hands frequently using soap and water. Also, you should avoid touching your eyes, nose and mouth with unwashed hands, avoid physical contact with others and practice social distancing.   

## 2019-04-10 NOTE — Progress Notes (Signed)
ROB- doing well, glucola next visit. Discussed breast feeding-had trouble with production last time, also does not want an IUD, as she had one in past and her body was spontaneously expulsing it after a year, considering OCPs

## 2019-05-09 ENCOUNTER — Telehealth: Payer: Self-pay | Admitting: *Deleted

## 2019-05-09 ENCOUNTER — Telehealth: Payer: Self-pay

## 2019-05-09 NOTE — Telephone Encounter (Signed)
Coronavirus (COVID-19) Are you at risk?  Are you at risk for the Coronavirus (COVID-19)?  To be considered HIGH RISK for Coronavirus (COVID-19), you have to meet the following criteria:  . Traveled to [place], [place], [place], [place] or [place]; or in the [place] to [place], [place], [place], or [place]; and have fever, cough, and shortness of breath within the last 2 weeks of travel OR . Been in close contact with a person diagnosed with COVID-19 within the last 2 weeks and have fever, cough, and shortness of breath . IF YOU DO NOT MEET THESE CRITERIA, YOU ARE CONSIDERED LOW RISK FOR COVID-19.  What to do if you are HIGH RISK for COVID-19?  . If you are having a medical emergency, call 911. . Seek medical care right away. Before you go to a doctor's office, urgent care or emergency department, call ahead and tell them about your recent travel, contact with someone diagnosed with COVID-19, and your symptoms. You should receive instructions from your physician's office regarding next steps of care.  . When you arrive at healthcare provider, tell the healthcare staff immediately you have returned from visiting [place], [place], [place], [place] or [place]; or traveled in the [place] to [place], [place], [place], or [place]; in the last two weeks or you have been in close contact with a person diagnosed with COVID-19 in the last 2 weeks.   . Tell the health care staff about your symptoms: fever, cough and shortness of breath. . After you have been seen by a medical provider, you will be either: o Tested for (COVID-19) and discharged home on quarantine except to seek medical care if symptoms worsen, and asked to  - Stay home and avoid contact with others until you get your results (4-5 days)  - Avoid travel on public transportation if possible (such as bus, train, or airplane) or o Sent to the Emergency Department by EMS for evaluation, COVID-19 testing, and possible  admission depending on your condition and test results.  What to do if you are LOW RISK for COVID-19?  Reduce your risk of any infection by using the same precautions used for avoiding the common cold or flu:  . Wash your hands often with soap and warm water for at least 20 seconds.  If soap and water are not readily available, use an alcohol-based hand sanitizer with at least 60% alcohol.  . If coughing or sneezing, cover your mouth and nose by coughing or sneezing into the elbow areas of your shirt or coat, into a tissue or into your sleeve (not your hands). . Avoid shaking hands with others and consider head nods or verbal greetings only. . Avoid touching your eyes, nose, or mouth with unwashed hands.  . Avoid close contact with people who are sick. . Avoid places or events with large numbers of people in one location, like concerts or sporting events. . Carefully consider travel plans you have or are making. . If you are planning any travel outside or inside the [place], visit the CDC's Travelers' Health webpage for the latest health notices. . If you have some symptoms but not all symptoms, continue to monitor at home and seek medical attention if your symptoms worsen. . If you are having a medical emergency, call 911.   ADDITIONAL HEALTHCARE OPTIONS FOR PATIENTS  [facility] / e-Visit: [email or web address]         [facility]: [phone]  [place]   [facility]: [phone]                   [facility]: [phone]   Spoke with pt denies any sx.  Amy Clontz, CMA 

## 2019-05-09 NOTE — Telephone Encounter (Signed)
Coronavirus (COVID-19) Are you at risk?  Are you at risk for the Coronavirus (COVID-19)?  To be considered HIGH RISK for Coronavirus (COVID-19), you have to meet the following criteria:  . Traveled to [place], [place], [place], [place] or [place]; or in the [place] to [place], [place], [place], or [place]; and have fever, cough, and shortness of breath within the last 2 weeks of travel OR . Been in close contact with a person diagnosed with COVID-19 within the last 2 weeks and have fever, cough, and shortness of breath . IF YOU DO NOT MEET THESE CRITERIA, YOU ARE CONSIDERED LOW RISK FOR COVID-19.  What to do if you are HIGH RISK for COVID-19?  . If you are having a medical emergency, call 911. . Seek medical care right away. Before you go to a doctor's office, urgent care or emergency department, call ahead and tell them about your recent travel, contact with someone diagnosed with COVID-19, and your symptoms. You should receive instructions from your physician's office regarding next steps of care.  . When you arrive at healthcare provider, tell the healthcare staff immediately you have returned from visiting [place], [place], [place], [place] or [place]; or traveled in the [place] to [place], [place], [place], or [place]; in the last two weeks or you have been in close contact with a person diagnosed with COVID-19 in the last 2 weeks.   . Tell the health care staff about your symptoms: fever, cough and shortness of breath. . After you have been seen by a medical provider, you will be either: o Tested for (COVID-19) and discharged home on quarantine except to seek medical care if symptoms worsen, and asked to  - Stay home and avoid contact with others until you get your results (4-5 days)  - Avoid travel on public transportation if possible (such as bus, train, or airplane) or o Sent to the Emergency Department by EMS for evaluation, COVID-19 testing, and possible  admission depending on your condition and test results.  What to do if you are LOW RISK for COVID-19?  Reduce your risk of any infection by using the same precautions used for avoiding the common cold or flu:  . Wash your hands often with soap and warm water for at least 20 seconds.  If soap and water are not readily available, use an alcohol-based hand sanitizer with at least 60% alcohol.  . If coughing or sneezing, cover your mouth and nose by coughing or sneezing into the elbow areas of your shirt or coat, into a tissue or into your sleeve (not your hands). . Avoid shaking hands with others and consider head nods or verbal greetings only. . Avoid touching your eyes, nose, or mouth with unwashed hands.  . Avoid close contact with people who are sick. . Avoid places or events with large numbers of people in one location, like concerts or sporting events. . Carefully consider travel plans you have or are making. . If you are planning any travel outside or inside the [place], visit the CDC's Travelers' Health webpage for the latest health notices. . If you have some symptoms but not all symptoms, continue to monitor at home and seek medical attention if your symptoms worsen. . If you are having a medical emergency, call 911.   ADDITIONAL HEALTHCARE OPTIONS FOR PATIENTS  [facility] / e-Visit: [email or web address]         [facility]: [phone]  [place]   [facility]: [phone]                   [facility]: [phone]  Prescreened. cm 

## 2019-05-10 ENCOUNTER — Other Ambulatory Visit (INDEPENDENT_AMBULATORY_CARE_PROVIDER_SITE_OTHER): Payer: Managed Care, Other (non HMO)

## 2019-05-10 ENCOUNTER — Other Ambulatory Visit (HOSPITAL_COMMUNITY)
Admission: RE | Admit: 2019-05-10 | Discharge: 2019-05-10 | Disposition: A | Discharge status: Home / Self Care | Payer: Managed Care, Other (non HMO) | Source: Ambulatory Visit | Attending: Certified Nurse Midwife | Admitting: Certified Nurse Midwife

## 2019-05-10 ENCOUNTER — Encounter: Payer: Self-pay | Admitting: Certified Nurse Midwife

## 2019-05-10 ENCOUNTER — Other Ambulatory Visit: Payer: Self-pay | Admitting: Certified Nurse Midwife

## 2019-05-10 ENCOUNTER — Other Ambulatory Visit: Payer: Self-pay

## 2019-05-10 ENCOUNTER — Ambulatory Visit (INDEPENDENT_AMBULATORY_CARE_PROVIDER_SITE_OTHER): Payer: Managed Care, Other (non HMO) | Admitting: Certified Nurse Midwife

## 2019-05-10 VITALS — BP 94/68 | HR 90 | Wt 215.4 lb

## 2019-05-10 DIAGNOSIS — Z8751 Personal history of pre-term labor: Secondary | ICD-10-CM | POA: Insufficient documentation

## 2019-05-10 DIAGNOSIS — Z3A28 28 weeks gestation of pregnancy: Secondary | ICD-10-CM

## 2019-05-10 DIAGNOSIS — R7303 Prediabetes: Secondary | ICD-10-CM | POA: Insufficient documentation

## 2019-05-10 DIAGNOSIS — Z3493 Encounter for supervision of normal pregnancy, unspecified, third trimester: Secondary | ICD-10-CM | POA: Insufficient documentation

## 2019-05-10 DIAGNOSIS — O26893 Other specified pregnancy related conditions, third trimester: Secondary | ICD-10-CM

## 2019-05-10 DIAGNOSIS — Z130 Encounter for screening for diseases of the blood and blood-forming organs and certain disorders involving the immune mechanism: Secondary | ICD-10-CM

## 2019-05-10 DIAGNOSIS — N898 Other specified noninflammatory disorders of vagina: Secondary | ICD-10-CM

## 2019-05-10 DIAGNOSIS — Z23 Encounter for immunization: Secondary | ICD-10-CM

## 2019-05-10 DIAGNOSIS — Z113 Encounter for screening for infections with a predominantly sexual mode of transmission: Secondary | ICD-10-CM

## 2019-05-10 DIAGNOSIS — O09213 Supervision of pregnancy with history of pre-term labor, third trimester: Secondary | ICD-10-CM

## 2019-05-10 DIAGNOSIS — Z131 Encounter for screening for diabetes mellitus: Secondary | ICD-10-CM

## 2019-05-10 DIAGNOSIS — Z5320 Procedure and treatment not carried out because of patient's decision for unspecified reasons: Secondary | ICD-10-CM | POA: Insufficient documentation

## 2019-05-10 LAB — POCT URINALYSIS DIPSTICK OB
Bilirubin, UA: NEGATIVE
Blood, UA: NEGATIVE
Glucose, UA: NEGATIVE
Ketones, UA: NEGATIVE
Nitrite, UA: NEGATIVE
POC,PROTEIN,UA: NEGATIVE
Spec Grav, UA: 1.01 (ref 1.010–1.025)
Urobilinogen, UA: 0.2 E.U./dL
pH, UA: 7.5 (ref 5.0–8.0)

## 2019-05-10 MED ORDER — TETANUS-DIPHTH-ACELL PERTUSSIS 5-2.5-18.5 LF-MCG/0.5 IM SUSP
0.5000 mL | Freq: Once | INTRAMUSCULAR | Status: AC
  Administered 2019-05-10: 0.5 mL via INTRAMUSCULAR

## 2019-05-10 NOTE — Progress Notes (Signed)
ROB-Reports bilateral hip pain and lower back pain with menstrual like cramping for the last week. FFN and vaginal swab collected, see orders. Discussed home treatment measures including use of abdominal support. TDaP given. Blood transfusion consent reviewed and signed. Plan IV meds and epidural for pain relief in labor. Anticipatory guidance regarding course of prenatal care. Reviewed red flag symptoms and when to call. RTC x 2 weeks for ROB or sooner if needed.

## 2019-05-10 NOTE — Patient Instructions (Signed)
WHAT OB PATIENTS CAN EXPECT   Confirmation of pregnancy and ultrasound ordered if medically indicated-[redacted] weeks gestation  New OB (NOB) intake with nurse and New OB (NOB) labs- [redacted] weeks gestation  New OB (NOB) physical examination with provider- 11/[redacted] weeks gestation  Flu vaccine-[redacted] weeks gestation  Anatomy scan-[redacted] weeks gestation  Glucose tolerance test, blood work to test for anemia, T-dap vaccine-[redacted] weeks gestation  Vaginal swabs/cultures-STD/Group B strep-[redacted] weeks gestation  Appointments every 4 weeks until 28 weeks  Every 2 weeks from 28 weeks until 36 weeks  Weekly visits from 36 weeks until delivery  Breastfeeding  Choosing to breastfeed is one of the best decisions you can make for yourself and your baby. A change in hormones during pregnancy causes your breasts to make breast milk in your milk-producing glands. Hormones prevent breast milk from being released before your baby is born. They also prompt milk flow after birth. Once breastfeeding has begun, thoughts of your baby, as well as his or her sucking or crying, can stimulate the release of milk from your milk-producing glands. Benefits of breastfeeding Research shows that breastfeeding offers many health benefits for infants and mothers. It also offers a cost-free and convenient way to feed your baby. For your baby  Your first milk (colostrum) helps your baby's digestive system to function better.  Special cells in your milk (antibodies) help your baby to fight off infections.  Breastfed babies are less likely to develop asthma, allergies, obesity, or type 2 diabetes. They are also at lower risk for sudden infant death syndrome (SIDS).  Nutrients in breast milk are better able to meet your baby's needs compared to infant formula.  Breast milk improves your baby's brain development. For you  Breastfeeding helps to create a very special bond between you and your baby.  Breastfeeding is convenient. Breast milk costs  nothing and is always available at the correct temperature.  Breastfeeding helps to burn calories. It helps you to lose the weight that you gained during pregnancy.  Breastfeeding makes your uterus return faster to its size before pregnancy. It also slows bleeding (lochia) after you give birth.  Breastfeeding helps to lower your risk of developing type 2 diabetes, osteoporosis, rheumatoid arthritis, cardiovascular disease, and breast, ovarian, uterine, and endometrial cancer later in life. Breastfeeding basics Starting breastfeeding  Find a comfortable place to sit or lie down, with your neck and back well-supported.  Place a pillow or a rolled-up blanket under your baby to bring him or her to the level of your breast (if you are seated). Nursing pillows are specially designed to help support your arms and your baby while you breastfeed.  Make sure that your baby's tummy (abdomen) is facing your abdomen.  Gently massage your breast. With your fingertips, massage from the outer edges of your breast inward toward the nipple. This encourages milk flow. If your milk flows slowly, you may need to continue this action during the feeding.  Support your breast with 4 fingers underneath and your thumb above your nipple (make the letter "C" with your hand). Make sure your fingers are well away from your nipple and your baby's mouth.  Stroke your baby's lips gently with your finger or nipple.  When your baby's mouth is open wide enough, quickly bring your baby to your breast, placing your entire nipple and as much of the areola as possible into your baby's mouth. The areola is the colored area around your nipple. ? More areola should be visible above your  baby's upper lip than below the lower lip. ? Your baby's lips should be opened and extended outward (flanged) to ensure an adequate, comfortable latch. ? Your baby's tongue should be between his or her lower gum and your breast.  Make sure that your  baby's mouth is correctly positioned around your nipple (latched). Your baby's lips should create a seal on your breast and be turned out (everted).  It is common for your baby to suck about 2-3 minutes in order to start the flow of breast milk. Latching Teaching your baby how to latch onto your breast properly is very important. An improper latch can cause nipple pain, decreased milk supply, and poor weight gain in your baby. Also, if your baby is not latched onto your nipple properly, he or she may swallow some air during feeding. This can make your baby fussy. Burping your baby when you switch breasts during the feeding can help to get rid of the air. However, teaching your baby to latch on properly is still the best way to prevent fussiness from swallowing air while breastfeeding. Signs that your baby has successfully latched onto your nipple  Silent tugging or silent sucking, without causing you pain. Infant's lips should be extended outward (flanged).  Swallowing heard between every 3-4 sucks once your milk has started to flow (after your let-down milk reflex occurs).  Muscle movement above and in front of his or her ears while sucking. Signs that your baby has not successfully latched onto your nipple  Sucking sounds or smacking sounds from your baby while breastfeeding.  Nipple pain. If you think your baby has not latched on correctly, slip your finger into the corner of your baby's mouth to break the suction and place it between your baby's gums. Attempt to start breastfeeding again. Signs of successful breastfeeding Signs from your baby  Your baby will gradually decrease the number of sucks or will completely stop sucking.  Your baby will fall asleep.  Your baby's body will relax.  Your baby will retain a small amount of milk in his or her mouth.  Your baby will let go of your breast by himself or herself. Signs from you  Breasts that have increased in firmness, weight, and  size 1-3 hours after feeding.  Breasts that are softer immediately after breastfeeding.  Increased milk volume, as well as a change in milk consistency and color by the fifth day of breastfeeding.  Nipples that are not sore, cracked, or bleeding. Signs that your baby is getting enough milk  Wetting at least 1-2 diapers during the first 24 hours after birth.  Wetting at least 5-6 diapers every 24 hours for the first week after birth. The urine should be clear or pale yellow by the age of 5 days.  Wetting 6-8 diapers every 24 hours as your baby continues to grow and develop.  At least 3 stools in a 24-hour period by the age of 5 days. The stool should be soft and yellow.  At least 3 stools in a 24-hour period by the age of 7 days. The stool should be seedy and yellow.  No loss of weight greater than 10% of birth weight during the first 3 days of life.  Average weight gain of 4-7 oz (113-198 g) per week after the age of 4 days.  Consistent daily weight gain by the age of 5 days, without weight loss after the age of 2 weeks. After a feeding, your baby may spit up a  small amount of milk. This is normal. Breastfeeding frequency and duration Frequent feeding will help you make more milk and can prevent sore nipples and extremely full breasts (breast engorgement). Breastfeed when you feel the need to reduce the fullness of your breasts or when your baby shows signs of hunger. This is called "breastfeeding on demand." Signs that your baby is hungry include:  Increased alertness, activity, or restlessness.  Movement of the head from side to side.  Opening of the mouth when the corner of the mouth or cheek is stroked (rooting).  Increased sucking sounds, smacking lips, cooing, sighing, or squeaking.  Hand-to-mouth movements and sucking on fingers or hands.  Fussing or crying. Avoid introducing a pacifier to your baby in the first 4-6 weeks after your baby is born. After this time, you may  choose to use a pacifier. Research has shown that pacifier use during the first year of a baby's life decreases the risk of sudden infant death syndrome (SIDS). Allow your baby to feed on each breast as long as he or she wants. When your baby unlatches or falls asleep while feeding from the first breast, offer the second breast. Because newborns are often sleepy in the first few weeks of life, you may need to awaken your baby to get him or her to feed. Breastfeeding times will vary from baby to baby. However, the following rules can serve as a guide to help you make sure that your baby is properly fed:  Newborns (babies 43 weeks of age or younger) may breastfeed every 1-3 hours.  Newborns should not go without breastfeeding for longer than 3 hours during the day or 5 hours during the night.  You should breastfeed your baby a minimum of 8 times in a 24-hour period. Breast milk pumping     Pumping and storing breast milk allows you to make sure that your baby is exclusively fed your breast milk, even at times when you are unable to breastfeed. This is especially important if you go back to work while you are still breastfeeding, or if you are not able to be present during feedings. Your lactation consultant can help you find a method of pumping that works best for you and give you guidelines about how long it is safe to store breast milk. Caring for your breasts while you breastfeed Nipples can become dry, cracked, and sore while breastfeeding. The following recommendations can help keep your breasts moisturized and healthy:  Avoid using soap on your nipples.  Wear a supportive bra designed especially for nursing. Avoid wearing underwire-style bras or extremely tight bras (sports bras).  Air-dry your nipples for 3-4 minutes after each feeding.  Use only cotton bra pads to absorb leaked breast milk. Leaking of breast milk between feedings is normal.  Use lanolin on your nipples after  breastfeeding. Lanolin helps to maintain your skin's normal moisture barrier. Pure lanolin is not harmful (not toxic) to your baby. You may also hand express a few drops of breast milk and gently massage that milk into your nipples and allow the milk to air-dry. In the first few weeks after giving birth, some women experience breast engorgement. Engorgement can make your breasts feel heavy, warm, and tender to the touch. Engorgement peaks within 3-5 days after you give birth. The following recommendations can help to ease engorgement:  Completely empty your breasts while breastfeeding or pumping. You may want to start by applying warm, moist heat (in the shower or with warm, water-soaked  hand towels) just before feeding or pumping. This increases circulation and helps the milk flow. If your baby does not completely empty your breasts while breastfeeding, pump any extra milk after he or she is finished.  Apply ice packs to your breasts immediately after breastfeeding or pumping, unless this is too uncomfortable for you. To do this: ? Put ice in a plastic bag. ? Place a towel between your skin and the bag. ? Leave the ice on for 20 minutes, 2-3 times a day.  Make sure that your baby is latched on and positioned properly while breastfeeding. If engorgement persists after 48 hours of following these recommendations, contact your health care provider or a Advertising copywriterlactation consultant. Overall health care recommendations while breastfeeding  Eat 3 healthy meals and 3 snacks every day. Well-nourished mothers who are breastfeeding need an additional 450-500 calories a day. You can meet this requirement by increasing the amount of a balanced diet that you eat.  Drink enough water to keep your urine pale yellow or clear.  Rest often, relax, and continue to take your prenatal vitamins to prevent fatigue, stress, and low vitamin and mineral levels in your body (nutrient deficiencies).  Do not use any products that  contain nicotine or tobacco, such as cigarettes and e-cigarettes. Your baby may be harmed by chemicals from cigarettes that pass into breast milk and exposure to secondhand smoke. If you need help quitting, ask your health care provider.  Avoid alcohol.  Do not use illegal drugs or marijuana.  Talk with your health care provider before taking any medicines. These include over-the-counter and prescription medicines as well as vitamins and herbal supplements. Some medicines that may be harmful to your baby can pass through breast milk.  It is possible to become pregnant while breastfeeding. If birth control is desired, ask your health care provider about options that will be safe while breastfeeding your baby. Where to find more information: [employer]: www.llli.org Contact a health care provider if:  You feel like you want to stop breastfeeding or have become frustrated with breastfeeding.  Your nipples are cracked or bleeding.  Your breasts are red, tender, or warm.  You have: ? Painful breasts or nipples. ? A swollen area on either breast. ? A fever or chills. ? Nausea or vomiting. ? Drainage other than breast milk from your nipples.  Your breasts do not become full before feedings by the fifth day after you give birth.  You feel sad and depressed.  Your baby is: ? Too sleepy to eat well. ? Having trouble sleeping. ? More than 761 week old and wetting fewer than 6 diapers in a 24-hour period. ? Not gaining weight by 705 days of age.  Your baby has fewer than 3 stools in a 24-hour period.  Your baby's skin or the white parts of his or her eyes become yellow. Get help right away if:  Your baby is overly tired (lethargic) and does not want to wake up and feed.  Your baby develops an unexplained fever. Summary  Breastfeeding offers many health benefits for infant and mothers.  Try to breastfeed your infant when he or she shows early signs of hunger.   Gently tickle or stroke your baby's lips with your finger or nipple to allow the baby to open his or her mouth. Bring the baby to your breast. Make sure that much of the areola is in your baby's mouth. Offer one side and burp the baby before you offer  the other side.  Talk with your health care provider or lactation consultant if you have questions or you face problems as you breastfeed. This information is not intended to replace advice given to you by your health care provider. Make sure you discuss any questions you have with your health care provider. Document Released: 12/13/2005 Document Revised: 01/14/2017 Document Reviewed: 01/14/2017 Elsevier Interactive Patient Education  2019 [employer] Pain Relief During Labor and Delivery Many things can cause pain during labor and delivery, including:  Pressure on bones and ligaments due to the baby moving through the pelvis.  Stretching of tissues due to the baby moving through the birth canal.  Muscle tension due to anxiety or nervousness.  The uterus tightening (contracting) and relaxing to help move the baby. There are many ways to deal with the pain of labor and delivery. They include:  Taking prenatal classes. Taking these classes helps you know what to expect during your baby's birth. What you learn will increase your confidence and decrease your anxiety.  Practicing relaxation techniques or doing relaxing activities, such as: ? Focused breathing. ? Meditation. ? Visualization. ? Aroma therapy. ? Listening to your favorite music. ? Hypnosis.  Taking a warm shower or bath (hydrotherapy). This may: ? Provide comfort and relaxation. ? Lessen your perception of pain. ? Decrease the amount of pain medicine needed. ? Decrease the length of labor.  Getting a massage or counterpressure on your back.  Applying warm packs or ice packs.  Changing positions often, moving around, or using a birthing ball.  Getting: ? Pain medicine  through an IV or injection into a muscle. ? Pain medicine inserted into your spinal column. ? Injections of sterile water just under the skin on your lower back (intradermal injections). ? Laughing gas (nitrous oxide). Discuss your pain control options with your health care provider during your prenatal visits. Explore the options offered by your hospital or birth center. What kinds of medicine are available? There are two kinds of medicines that can be used to relieve pain during labor and delivery:  Analgesics. These medicines decrease pain without causing you to lose feeling or the ability to move your muscles.  Anesthetics. These medicines block feeling in the body and can decrease your ability to move freely. Both of these kinds of medicine can cause minor side effects, such as nausea, trouble concentrating, and sleepiness. They can also decrease the baby's heart rate before birth and affect the baby's breathing rate after birth. For this reason, health care providers are careful about when and how much medicine is given. What are specific medicines and procedures that provide pain relief? Local Anesthetics Local anesthetics are used to numb a small area of the body. They may be used along with another kind of anesthetic or used to numb the nerves of the vagina, cervix, and perineum during the second stage of labor. General Anesthetics General anesthetics cause you to lose consciousness so you do not feel pain. They are usually only used for an emergency cesarean delivery. General anesthetics are given through an IV tube and a mask. Pudendal Block A pudendal block is a form of local anesthetic. It may be used to relieve the pain associated with pushing or stretching of the perineum at the time of delivery or to further numb the perineum. A pudendal block is done by injecting numbing medicine through the vaginal wall into a nerve in the pelvis. Epidural Analgesia Epidural analgesia is given  through a flexible IV catheter  that is inserted into the lower back. Numbing medicine is delivered continuously to the area near your spinal column nerves (epidural space). After having this type of analgesia, you may be able to move your legs but you most likely will not be able to walk. Depending on the amount of medicine given, you may lose all feeling in the lower half of your body, or you may retain some level of sensation, including the urge to push. Epidural analgesia can be used to provide pain relief for a vaginal birth. Spinal Block A spinal block is similar to epidural analgesia, but the medicine is injected into the spinal fluid instead of the epidural space. A spinal block is only given once. It starts to relieve pain quickly, but the pain relief lasts only 1-6 hours. Spinal blocks can be used for cesarean deliveries. Combined Spinal-Epidural (CSE) Block A CSE block combines the effects of a spinal block and epidural analgesia. The spinal block works quickly to block all pain. The epidural analgesia provides continuous pain relief, even after the effects of the spinal block have worn off. This information is not intended to replace advice given to you by your health care provider. Make sure you discuss any questions you have with your health care provider. Document Released: 03/31/2009 Document Revised: 05/21/2016 Document Reviewed: 05/05/2016 Elsevier Interactive Patient Education  2019 [employer]. Third Trimester of Pregnancy  The third trimester is from week 28 through week 40 (months 7 through 9). This trimester is when your unborn baby (fetus) is growing very fast. At the end of the ninth month, the unborn baby is about 20 inches in length. It weighs about 6-10 pounds. Follow these instructions at home: Medicines  Take over-the-counter and prescription medicines only as told by your doctor. Some medicines are safe and some medicines are not safe during pregnancy.  Take a prenatal  vitamin that contains at least 600 micrograms (mcg) of folic acid.  If you have trouble pooping (constipation), take medicine that will make your stool soft (stool softener) if your doctor approves. Eating and drinking   Eat regular, healthy meals.  Avoid raw meat and uncooked cheese.  If you get low calcium from the food you eat, talk to your doctor about taking a daily calcium supplement.  Eat four or five small meals rather than three large meals a day.  Avoid foods that are high in fat and sugars, such as fried and sweet foods.  To prevent constipation: ? Eat foods that are high in fiber, like fresh fruits and vegetables, whole grains, and beans. ? Drink enough fluids to keep your pee (urine) clear or pale yellow. Activity  Exercise only as told by your doctor. Stop exercising if you start to have cramps.  Avoid heavy lifting, wear low heels, and sit up straight.  Do not exercise if it is too hot, too humid, or if you are in a place of great height (high altitude).  You may continue to have sex unless your doctor tells you not to. Relieving pain and discomfort  Wear a good support bra if your breasts are tender.  Take frequent breaks and rest with your legs raised if you have leg cramps or low back pain.  Take warm water baths (sitz baths) to soothe pain or discomfort caused by hemorrhoids. Use hemorrhoid cream if your doctor approves.  If you develop puffy, bulging veins (varicose veins) in your legs: ? Wear support hose or compression stockings as told by your doctor. ?  Raise (elevate) your feet for 15 minutes, 3-4 times a day. ? Limit salt in your food. Safety  Wear your seat belt when driving.  Make a list of emergency phone numbers, including numbers for family, friends, the hospital, and police and fire departments. Preparing for your baby's arrival To prepare for the arrival of your baby:  Take prenatal classes.  Practice driving to the hospital.  Visit  the hospital and tour the maternity area.  Talk to your work about taking leave once the baby comes.  Pack your hospital bag.  Prepare the baby's room.  Go to your doctor visits.  Buy a rear-facing car seat. Learn how to install it in your car. General instructions  Do not use hot tubs, steam rooms, or saunas.  Do not use any products that contain nicotine or tobacco, such as cigarettes and e-cigarettes. If you need help quitting, ask your doctor.  Do not drink alcohol.  Do not douche or use tampons or scented sanitary pads.  Do not cross your legs for long periods of time.  Do not travel for long distances unless you must. Only do so if your doctor says it is okay.  Visit your dentist if you have not gone during your pregnancy. Use a soft toothbrush to brush your teeth. Be gentle when you floss.  Avoid cat litter boxes and soil used by cats. These carry germs that can cause birth defects in the baby and can cause a loss of your baby (miscarriage) or stillbirth.  Keep all your prenatal visits as told by your doctor. This is important. Contact a doctor if:  You are not sure if you are in labor or if your water has broken.  You are dizzy.  You have mild cramps or pressure in your lower belly.  You have a nagging pain in your belly area.  You continue to feel sick to your stomach, you throw up, or you have watery poop.  You have bad smelling fluid coming from your vagina.  You have pain when you pee. Get help right away if:  You have a fever.  You are leaking fluid from your vagina.  You are spotting or bleeding from your vagina.  You have severe belly cramps or pain.  You lose or gain weight quickly.  You have trouble catching your breath and have chest pain.  You notice sudden or extreme puffiness (swelling) of your face, hands, ankles, feet, or legs.  You have not felt the baby move in over an hour.  You have severe headaches that do not go away with  medicine.  You have trouble seeing.  You are leaking, or you are having a gush of fluid, from your vagina before you are 37 weeks.  You have regular belly spasms (contractions) before you are 37 weeks. Summary  The third trimester is from week 28 through week 40 (months 7 through 9). This time is when your unborn baby is growing very fast.  Follow your doctor's advice about medicine, food, and activity.  Get ready for the arrival of your baby by taking prenatal classes, getting all the baby items ready, preparing the baby's room, and visiting your doctor to be checked.  Get help right away if you are bleeding from your vagina, or you have chest pain and trouble catching your breath, or if you have not felt your baby move in over an hour. This information is not intended to replace advice given to you by your health  care provider. Make sure you discuss any questions you have with your health care provider. Document Released: 03/09/2010 Document Revised: 01/18/2017 Document Reviewed: 01/18/2017 Elsevier Interactive Patient Education  2019 [employer].

## 2019-05-10 NOTE — Progress Notes (Signed)
ROB-Patient c/o "a lot of lower back pain and menstrual like cramping" x1 week.

## 2019-05-11 LAB — CERVICOVAGINAL ANCILLARY ONLY
Bacterial vaginitis: NEGATIVE
Candida vaginitis: POSITIVE — AB

## 2019-05-11 LAB — RPR: RPR Ser Ql: NONREACTIVE

## 2019-05-11 LAB — CBC
Hematocrit: 37 % (ref 34.0–46.6)
Hemoglobin: 12.3 g/dL (ref 11.1–15.9)
MCH: 27.7 pg (ref 26.6–33.0)
MCHC: 33.2 g/dL (ref 31.5–35.7)
MCV: 83 fL (ref 79–97)
Platelets: 394 10*3/uL (ref 150–450)
RBC: 4.44 x10E6/uL (ref 3.77–5.28)
RDW: 12.3 % (ref 11.7–15.4)
WBC: 9.6 10*3/uL (ref 3.4–10.8)

## 2019-05-11 LAB — GLUCOSE, 1 HOUR GESTATIONAL: Gestational Diabetes Screen: 135 mg/dL (ref 65–139)

## 2019-05-11 LAB — FETAL FIBRONECTIN: Fetal Fibronectin: NEGATIVE

## 2019-05-13 ENCOUNTER — Other Ambulatory Visit: Payer: Self-pay | Admitting: Certified Nurse Midwife

## 2019-05-13 DIAGNOSIS — B379 Candidiasis, unspecified: Secondary | ICD-10-CM

## 2019-05-13 MED ORDER — TERCONAZOLE 0.4 % VA CREA: 1.0000 | TOPICAL_CREAM | Freq: Every day | VAGINAL | 0 refills | Status: DC

## 2019-05-24 ENCOUNTER — Telehealth: Payer: Self-pay

## 2019-05-24 NOTE — Telephone Encounter (Signed)
Coronavirus (COVID-19) Are you at risk?  Are you at risk for the Coronavirus (COVID-19)?  To be considered HIGH RISK for Coronavirus (COVID-19), you have to meet the following criteria:  . Traveled to [place], [place], [place], [place] or [place]; or in the [place] to [place], [place], [place], or [place]; and have fever, cough, and shortness of breath within the last 2 weeks of travel OR . Been in close contact with a person diagnosed with COVID-19 within the last 2 weeks and have fever, cough, and shortness of breath . IF YOU DO NOT MEET THESE CRITERIA, YOU ARE CONSIDERED LOW RISK FOR COVID-19.  What to do if you are HIGH RISK for COVID-19?  . If you are having a medical emergency, call 911. . Seek medical care right away. Before you go to a doctor's office, urgent care or emergency department, call ahead and tell them about your recent travel, contact with someone diagnosed with COVID-19, and your symptoms. You should receive instructions from your physician's office regarding next steps of care.  . When you arrive at healthcare provider, tell the healthcare staff immediately you have returned from visiting [place], [place], [place], [place] or [place]; or traveled in the [place] to [place], [place], [place], or [place]; in the last two weeks or you have been in close contact with a person diagnosed with COVID-19 in the last 2 weeks.   . Tell the health care staff about your symptoms: fever, cough and shortness of breath. . After you have been seen by a medical provider, you will be either: o Tested for (COVID-19) and discharged home on quarantine except to seek medical care if symptoms worsen, and asked to  - Stay home and avoid contact with others until you get your results (4-5 days)  - Avoid travel on public transportation if possible (such as bus, train, or airplane) or o Sent to the Emergency Department by EMS for evaluation, COVID-19 testing, and possible  admission depending on your condition and test results.  What to do if you are LOW RISK for COVID-19?  Reduce your risk of any infection by using the same precautions used for avoiding the common cold or flu:  . Wash your hands often with soap and warm water for at least 20 seconds.  If soap and water are not readily available, use an alcohol-based hand sanitizer with at least 60% alcohol.  . If coughing or sneezing, cover your mouth and nose by coughing or sneezing into the elbow areas of your shirt or coat, into a tissue or into your sleeve (not your hands). . Avoid shaking hands with others and consider head nods or verbal greetings only. . Avoid touching your eyes, nose, or mouth with unwashed hands.  . Avoid close contact with people who are sick. . Avoid places or events with large numbers of people in one location, like concerts or sporting events. . Carefully consider travel plans you have or are making. . If you are planning any travel outside or inside the [place], visit the CDC's Travelers' Health webpage for the latest health notices. . If you have some symptoms but not all symptoms, continue to monitor at home and seek medical attention if your symptoms worsen. . If you are having a medical emergency, call 911.   ADDITIONAL HEALTHCARE OPTIONS FOR PATIENTS  [facility] / e-Visit: [email or web address]         [facility]: [phone]  [facility]: [phone]                   [facility]: [phone]   Pre-screen negative, DM.   

## 2019-05-24 NOTE — Telephone Encounter (Signed)
Coronavirus (COVID-19) Are you at risk?  Are you at risk for the Coronavirus (COVID-19)?  To be considered HIGH RISK for Coronavirus (COVID-19), you have to meet the following criteria:  . Traveled to [place], [place], [place], [place] or [place]; or in the [place] to [place], [place], [place], or [place]; and have fever, cough, and shortness of breath within the last 2 weeks of travel OR . Been in close contact with a person diagnosed with COVID-19 within the last 2 weeks and have fever, cough, and shortness of breath . IF YOU DO NOT MEET THESE CRITERIA, YOU ARE CONSIDERED LOW RISK FOR COVID-19.  What to do if you are HIGH RISK for COVID-19?  . If you are having a medical emergency, call 911. . Seek medical care right away. Before you go to a doctor's office, urgent care or emergency department, call ahead and tell them about your recent travel, contact with someone diagnosed with COVID-19, and your symptoms. You should receive instructions from your physician's office regarding next steps of care.  . When you arrive at healthcare provider, tell the healthcare staff immediately you have returned from visiting [place], [place], [place], [place] or [place]; or traveled in the [place] to [place], [place], [place], or [place]; in the last two weeks or you have been in close contact with a person diagnosed with COVID-19 in the last 2 weeks.   . Tell the health care staff about your symptoms: fever, cough and shortness of breath. . After you have been seen by a medical provider, you will be either: o Tested for (COVID-19) and discharged home on quarantine except to seek medical care if symptoms worsen, and asked to  - Stay home and avoid contact with others until you get your results (4-5 days)  - Avoid travel on public transportation if possible (such as bus, train, or airplane) or o Sent to the Emergency Department by EMS for evaluation, COVID-19 testing, and possible  admission depending on your condition and test results.  What to do if you are LOW RISK for COVID-19?  Reduce your risk of any infection by using the same precautions used for avoiding the common cold or flu:  . Wash your hands often with soap and warm water for at least 20 seconds.  If soap and water are not readily available, use an alcohol-based hand sanitizer with at least 60% alcohol.  . If coughing or sneezing, cover your mouth and nose by coughing or sneezing into the elbow areas of your shirt or coat, into a tissue or into your sleeve (not your hands). . Avoid shaking hands with others and consider head nods or verbal greetings only. . Avoid touching your eyes, nose, or mouth with unwashed hands.  . Avoid close contact with people who are [place]. . Avoid places or events with large numbers of people in one location, like concerts or sporting events. . Carefully consider travel plans you have or are making. . If you are planning any travel outside or inside the [place], visit the CDC's Travelers' Health webpage for the latest health notices. . If you have some symptoms but not all symptoms, continue to monitor at home and seek medical attention if your symptoms worsen. . If you are having a medical emergency, call 911.  05/24/19 SCREENING NEG [facility] ADDITIONAL HEALTHCARE OPTIONS FOR PATIENTS  [facility] / e-Visit: [email or web address]         [facility]: [phone]    [facility]: [phone]                   [facility]: [phone]  

## 2019-05-25 ENCOUNTER — Other Ambulatory Visit: Payer: Self-pay

## 2019-05-25 ENCOUNTER — Encounter: Payer: Self-pay | Admitting: Certified Nurse Midwife

## 2019-05-25 ENCOUNTER — Ambulatory Visit (INDEPENDENT_AMBULATORY_CARE_PROVIDER_SITE_OTHER): Payer: Managed Care, Other (non HMO) | Admitting: Certified Nurse Midwife

## 2019-05-25 VITALS — BP 87/54 | HR 90 | Wt 218.2 lb

## 2019-05-25 DIAGNOSIS — Z3493 Encounter for supervision of normal pregnancy, unspecified, third trimester: Secondary | ICD-10-CM

## 2019-05-25 LAB — POCT URINALYSIS DIPSTICK OB
Bilirubin, UA: NEGATIVE
Blood, UA: NEGATIVE
Glucose, UA: NEGATIVE
Ketones, UA: NEGATIVE
Leukocytes, UA: NEGATIVE
Nitrite, UA: NEGATIVE
POC,PROTEIN,UA: NEGATIVE
Spec Grav, UA: 1.025 (ref 1.010–1.025)
Urobilinogen, UA: 0.2 E.U./dL
pH, UA: 5 (ref 5.0–8.0)

## 2019-05-25 NOTE — Progress Notes (Signed)
ROB doing well. Pt states she had some back pain last night that kept her awake she got nervous and was tearful but knew she was coming in this morning. Adomen palpates soft. Discussed PTL precautions and ice/heat , tylenol for back pain. She verbalizes and agrees to plan . Follow up 2 wks  Doreene Burke, CNM

## 2019-05-25 NOTE — Patient Instructions (Signed)
[facility]  [facility]  [address], [place], [place] [address]  Phone: [phone]  [facility] (second location)  [address] [place], [place] [address]  Phone: [phone]  [facility] ([place]) [address], [place], [place] [address] Phone: [phone]  [facility]  [address] , [place] [address]  Phone: [phone] 

## 2019-06-01 DIAGNOSIS — Z0289 Encounter for other administrative examinations: Secondary | ICD-10-CM

## 2019-06-06 ENCOUNTER — Telehealth: Payer: Self-pay | Admitting: *Deleted

## 2019-06-06 NOTE — Telephone Encounter (Signed)
Coronavirus (COVID-19) Are you at risk?  Are you at risk for the Coronavirus (COVID-19)?  To be considered HIGH RISK for Coronavirus (COVID-19), you have to meet the following criteria:  . Traveled to [place], [place], [place], [place] or [place]; or in the [place] to [place], [place], [place], or [place]; and have fever, cough, and shortness of breath within the last 2 weeks of travel OR . Been in close contact with a person diagnosed with COVID-19 within the last 2 weeks and have fever, cough, and shortness of breath . IF YOU DO NOT MEET THESE CRITERIA, YOU ARE CONSIDERED LOW RISK FOR COVID-19.  What to do if you are HIGH RISK for COVID-19?  . If you are having a medical emergency, call 911. . Seek medical care right away. Before you go to a doctor's office, urgent care or emergency department, call ahead and tell them about your recent travel, contact with someone diagnosed with COVID-19, and your symptoms. You should receive instructions from your physician's office regarding next steps of care.  . When you arrive at healthcare provider, tell the healthcare staff immediately you have returned from visiting [place], [place], [place], [place] or [place]; or traveled in the [place] to [place], [place], [place], or [place]; in the last two weeks or you have been in close contact with a person diagnosed with COVID-19 in the last 2 weeks.   . Tell the health care staff about your symptoms: fever, cough and shortness of breath. . After you have been seen by a medical provider, you will be either: o Tested for (COVID-19) and discharged home on quarantine except to seek medical care if symptoms worsen, and asked to  - Stay home and avoid contact with others until you get your results (4-5 days)  - Avoid travel on public transportation if possible (such as bus, train, or airplane) or o Sent to the Emergency Department by EMS for evaluation, COVID-19 testing, and possible  admission depending on your condition and test results.  What to do if you are LOW RISK for COVID-19?  Reduce your risk of any infection by using the same precautions used for avoiding the common cold or flu:  . Wash your hands often with soap and warm water for at least 20 seconds.  If soap and water are not readily available, use an alcohol-based hand sanitizer with at least 60% alcohol.  . If coughing or sneezing, cover your mouth and nose by coughing or sneezing into the elbow areas of your shirt or coat, into a tissue or into your sleeve (not your hands). . Avoid shaking hands with others and consider head nods or verbal greetings only. . Avoid touching your eyes, nose, or mouth with unwashed hands.  . Avoid close contact with people who are sick. . Avoid places or events with large numbers of people in one location, like concerts or sporting events. . Carefully consider travel plans you have or are making. . If you are planning any travel outside or inside the [place], visit the CDC's Travelers' Health webpage for the latest health notices. . If you have some symptoms but not all symptoms, continue to monitor at home and seek medical attention if your symptoms worsen. . If you are having a medical emergency, call 911.   ADDITIONAL HEALTHCARE OPTIONS FOR PATIENTS  [facility] / e-Visit: [email or web address]         [facility]: [phone]  [facility]: [phone]                   [facility]: [phone]   Spoke with pt denies any sx.  Amy Clontz, CMA 

## 2019-06-07 ENCOUNTER — Ambulatory Visit (INDEPENDENT_AMBULATORY_CARE_PROVIDER_SITE_OTHER): Payer: Managed Care, Other (non HMO) | Admitting: Obstetrics and Gynecology

## 2019-06-07 ENCOUNTER — Other Ambulatory Visit: Payer: Self-pay

## 2019-06-07 VITALS — BP 104/62 | HR 85 | Wt 219.2 lb

## 2019-06-07 DIAGNOSIS — Z3493 Encounter for supervision of normal pregnancy, unspecified, third trimester: Secondary | ICD-10-CM

## 2019-06-07 LAB — POCT URINALYSIS DIPSTICK OB
Bilirubin, UA: NEGATIVE
Blood, UA: NEGATIVE
Glucose, UA: NEGATIVE
Ketones, UA: NEGATIVE
Leukocytes, UA: NEGATIVE
Nitrite, UA: NEGATIVE
POC,PROTEIN,UA: NEGATIVE
Spec Grav, UA: 1.015 (ref 1.010–1.025)
Urobilinogen, UA: 0.2 E.U./dL
pH, UA: 6.5 (ref 5.0–8.0)

## 2019-06-07 NOTE — Progress Notes (Signed)
ROB- doing well, will do growth scan at next visit.  

## 2019-06-07 NOTE — Progress Notes (Signed)
ROB- pt is doing well 

## 2019-06-12 ENCOUNTER — Other Ambulatory Visit (HOSPITAL_COMMUNITY)
Admission: RE | Admit: 2019-06-12 | Discharge: 2019-06-12 | Disposition: A | Discharge status: Home / Self Care | Payer: Managed Care, Other (non HMO) | Source: Ambulatory Visit | Attending: Certified Nurse Midwife | Admitting: Certified Nurse Midwife

## 2019-06-12 ENCOUNTER — Telehealth: Payer: Self-pay | Admitting: Certified Nurse Midwife

## 2019-06-12 ENCOUNTER — Telehealth: Payer: Self-pay

## 2019-06-12 ENCOUNTER — Ambulatory Visit (INDEPENDENT_AMBULATORY_CARE_PROVIDER_SITE_OTHER): Payer: Managed Care, Other (non HMO) | Admitting: Certified Nurse Midwife

## 2019-06-12 ENCOUNTER — Other Ambulatory Visit: Payer: Self-pay

## 2019-06-12 VITALS — BP 100/67 | HR 82 | Wt 220.1 lb

## 2019-06-12 DIAGNOSIS — N898 Other specified noninflammatory disorders of vagina: Secondary | ICD-10-CM | POA: Diagnosis not present

## 2019-06-12 DIAGNOSIS — R102 Pelvic and perineal pain: Secondary | ICD-10-CM | POA: Diagnosis not present

## 2019-06-12 DIAGNOSIS — M549 Dorsalgia, unspecified: Secondary | ICD-10-CM

## 2019-06-12 DIAGNOSIS — O99891 Other specified diseases and conditions complicating pregnancy: Secondary | ICD-10-CM

## 2019-06-12 DIAGNOSIS — Z3493 Encounter for supervision of normal pregnancy, unspecified, third trimester: Secondary | ICD-10-CM

## 2019-06-12 DIAGNOSIS — O26893 Other specified pregnancy related conditions, third trimester: Secondary | ICD-10-CM

## 2019-06-12 LAB — POCT URINALYSIS DIPSTICK OB
Bilirubin, UA: NEGATIVE
Blood, UA: NEGATIVE
Glucose, UA: NEGATIVE
Ketones, UA: NEGATIVE
Leukocytes, UA: NEGATIVE
Nitrite, UA: NEGATIVE
POC,PROTEIN,UA: NEGATIVE
Spec Grav, UA: 1.01 (ref 1.010–1.025)
Urobilinogen, UA: 0.2 E.U./dL
pH, UA: 6.5 (ref 5.0–8.0)

## 2019-06-12 MED ORDER — CYCLOBENZAPRINE HCL 10 MG PO TABS: 10.0000 mg | ORAL_TABLET | Freq: Three times a day (TID) | ORAL | 0 refills | Status: DC | PRN

## 2019-06-12 NOTE — Telephone Encounter (Signed)
Coronavirus (COVID-19) Are you at risk?  Are you at risk for the Coronavirus (COVID-19)?  To be considered HIGH RISK for Coronavirus (COVID-19), you have to meet the following criteria:  . Traveled to [place], [place], [place], [place] or [place]; or in the [place] to [place], [place], [place], or [place]; and have fever, cough, and shortness of breath within the last 2 weeks of travel OR . Been in close contact with a person diagnosed with COVID-19 within the last 2 weeks and have fever, cough, and shortness of breath . IF YOU DO NOT MEET THESE CRITERIA, YOU ARE CONSIDERED LOW RISK FOR COVID-19.  What to do if you are HIGH RISK for COVID-19?  . If you are having a medical emergency, call 911. . Seek medical care right away. Before you go to a doctor's office, urgent care or emergency department, call ahead and tell them about your recent travel, contact with someone diagnosed with COVID-19, and your symptoms. You should receive instructions from your physician's office regarding next steps of care.  . When you arrive at healthcare provider, tell the healthcare staff immediately you have returned from visiting [place], [place], [place], [place] or [place]; or traveled in the [place] to [place], [place], [place], or [place]; in the last two weeks or you have been in close contact with a person diagnosed with COVID-19 in the last 2 weeks.   . Tell the health care staff about your symptoms: fever, cough and shortness of breath. . After you have been seen by a medical provider, you will be either: o Tested for (COVID-19) and discharged home on quarantine except to seek medical care if symptoms worsen, and asked to  - Stay home and avoid contact with others until you get your results (4-5 days)  - Avoid travel on public transportation if possible (such as bus, train, or airplane) or o Sent to the Emergency Department by EMS for evaluation, COVID-19 testing, and possible  admission depending on your condition and test results.  What to do if you are LOW RISK for COVID-19?  Reduce your risk of any infection by using the same precautions used for avoiding the common cold or flu:  . Wash your hands often with soap and warm water for at least 20 seconds.  If soap and water are not readily available, use an alcohol-based hand sanitizer with at least 60% alcohol.  . If coughing or sneezing, cover your mouth and nose by coughing or sneezing into the elbow areas of your shirt or coat, into a tissue or into your sleeve (not your hands). . Avoid shaking hands with others and consider head nods or verbal greetings only. . Avoid touching your eyes, nose, or mouth with unwashed hands.  . Avoid close contact with people who are sick. . Avoid places or events with large numbers of people in one location, like concerts or sporting events. . Carefully consider travel plans you have or are making. . If you are planning any travel outside or inside the [place], visit the CDC's Travelers' Health webpage for the latest health notices. . If you have some symptoms but not all symptoms, continue to monitor at home and seek medical attention if your symptoms worsen. . If you are having a medical emergency, call 911.   ADDITIONAL HEALTHCARE OPTIONS FOR PATIENTS  [facility] / e-Visit: [email or web address]         [facility]: [phone]  [facility]: [facility]: [phone]   Pre-screen negative, DM.  Patient states she is having "normal pregnancy nausea".

## 2019-06-12 NOTE — Telephone Encounter (Signed)
Spoke with patient, she c/o pelvic pain and pressure all day.  No VB, no ROM, +FM.  Patient took Tylenol and has been resting all day with no relief.  Patient will come in office to be evaluated.  Patient verbalized understanding.

## 2019-06-12 NOTE — Progress Notes (Signed)
Patient c/o pelvic and lower back pain and pressure since this morning, c/o vaginal swelling, nausea, difficulty when walking.  Patient took Tylenol and rested with no relief.

## 2019-06-12 NOTE — Telephone Encounter (Signed)
The patient called and stated that she is experiencing pelvic pain and pressure, Pain in her legs that prohibit her from standing and nausea. Please advise.

## 2019-06-12 NOTE — Patient Instructions (Addendum)
Cyclobenzaprine tablets What is this medicine? CYCLOBENZAPRINE (sye kloe BEN za preen) is a muscle relaxer. It is used to treat muscle pain, spasms, and stiffness. This medicine may be used for other purposes; ask your health care provider or pharmacist if you have questions. COMMON BRAND NAME(S): Fexmid, Flexeril What should I tell my health care provider before I take this medicine? They need to know if you have any of these conditions: -heart disease, irregular heartbeat, or previous heart attack -liver disease -thyroid problem -an unusual or allergic reaction to cyclobenzaprine, tricyclic antidepressants, lactose, other medicines, foods, dyes, or preservatives -pregnant or trying to get pregnant -breast-feeding How should I use this medicine? Take this medicine by mouth with a glass of water. Follow the directions on the prescription label. If this medicine upsets your stomach, take it with food or milk. Take your medicine at regular intervals. Do not take it more often than directed. Talk to your pediatrician regarding the use of this medicine in children. Special care may be needed. Overdosage: If you think you have taken too much of this medicine contact a poison control center or emergency room at once. NOTE: This medicine is only for you. Do not share this medicine with others. What if I miss a dose? If you miss a dose, take it as soon as you can. If it is almost time for your next dose, take only that dose. Do not take double or extra doses. What may interact with this medicine? Do not take this medicine with any of the following medications: -MAOIs like Carbex, Eldepryl, Marplan, Nardil, and Parnate This medicine may also interact with the following medications: -alcohol -antihistamines for allergy, cough, and cold -certain medicines for anxiety or sleep -certain medicines for depression like amitriptyline, fluoxetine, sertraline -certain medicines for seizures like  phenobarbital, primidone -contrast dyes -local anesthetics like lidocaine, pramoxine, tetracaine -medicines that relax muscles for surgery -narcotic medicines for pain -phenothiazines like chlorpromazine, mesoridazine, prochlorperazine This list may not describe all possible interactions. Give your health care provider a list of all the medicines, herbs, non-prescription drugs, or dietary supplements you use. Also tell them if you smoke, drink alcohol, or use illegal drugs. Some items may interact with your medicine. What should I watch for while using this medicine? Tell your doctor or health care professional if your symptoms do not start to get better or if they get worse. You may get drowsy or dizzy. Do not drive, use machinery, or do anything that needs mental alertness until you know how this medicine affects you. Do not stand or sit up quickly, especially if you are an older patient. This reduces the risk of dizzy or fainting spells. Alcohol may interfere with the effect of this medicine. Avoid alcoholic drinks. If you are taking another medicine that also causes drowsiness, you may have more side effects. Give your health care provider a list of all medicines you use. Your doctor will tell you how much medicine to take. Do not take more medicine than directed. Call emergency for help if you have problems breathing or unusual sleepiness. Your mouth may get dry. Chewing sugarless gum or sucking hard candy, and drinking plenty of water may help. Contact your doctor if the problem does not go away or is severe. What side effects may I notice from receiving this medicine? Side effects that you should report to your doctor or health care professional as soon as possible: -allergic reactions like skin rash, itching or hives, swelling of the face,  lips, or tongue -breathing problems -chest pain -fast, irregular heartbeat -hallucinations -seizures -unusually weak or tired Side effects that  usually do not require medical attention (report to your doctor or health care professional if they continue or are bothersome): -headache -nausea, vomiting This list may not describe all possible side effects. Call your doctor for medical advice about side effects. You may report side effects to FDA at 1-800-FDA-1088. Where should I keep my medicine? Keep out of the reach of children. Store at room temperature between 15 and 30 degrees C (59 and 86 degrees F). Keep container tightly closed. Throw away any unused medicine after the expiration date. NOTE: This sheet is a summary. It may not cover all possible information. If you have questions about this medicine, talk to your doctor, pharmacist, or health care provider.  2019 Elsevier/Gold Standard (2017-10-05 13:04:35) Back Pain in Pregnancy Back pain during pregnancy is common. Back pain may be caused by several factors that are related to changes during your pregnancy. Follow these instructions at home: Managing pain, stiffness, and swelling      If directed, for sudden (acute) back pain, put ice on the painful area. ? Put ice in a plastic bag. ? Place a towel between your skin and the bag. ? Leave the ice on for 20 minutes, 2-3 times per day.  If directed, apply heat to the affected area before you exercise. Use the heat source that your health care provider recommends, such as a moist heat pack or a heating pad. ? Place a towel between your skin and the heat source. ? Leave the heat on for 20-30 minutes. ? Remove the heat if your skin turns bright red. This is especially important if you are unable to feel pain, heat, or cold. You may have a greater risk of getting burned.  If directed, massage the affected area. Activity  Exercise as told by your health care provider. Gentle exercise is the best way to prevent or manage back pain.  Listen to your body when lifting. If lifting hurts, ask for help or bend your knees. This uses  your leg muscles instead of your back muscles.  Squat down when picking up something from the floor. Do not bend over.  Only use bed rest for short periods as told by your health care provider. Bed rest should only be used for the most severe episodes of back pain. Standing, sitting, and lying down  Do not stand in one place for long periods of time.  Use good posture when sitting. Make sure your head rests over your shoulders and is not hanging forward. Use a pillow on your lower back if necessary.  Try sleeping on your side, preferably the left side, with a pregnancy support pillow or 1-2 regular pillows between your legs. ? If you have back pain after a night's rest, your bed may be too soft. ? A firm mattress may provide more support for your back during pregnancy. General instructions  Do not wear high heels.  Eat a healthy diet. Try to gain weight within your health care provider's recommendations.  Use a maternity girdle, elastic sling, or back brace as told by your health care provider.  Take over-the-counter and prescription medicines only as told by your health care provider.  Work with a physical therapist or massage therapist to find ways to manage back pain. Acupuncture or massage therapy may be helpful.  Keep all follow-up visits as told by your health care provider. This is  important. Contact a health care provider if:  Your back pain interferes with your daily activities.  You have increasing pain in other parts of your body. Get help right away if:  You develop numbness, tingling, weakness, or problems with the use of your arms or legs.  You develop severe back pain that is not controlled with medicine.  You have a change in bowel or bladder control.  You develop shortness of breath, dizziness, or you faint.  You develop nausea, vomiting, or sweating.  You have back pain that is a rhythmic, cramping pain similar to labor pains. Labor pain is usually 1-2  minutes apart, lasts for about 1 minute, and involves a bearing down feeling or pressure in your pelvis.  You have back pain and your water breaks or you have vaginal bleeding.  You have back pain or numbness that travels down your leg.  Your back pain developed after you fell.  You develop pain on one side of your back.  You see blood in your urine.  You develop skin blisters in the area of your back pain. Summary  Back pain may be caused by several factors that are related to changes during your pregnancy.  Follow instructions as told by your health care provider for managing pain, stiffness, and swelling.  Exercise as told by your health care provider. Gentle exercise is the best way to prevent or manage back pain.  Take over-the-counter and prescription medicines only as told by your health care provider.  Keep all follow-up visits as told by your health care provider. This is important. This information is not intended to replace advice given to you by your health care provider. Make sure you discuss any questions you have with your health care provider. Document Released: 03/23/2006 Document Revised: 05/31/2018 Document Reviewed: 05/31/2018 Elsevier Interactive Patient Education  2019 [employer]

## 2019-06-12 NOTE — Progress Notes (Signed)
Subjective:   Shannon [name] is a 28 y.o. G3P1011 [redacted]w[redacted]d being seen today for work in problem obstetrical visit.  Patient reports increased pelvic and back pain since 0930 this morning. Working from home today. Not wearing abdominal support. Notes intercourse last evening. Last dose of 1000 mg PO Tylenol at approximately noon.     Denies contractions, vaginal bleeding or leaking of fluid.  Reports good fetal movement.  The following portions of the patient's history were reviewed and updated as appropriate: allergies, current medications, past family history, past medical history, past social history, past surgical history and problem list.   Objective:   BP 100/67   Pulse 82   Wt 220 lb 1.6 oz (99.8 kg)   LMP 10/20/2018 (Exact Date)   BMI 38.26 kg/m   FHT: Fetal Heart Rate (bpm): 154  Uterine Size: Fundal Height: 36 cm  Fetal Movement: Movement: Present  Presentation: Presentation: Vertex    Abdomen:  soft, gravid, appropriate for gestational age,non-tender  Vaginal:  Discharge, white and yellow    Cervix: Closed,Long,-3, firm   Results for orders placed or performed in visit on 06/12/19 (from the past 24 hour(s))  POC Urinalysis Dipstick OB     Status: None   Collection Time: 06/12/19  4:12 PM  Result Value Ref Range   Color, UA Yellow    Clarity, UA Clear    Glucose, UA Negative Negative   Bilirubin, UA Negative    Ketones, UA Negative    Spec Grav, UA 1.010 1.010 - 1.025   Blood, UA Negative    pH, UA 6.5 5.0 - 8.0   POC,PROTEIN,UA Negative Negative, Trace, Small (1+), Moderate (2+), Large (3+), 4+   Urobilinogen, UA 0.2 0.2 or 1.0 E.U./dL   Nitrite, UA Negative    Leukocytes, UA Negative Negative   Appearance     Odor      Assessment:   Pregnancy:  G3P1011 at [redacted]w[redacted]d  1. Third trimester pregnancy  - POC Urinalysis Dipstick OB  2. Back pain affecting pregnancy in third trimester   3. Pelvic pain affecting pregnancy in third trimester, antepartum   Plan:    Vaginal swab collected. Will contact patient with results.   Preterm labor symptoms: vaginal bleeding, contractions and leaking of fluid reviewed in detail.  Fetal movement precautions reviewed.  Discussed home treatment measures. Rx: Flexeril, see orders.   Follow up in 2 weeks for growth ultrasound and ROB or sooner if needed.   Diona Fanti, CNM [facility], [facility] 06/12/19 5:38 PM

## 2019-06-14 ENCOUNTER — Encounter: Payer: Self-pay | Admitting: Certified Nurse Midwife

## 2019-06-14 LAB — CERVICOVAGINAL ANCILLARY ONLY
Bacterial vaginitis: NEGATIVE
Candida vaginitis: NEGATIVE

## 2019-06-26 ENCOUNTER — Ambulatory Visit: Payer: Managed Care, Other (non HMO) | Admitting: Adult Health

## 2019-06-26 ENCOUNTER — Encounter: Payer: Self-pay | Admitting: Adult Health

## 2019-06-26 ENCOUNTER — Other Ambulatory Visit: Payer: Self-pay

## 2019-06-26 VITALS — BP 108/60 | HR 98 | Temp 97.9°F | Resp 18 | Ht 63.0 in | Wt 223.0 lb

## 2019-06-26 DIAGNOSIS — Z0189 Encounter for other specified special examinations: Secondary | ICD-10-CM

## 2019-06-26 DIAGNOSIS — Z008 Encounter for other general examination: Secondary | ICD-10-CM

## 2019-06-26 NOTE — Patient Instructions (Signed)
Provider also recommends patient see primary care physician for a routine physical and to establish primary care. Patient may chose provider of choice. Also gave the [place]  PHYSICIAN REFERRAL LINE at [phone] or web site at [email or web address] to help assist with finding a primary care doctor. Patient understands this office is acute care office only and no primary care is done at this office.  The Biometric exam is a brief physical with labs including glucose and cholesterol. This does not replace a full physical with a primary care provider, and additional recommended labs. This is an acute care clinic not for maintenance of chronic or long standing conditions.   Provider also recommends if you do not have a primary care provider for patient to establish care promptly.You can choose any provider of your choice at any facility of your choice, the below information is  just a resource to aid in you finding a primary care provider for routine health maintenance.   [place]  PHYSICIAN/PROVIDER  REFERRAL LINE at [phone]  [email or web address] to help assist with finding a primary care doctor.   Helpful resources below of other primary care office's accepting new patients.    [facility]         [address]  [place] [address] [phone]   [facility]    [address], [address] [place], [place] [address] [phone]   [facility] [address]  [place], [place]  [phone]    [facility]  [address], [address] [place] [address] [phone]    Follow up with primary care as needed for chronic and maintenance health care- can be seen in this employee clinic for acute care.     Third Trimester of Pregnancy  The third trimester is from week 28 through week 40 (months 7 through 9). This trimester is when your unborn baby (fetus) is  growing very fast. At the end of the ninth month, the unborn baby is about 20 inches in length. It weighs about 6-10 pounds. Follow these instructions at home: Medicines  Take over-the-counter and prescription medicines only as told by your doctor. Some medicines are safe and some medicines are not safe during pregnancy.  Take a prenatal vitamin that contains at least 600 micrograms (mcg) of folic acid.  If you have trouble pooping (constipation), take medicine that will make your stool soft (stool softener) if your doctor approves. Eating and drinking   Eat regular, healthy meals.  Avoid raw meat and uncooked cheese.  If you get low calcium from the food you eat, talk to your doctor about taking a daily calcium supplement.  Eat four or five small meals rather than three large meals a day.  Avoid foods that are high in fat and sugars, such as fried and sweet foods.  To prevent constipation: ? Eat foods that are high in fiber, like fresh fruits and vegetables, whole grains, and beans. ? Drink enough fluids to keep your pee (urine) clear or pale yellow. Activity  Exercise only as told by your doctor. Stop exercising if you start to have cramps.  Avoid heavy lifting, wear low heels, and sit up straight.  Do not exercise if it is too hot, too humid, or if you are in a place of great height (high altitude).  You may continue to have sex unless your doctor tells you not to. Relieving  pain and discomfort  Wear a good support bra if your breasts are tender.  Take frequent breaks and rest with your legs raised if you have leg cramps or low back pain.  Take warm water baths (sitz baths) to soothe pain or discomfort caused by hemorrhoids. Use hemorrhoid cream if your doctor approves.  If you develop puffy, bulging veins (varicose veins) in your legs: ? Wear support hose or compression stockings as told by your doctor. ? Raise (elevate) your feet for 15 minutes, 3-4 times a  day. ? Limit salt in your food. Safety  Wear your seat belt when driving.  Make a list of emergency phone numbers, including numbers for family, friends, the hospital, and police and fire departments. Preparing for your baby's arrival To prepare for the arrival of your baby:  Take prenatal classes.  Practice driving to the hospital.  Visit the hospital and tour the maternity area.  Talk to your work about taking leave once the baby comes.  Pack your hospital bag.  Prepare the baby's room.  Go to your doctor visits.  Buy a rear-facing car seat. Learn how to install it in your car. General instructions  Do not use hot tubs, steam rooms, or saunas.  Do not use any products that contain nicotine or tobacco, such as cigarettes and e-cigarettes. If you need help quitting, ask your doctor.  Do not drink alcohol.  Do not douche or use tampons or scented sanitary pads.  Do not cross your legs for long periods of time.  Do not travel for long distances unless you must. Only do so if your doctor says it is okay.  Visit your dentist if you have not gone during your pregnancy. Use a soft toothbrush to brush your teeth. Be gentle when you floss.  Avoid cat litter boxes and soil used by cats. These carry germs that can cause birth defects in the baby and can cause a loss of your baby (miscarriage) or stillbirth.  Keep all your prenatal visits as told by your doctor. This is important. Contact a doctor if:  You are not sure if you are in labor or if your water has broken.  You are dizzy.  You have mild cramps or pressure in your lower belly.  You have a nagging pain in your belly area.  You continue to feel sick to your stomach, you throw up, or you have watery poop.  You have bad smelling fluid coming from your vagina.  You have pain when you pee. Get help right away if:  You have a fever.  You are leaking fluid from your vagina.  You are spotting or bleeding from  your vagina.  You have severe belly cramps or pain.  You lose or gain weight quickly.  You have trouble catching your breath and have chest pain.  You notice sudden or extreme puffiness (swelling) of your face, hands, ankles, feet, or legs.  You have not felt the baby move in over an hour.  You have severe headaches that do not go away with medicine.  You have trouble seeing.  You are leaking, or you are having a gush of fluid, from your vagina before you are 37 weeks.  You have regular belly spasms (contractions) before you are 37 weeks. Summary  The third trimester is from week 28 through week 40 (months 7 through 9). This time is when your unborn baby is growing very fast.  Follow your doctor's advice about medicine, food, and  activity.  Get ready for the arrival of your baby by taking prenatal classes, getting all the baby items ready, preparing the baby's room, and visiting your doctor to be checked.  Get help right away if you are bleeding from your vagina, or you have chest pain and trouble catching your breath, or if you have not felt your baby move in over an hour. This information is not intended to replace advice given to you by your health care provider. Make sure you discuss any questions you have with your health care provider. Document Released: 03/09/2010 Document Revised: 04/05/2019 Document Reviewed: 01/18/2017 Elsevier Patient Education  2020 [employer] Health Maintenance, Female Adopting a healthy lifestyle and getting preventive care are important in promoting health and wellness. Ask your health care provider about:  The right schedule for you to have regular tests and exams.  Things you can do on your own to prevent diseases and keep yourself healthy. What should I know about diet, weight, and exercise? Eat a healthy diet   Eat a diet that includes plenty of vegetables, fruits, low-fat dairy products, and lean protein.  Do not eat a lot of foods  that are high in solid fats, added sugars, or sodium. Maintain a healthy weight Body mass index (BMI) is used to identify weight problems. It estimates body fat based on height and weight. Your health care provider can help determine your BMI and help you achieve or maintain a healthy weight. Get regular exercise Get regular exercise. This is one of the most important things you can do for your health. Most adults should:  Exercise for at least 150 minutes each week. The exercise should increase your heart rate and make you sweat (moderate-intensity exercise).  Do strengthening exercises at least twice a week. This is in addition to the moderate-intensity exercise.  Spend less time sitting. Even light physical activity can be beneficial. Watch cholesterol and blood lipids Have your blood tested for lipids and cholesterol at 28 years of age, then have this test every 5 years. Have your cholesterol levels checked more often if:  Your lipid or cholesterol levels are high.  You are older than 28 years of age.  You are at high risk for heart disease. What should I know about cancer screening? Depending on your health history and family history, you may need to have cancer screening at various ages. This may include screening for:  Breast cancer.  Cervical cancer.  Colorectal cancer.  Skin cancer.  Lung cancer. What should I know about heart disease, diabetes, and high blood pressure? Blood pressure and heart disease  High blood pressure causes heart disease and increases the risk of stroke. This is more likely to develop in people who have high blood pressure readings, are of African descent, or are overweight.  Have your blood pressure checked: ? Every 3-5 years if you are 3518-28 years of age. ? Every year if you are 28 years old or older. Diabetes Have regular diabetes screenings. This checks your fasting blood sugar level. Have the screening done:  Once every three years after  age 28 if you are at a normal weight and have a low risk for diabetes.  More often and at a younger age if you are overweight or have a high risk for diabetes. What should I know about preventing infection? Hepatitis B If you have a higher risk for hepatitis B, you should be screened for this virus. Talk with your health care provider to find  out if you are at risk for hepatitis B infection. Hepatitis C Testing is recommended for:  Everyone born from [birth year] through 1965.  Anyone with known risk factors for hepatitis C. Sexually transmitted infections (STIs)  Get screened for STIs, including gonorrhea and chlamydia, if: ? You are sexually active and are younger than 28 years of age. ? You are older than 28 years of age and your health care provider tells you that you are at risk for this type of infection. ? Your sexual activity has changed since you were last screened, and you are at increased risk for chlamydia or gonorrhea. Ask your health care provider if you are at risk.  Ask your health care provider about whether you are at high risk for HIV. Your health care provider may recommend a prescription medicine to help prevent HIV infection. If you choose to take medicine to prevent HIV, you should first get tested for HIV. You should then be tested every 3 months for as long as you are taking the medicine. Pregnancy  If you are about to stop having your period (premenopausal) and you may become pregnant, seek counseling before you get pregnant.  Take 400 to 800 micrograms (mcg) of folic acid every day if you become pregnant.  Ask for birth control (contraception) if you want to prevent pregnancy. Osteoporosis and menopause Osteoporosis is a disease in which the bones lose minerals and strength with aging. This can result in bone fractures. If you are 32 years old or older, or if you are at risk for osteoporosis and fractures, ask your health care provider if you should:  Be screened for  bone loss.  Take a calcium or vitamin D supplement to lower your risk of fractures.  Be given hormone replacement therapy (HRT) to treat symptoms of menopause. Follow these instructions at home: Lifestyle  Do not use any products that contain nicotine or tobacco, such as cigarettes, e-cigarettes, and chewing tobacco. If you need help quitting, ask your health care provider.  Do not use street drugs.  Do not share needles.  Ask your health care provider for help if you need support or information about quitting drugs. Alcohol use  Do not drink alcohol if: ? Your health care provider tells you not to drink. ? You are pregnant, may be pregnant, or are planning to become pregnant.  If you drink alcohol: ? Limit how much you use to 0-1 drink a day. ? Limit intake if you are breastfeeding.  Be aware of how much alcohol is in your drink. In the [place] one drink equals one 12 oz bottle of beer (355 mL), one 5 oz glass of wine (148 mL), or one 1 oz glass of hard liquor (44 mL). General instructions  Schedule regular health, dental, and eye exams.  Stay current with your vaccines.  Tell your health care provider if: ? You often feel depressed. ? You have ever been abused or do not feel safe at home. Summary  Adopting a healthy lifestyle and getting preventive care are important in promoting health and wellness.  Follow your health care provider's instructions about healthy diet, exercising, and getting tested or screened for diseases.  Follow your health care provider's instructions on monitoring your cholesterol and blood pressure. This information is not intended to replace advice given to you by your health care provider. Make sure you discuss any questions you have with your health care provider. Document Released: 06/28/2011 Document Revised: 12/06/2018 Document Reviewed: 12/06/2018 Elsevier Patient  Education © 2020 Elsevier Inc. ° °

## 2019-06-26 NOTE — Progress Notes (Signed)
Bliss A Keelan DOB: 28 y.o. MRN: [id number]  Subjective:  Here for Biometric Screen/brief exam Patient is a 28 year old female in no acute distress who comes to the clinic for a biometric exam/ labs.   She ate bacon this morning and is not fasting.   G2P1 She is due with her 2nd child a boy on July 31st.She reports she is doing well. Normal fetal kick counts, denies any pressure or pain. She denies any vaginal bleeding, pain or discharge.  Patient  denies any fever, body aches,chills, rash, chest pain, shortness of breath, nausea, vomiting, or diarrhea.   Objective:  Blood pressure 108/60, pulse 98, temperature 97.9 F (36.6 C), temperature source Oral, resp. rate 18, height 5\' 3"  (1.6 m), weight 223 lb (101.2 kg), last menstrual period 10/20/2018, SpO2 100 %.  NAD Patient is alert and oriented and responsive to questions Engages in eye contact with provider. Speaks in full sentences without any pauses without any shortness of breath or distress.  HEENT: Within normal limits Neck: Normal Heart: Regular rate and rhythm Lungs: Clear no adventitious sounds auscultated.  Abdomen: round, pregnant  Patient moves on and off of exam table and in room without difficulty. Gait is normal in hall and in room. Patient is oriented to person place time and situation. Patient answers questions appropriately and engages in conversation.   Assessment: Biometric screen   Plan: Follow up with your obstetrician and primary care as recommended by their office.  Fasting glucose and lipids. Discussed with patient that today's visit here is a limited biometric screening visit (not a comprehensive exam or management of any chronic problems) Discussed some health issues, including healthy eating habits and exercise. Encouraged to follow-up with PCP for annual comprehensive preventive and wellness care (and if applicable, any chronic  issues). Questions invited and answered.   I will have the office call you on your glucose and cholesterol results when they return if you have not heard within 1 week please call the office.  This biometric physical is a brief physical and the only labs done are glucose and your lipid panel(cholesterol) and is  not a substitute for seeing a primary care provider for a complete annual physical. Please see a primary care physician for routine health maintenance, labs and full physical at least yearly and follow up as recommended by your provider. Provider also recommends if you do not have a primary care provider for patient to establish care as soon as possible .Patient may chose provider of choice. Also gave the [place] at [phone] or web site at [email or web address] [email or web address] to help assist with finding a primary care doctor.  Patient verbalizes understanding that his office is acute care only and not a substitute for a primary care or for the management of chronic conditions.

## 2019-06-27 ENCOUNTER — Telehealth: Payer: Self-pay

## 2019-06-27 NOTE — Telephone Encounter (Signed)
Coronavirus (COVID-19) Are you at risk?  Are you at risk for the Coronavirus (COVID-19)?  To be considered HIGH RISK for Coronavirus (COVID-19), you have to meet the following criteria:  . Traveled to [place], [place], [place], [place] or [place]; or in the [place] to [place], [place], [place], or [place]; and have fever, cough, and shortness of breath within the last 2 weeks of travel OR . Been in close contact with a person diagnosed with COVID-19 within the last 2 weeks and have fever, cough, and shortness of breath . IF YOU DO NOT MEET THESE CRITERIA, YOU ARE CONSIDERED LOW RISK FOR COVID-19.  What to do if you are HIGH RISK for COVID-19?  . If you are having a medical emergency, call 911. . Seek medical care right away. Before you go to a doctor's office, urgent care or emergency department, call ahead and tell them about your recent travel, contact with someone diagnosed with COVID-19, and your symptoms. You should receive instructions from your physician's office regarding next steps of care.  . When you arrive at healthcare provider, tell the healthcare staff immediately you have returned from visiting [place], [place], [place], [place] or [place]; or traveled in the [place] to [place], [place], [place], or [place]; in the last two weeks or you have been in close contact with a person diagnosed with COVID-19 in the last 2 weeks.   . Tell the health care staff about your symptoms: fever, cough and shortness of breath. . After you have been seen by a medical provider, you will be either: o Tested for (COVID-19) and discharged home on quarantine except to seek medical care if symptoms worsen, and asked to  - Stay home and avoid contact with others until you get your results (4-5 days)  - Avoid travel on public transportation if possible (such as bus, train, or airplane) or o Sent to the Emergency Department by EMS for evaluation, COVID-19 testing, and possible  admission depending on your condition and test results.  What to do if you are LOW RISK for COVID-19?  Reduce your risk of any infection by using the same precautions used for avoiding the common cold or flu:  . Wash your hands often with soap and warm water for at least 20 seconds.  If soap and water are not readily available, use an alcohol-based hand sanitizer with at least 60% alcohol.  . If coughing or sneezing, cover your mouth and nose by coughing or sneezing into the elbow areas of your shirt or coat, into a tissue or into your sleeve (not your hands). . Avoid shaking hands with others and consider head nods or verbal greetings only. . Avoid touching your eyes, nose, or mouth with unwashed hands.  . Avoid close contact with people who are Shannon [name]. . Avoid places or events with large numbers of people in one location, like concerts or sporting events. . Carefully consider travel plans you have or are making. . If you are planning any travel outside or inside the [place], visit the CDC's Travelers' Health webpage for the latest health notices. . If you have some symptoms but not all symptoms, continue to monitor at home and seek medical attention if your symptoms worsen. . If you are having a medical emergency, call 911.  06/27/19 SCREENING NEG SLS ADDITIONAL HEALTHCARE OPTIONS FOR PATIENTS  [facility] / e-Visit: [email or web address]         [facility]: [phone]    [facility]: [phone]                   [facility]: [phone]  

## 2019-06-28 ENCOUNTER — Ambulatory Visit (INDEPENDENT_AMBULATORY_CARE_PROVIDER_SITE_OTHER): Payer: Managed Care, Other (non HMO) | Admitting: Certified Nurse Midwife

## 2019-06-28 ENCOUNTER — Ambulatory Visit (INDEPENDENT_AMBULATORY_CARE_PROVIDER_SITE_OTHER): Payer: Managed Care, Other (non HMO)

## 2019-06-28 ENCOUNTER — Ambulatory Visit: Payer: Managed Care, Other (non HMO) | Admitting: Adult Health

## 2019-06-28 ENCOUNTER — Other Ambulatory Visit: Payer: Self-pay

## 2019-06-28 ENCOUNTER — Telehealth: Payer: Self-pay | Admitting: *Deleted

## 2019-06-28 ENCOUNTER — Encounter: Payer: Self-pay | Admitting: Adult Health

## 2019-06-28 VITALS — BP 109/82 | HR 104 | Wt 223.0 lb

## 2019-06-28 DIAGNOSIS — J069 Acute upper respiratory infection, unspecified: Secondary | ICD-10-CM

## 2019-06-28 DIAGNOSIS — Z3493 Encounter for supervision of normal pregnancy, unspecified, third trimester: Secondary | ICD-10-CM

## 2019-06-28 DIAGNOSIS — Z20828 Contact with and (suspected) exposure to other viral communicable diseases: Secondary | ICD-10-CM

## 2019-06-28 DIAGNOSIS — Z20822 Contact with and (suspected) exposure to covid-19: Secondary | ICD-10-CM

## 2019-06-28 DIAGNOSIS — Z7189 Other specified counseling: Secondary | ICD-10-CM | POA: Diagnosis not present

## 2019-06-28 DIAGNOSIS — Z3A36 36 weeks gestation of pregnancy: Secondary | ICD-10-CM

## 2019-06-28 LAB — POCT URINALYSIS DIPSTICK OB
Bilirubin, UA: NEGATIVE
Blood, UA: NEGATIVE
Glucose, UA: NEGATIVE
Nitrite, UA: NEGATIVE
Spec Grav, UA: 1.02 (ref 1.010–1.025)
Urobilinogen, UA: 0.2 E.U./dL
pH, UA: 6 (ref 5.0–8.0)

## 2019-06-28 NOTE — Progress Notes (Signed)
ROB-Doing well. Growth ultrasound today WNL, see below. Requests scheduled IOL due to spouse starting new job with 12 week orientation approximately two (2) hours away. IOL Discussed with Dr. Marcelline Mates and Lorelle Gibbs, CNM; will schedule patient for August 1 at midnight, if available. Anticipatory guidance regarding course of prenatal care. Reviewed red flag symptoms and when to call. RTC x 36 wk cultures and ROB or sooner if needed.   ULTRASOUND REPORT  Location: [facility] Date of Service: 06/28/2019   Indications:growth/afi Findings:  Shannon [name] intrauterine pregnancy is visualized with FHR at 172 BPM. Biometrics give an (U/S) Gestational age of [redacted]w[redacted]d and an (U/S) EDD of 07/23/2019; this correlates with the clinically established Estimated Date of Delivery: 07/27/19.  Fetal presentation is Cephalic.  Placenta: anterior. Grade: 2 AFI: 9.4 cm  Growth percentile is 60. EFW: 3018 g (6 lbs 10 oz)  Impression: 1. [redacted]w[redacted]d Viable Singleton Intrauterine pregnancy previously established criteria. 2. Growth is 60 %ile.  AFI is 9.4 cm.   Recommendations: 1.Clinical correlation with the patient's History and Physical Exam.

## 2019-06-28 NOTE — Progress Notes (Signed)
ROB-No complaints.  

## 2019-06-28 NOTE — Telephone Encounter (Signed)
-----   Message from Doreen Beam, [place] sent at 06/28/2019 12:48 PM EDT ----- Needs testing Shannon [name] [place] - symptomatic / pregnant deputy.   Thanks, Laverna Peace MSN, AGNP-C, FNP-C

## 2019-06-28 NOTE — Telephone Encounter (Signed)
Spoke with patient, scheduled her for COVID 19 testing today at 2:30 pm at [facility].  Testing protocol reviewed with patient.

## 2019-06-28 NOTE — Patient Instructions (Signed)

## 2019-06-28 NOTE — Patient Instructions (Signed)
Person Under Monitoring Name: Shannon [name]  Location: [address] [place] [id number]   Infection Prevention Recommendations for Individuals Confirmed to have, or Being Evaluated for, 2019 Novel Coronavirus (COVID-19) Infection Who Receive Care at Home  Individuals who are confirmed to have, or are being evaluated for, COVID-19 should follow the prevention steps below until a healthcare provider or local or state health department says they can return to normal activities.  Stay home except to get medical care You should restrict activities outside your home, except for getting medical care. Do not go to work, school, or public areas, and do not use public transportation or taxis.  Call ahead before visiting your doctor Before your medical appointment, call the healthcare provider and tell them that you have, or are being evaluated for, COVID-19 infection. This will help the healthcare provider's office take steps to keep other people from getting infected. Ask your healthcare provider to call the local or state health department.  Monitor your symptoms Seek prompt medical attention if your illness is worsening (e.g., difficulty breathing). Before going to your medical appointment, call the healthcare provider and tell them that you have, or are being evaluated for, COVID-19 infection. Ask your healthcare provider to call the local or state health department.  Wear a facemask You should wear a facemask that covers your nose and mouth when you are in the same room with other people and when you visit a healthcare provider. People who live with or visit you should also wear a facemask while they are in the same room with you.  Separate yourself from other people in your home As much as possible, you should stay in a different room from other people in your home. Also, you should use a separate bathroom, if available.  Avoid sharing household items You should not  share dishes, drinking glasses, cups, eating utensils, towels, bedding, or other items with other people in your home. After using these items, you should wash them thoroughly with soap and water.  Cover your coughs and sneezes Cover your mouth and nose with a tissue when you cough or sneeze, or you can cough or sneeze into your sleeve. Throw used tissues in a lined trash can, and immediately wash your hands with soap and water for at least 20 seconds or use an alcohol-based hand rub.  Wash your [employer]hands Wash your hands often and thoroughly with soap and water for at least 20 seconds. You can use an alcohol-based hand sanitizer if soap and water are not available and if your hands are not visibly dirty. Avoid touching your eyes, nose, and mouth with unwashed hands.   Prevention Steps for Caregivers and Household Members of Individuals Confirmed to have, or Being Evaluated for, COVID-19 Infection Being Cared for in the Home  If you live with, or provide care at home for, a person confirmed to have, or being evaluated for, COVID-19 infection please follow these guidelines to prevent infection:  Follow healthcare provider's instructions Make sure that you understand and can help the patient follow any healthcare provider instructions for all care.  Provide for the patient's basic needs You should help the patient with basic needs in the home and provide support for getting groceries, prescriptions, and other personal needs.  Monitor the patient's symptoms If they are getting sicker, call his or her medical provider and tell them that the patient has, or is being evaluated for, COVID-19 infection. This will help the healthcare  provider's office take steps to keep other people from getting infected. Ask the healthcare provider to call the local or state health department.  Limit the number of people who have contact with the patient  If possible, have only one caregiver for the patient.   Other household members should stay in another home or place of residence. If this is not possible, they should stay  in another room, or be separated from the patient as much as possible. Use a separate bathroom, if available.  Restrict visitors who do not have an essential need to be in the home.  Keep older adults, very young children, and other sick people away from the patient Keep older adults, very young children, and those who have compromised immune systems or chronic health conditions away from the patient. This includes people with chronic heart, lung, or kidney conditions, diabetes, and cancer.  Ensure good ventilation Make sure that shared spaces in the home have good air flow, such as from an air conditioner or an opened window, weather permitting.  Wash your hands often  Wash your hands often and thoroughly with soap and water for at least 20 seconds. You can use an alcohol based hand sanitizer if soap and water are not available and if your hands are not visibly dirty.  Avoid touching your eyes, nose, and mouth with unwashed hands.  Use disposable paper towels to dry your hands. If not available, use dedicated cloth towels and replace them when they become wet.  Wear a facemask and gloves  Wear a disposable facemask at all times in the room and gloves when you touch or have contact with the patient's blood, body fluids, and/or secretions or excretions, such as sweat, saliva, sputum, nasal mucus, vomit, urine, or feces.  Ensure the mask fits over your nose and mouth tightly, and do not touch it during use.  Throw out disposable facemasks and gloves after using them. Do not reuse.  Wash your hands immediately after removing your facemask and gloves.  If your personal clothing becomes contaminated, carefully remove clothing and launder. Wash your hands after handling contaminated clothing.  Place all used disposable facemasks, gloves, and other waste in a lined container  before disposing them with other household waste.  Remove gloves and wash your hands immediately after handling these items.  Do not share dishes, glasses, or other household items with the patient  Avoid sharing household items. You should not share dishes, drinking glasses, cups, eating utensils, towels, bedding, or other items with a patient who is confirmed to have, or being evaluated for, COVID-19 infection.  After the person uses these items, you should wash them thoroughly with soap and water.  Wash laundry thoroughly  Immediately remove and wash clothes or bedding that have blood, body fluids, and/or secretions or excretions, such as sweat, saliva, sputum, nasal mucus, vomit, urine, or feces, on them.  Wear gloves when handling laundry from the patient.  Read and follow directions on labels of laundry or clothing items and detergent. In general, wash and dry with the warmest temperatures recommended on the label.  Clean all areas the individual has used often  Clean all touchable surfaces, such as counters, tabletops, doorknobs, bathroom fixtures, toilets, phones, keyboards, tablets, and bedside tables, every day. Also, clean any surfaces that may have blood, body fluids, and/or secretions or excretions on them.  Wear gloves when cleaning surfaces the patient has come in contact with.  Use a diluted bleach solution (e.g., dilute bleach with  1 part bleach and 10 parts water) or a household disinfectant with a label that says EPA-registered for coronaviruses. To make a bleach solution at home, add 1 tablespoon of bleach to 1 quart (4 cups) of water. For a larger supply, add  cup of bleach to 1 gallon (16 cups) of water.  Read labels of cleaning products and follow recommendations provided on product labels. Labels contain instructions for safe and effective use of the cleaning product including precautions you should take when applying the product, such as wearing gloves or eye  protection and making sure you have good ventilation during use of the product.  Remove gloves and wash hands immediately after cleaning.  Monitor yourself for signs and symptoms of illness Caregivers and household members are considered close contacts, should monitor their health, and will be asked to limit movement outside of the home to the extent possible. Follow the monitoring steps for close contacts listed on the symptom monitoring form.   ? If you have additional questions, contact your local health department or call the epidemiologist on call at [phone] (available 24/7). ? This guidance is subject to change. For the most up-to-date guidance from CDC, please refer to their website: YouBlogs.pl   Pregnancy and COVID-19 Coronavirus disease, also called COVID-19, is an infection of the lungs and airways (respiratory tract). It is unclear at this time if pregnancy makes it more likely for you to get COVID-19, or what effects the infection may have on your unborn baby. However, pregnancy causes changes to your heart, lungs, and the body's disease-fighting system (immune system). Some of these changes make it more likely for you to get sick and have more serious illness. Therefore, it is important for you to take precautions in order to protect yourself and your unborn baby. Work with your health care team to develop a plan to protect yourself from all infections, including COVID-19. This is one way for you to stay healthy during your pregnancy and to keep your baby healthy as well. How does this affect me? If you get COVID-19, there is a risk that you may:  Get a respiratory illness that can lead to pneumonia.  Give birth to your baby before 37 weeks of pregnancy (premature birth). If you have or may have COVID-19, your health care provider may recommend special precautions around your pregnancy. This may affect how you:   Receive care before delivery (prenatal care). How you visit your health care provider may change. Tests and scans may need to be performed differently.  Receive care during labor and delivery. This may affect your birth plan, including who may be with you during labor and delivery.  Receive care after you deliver your baby (postpartum care). You may stay longer in the hospital, and in a special room. Your baby may also need to stay away from you.  Feed your baby after he or she is born. Pregnancy can be an especially stressful time because of the changes in your body and the preparation involved in becoming a parent. In addition, you may be feeling especially fearful, anxious, or stressed because of COVID-19 and how it is affecting you. How does this affect my baby? It is not known whether a mother will transmit the virus to her unborn baby. There is a risk that if you get COVID-19:  The virus that causes COVID-19 can pass to your baby.  You may have premature birth. Your baby may require more medical care if this happens. What can  I do to lower my risk?  There is no vaccine to help prevent COVID-19. However, there are actions that you can take to protect yourself and others from this virus. Cleaning and personal hygiene  Wash your hands often with soap and water for at least 20 seconds. If soap and water are not available, use alcohol-based hand sanitizer.  Avoid touching your mouth, face, eyes, or nose.  Clean and disinfect objects and surfaces that are frequently touched every day. This may include: ? Counters and tables. ? Doorknobs and light switches. ? Sinks and faucets. ? Electronics such as phones, remote controls, keyboards, computers, and tablets. Stay away from others  Stay away from people who are sick, if possible.  Avoid social gatherings and travel.  Stay home as much as possible. Follow these instructions: Breastfeeding It is not known if the virus that causes  COVID-19 can pass through breast milk to your baby. You should make a plan for feeding your infant with your family and your health care team. If you have or may have COVID-19, your health care provider may recommend that you take precautions while breastfeeding, such as:  Washing your hands before feeding your baby.  Wearing a mask while feeding your baby.  Pumping or expressing breast milk to feed to your baby. If possible, ask someone in your household who is not sick to feed your baby the expressed breast milk. ? Wash your hands before touching pump parts. ? Wash and disinfect all pump parts after expressing milk. Follow the manufacturer's instructions to clean and disinfect all pump parts. General instructions  If you think you have a COVID-19 infection, contact your health care provider right away. Tell your health care provider that you think you may have a COVID-19 infection.  Follow your health care provider's instructions on taking medicines. Some medicines may be unsafe to take during pregnancy.  Cover your mouth and nose by wearing a mask or other cloth covering over your face when you go out in public.  Find ways to manage stress. These may include: ? Using relaxation techniques like meditation and deep breathing. ? Getting regular exercise. Most women can continue their usual exercise routine during pregnancy. Ask your health care provider what activities are safe for you. ? Seeking support from family, friends, or spiritual resources. If you cannot be together in person, you can still connect by phone calls, texts, video calls, or online messaging. ? Spending time doing relaxing activities that you enjoy, like listening to music or reading a good book.  Ask for help if you have counseling or nutritional needs during pregnancy. Your health care provider can offer advice or refer you to resources or specialists who can help you with various needs.  Keep all follow-up visits as  told by your health care provider. This is important. Where to find more information Centers for Disease Control and Prevention (CDC): AffordableShare.com.brwww.cdc.gov/coronavirus/2019-ncov/ World Health Organization [facility](WHO): PokerPortraits.eswww.who.int/news-room/q-a-detail/q-a-on-covid-19-pregnancy-childbirth-and-breastfeeding [employer]nec[employer] (ACOG): BuyDucts.dkwww.acog.org/patient-resources/faqs/pregnancy/coronavirus-pregnancy-and-breastfeeding Questions to ask your health care team  What should I do if I have COVID-19 symptoms?  How will COVID-19 affect my prenatal care visits, tests and scans, labor and delivery, and postpartum care?  Should I plan to breastfeed my baby?  Where can I find mental health resources?  Where can I find support if I have financial concerns? Contact a health care provider if:  You have signs and symptoms of infection, including a fever or cough. Tell your health care team that you  think you may have a COVID-19 infection.  You have strong emotions, such as sadness or anxiety.  You feel unsafe in your home and need help finding a safe place to live.  You have bloody or watery vaginal discharge or vaginal bleeding. Get help right away if:  You have signs or symptoms of labor before 37 weeks of pregnancy. These include: ? Contractions that are 5 minutes or less apart, or that increase in frequency, intensity, or length. ? Sudden, sharp pain in the abdomen or in the lower back. ? A gush or trickle of fluid from your vagina.  You have difficulty breathing.  You have chest pain. These symptoms may represent a serious problem that is an emergency. Do not wait to see if the symptoms will go away. Get medical help right away. Call your local emergency services (911 in the [place] Do not drive yourself to the hospital. Summary  Coronavirus disease, also called COVID-19, is an infection of the lungs and airways (respiratory tract). It is unclear at this time if pregnancy  makes you more susceptible to COVID-19 and what effects it may have on unborn babies.  It is important to take precautions to protect yourself and your developing baby. This includes washing your hands often, avoiding touching your mouth, face, eyes, or nose, avoiding social gatherings and travel, and staying away from people who are sick.  If you think you have a COVID-19 infection, contact your health care provider right away. Tell your health care provider that you think you may have a COVID-19 infection.  If you have or may have COVID-19, your health care provider may recommend special precautions during your pregnancy, labor and delivery, and after your baby is born. This information is not intended to replace advice given to you by your health care provider. Make sure you discuss any questions you have with your health care provider. Document Released: 04/10/2019 Document Revised: 04/10/2019 Document Reviewed: 04/10/2019 Elsevier Patient Education  2020 [employer]

## 2019-06-28 NOTE — Progress Notes (Signed)
Virtual Visit via Telephone Note  I connected with Shannon [name] on 06/28/19 at 12:30 PM EDT by telephone and verified that I am speaking with the correct person using two identifiers.  Location: Patient: at her home  Provider: [facility], [place], [place]    I discussed the limitations, risks, security and privacy concerns of performing an evaluation and management service by telephone and the availability of in person appointments. I also discussed with the patient that there may be a patient responsible charge related to this service. The patient expressed understanding and agreed to proceed.   History of Present Illness: Patient is a 28 year old female in no acute distress who calls the office for a telephone visit.   She reports on her My chart screening that she has congested nose, pain around the nose, headache, cough, throat pain. Clear nasal discharge.  Onset : 06/27/2019 sore throat, cough onset 2 days ago on 6/30/ 20  Non smoker.   G2P1 She is due with her 2nd child a boy on July 31st.She reports she is doing well. Normal fetal kick counts, denies any pressure or pain. She denies any vaginal bleeding, pain or discharge.   Afebrile.  Non smoker  Her 54 year old daughter is also sick with sneezing and running nose. Her mother and a few other people in her extended family are sick as well.   Patient  denies any fever, body aches,chills, rash, chest pain, shortness of breath, nausea, vomiting, or diarrhea.  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   Observations/Objective: Afebrile  No other vitals available.  Patient is alert and oriented and responsive to questions Engages in conversation with provider. Speaks in full sentences without any pauses without any shortness of breath or distress.    Assessment and Plan: 1. Acute upper respiratory infection   2. Educated About Covid-19 Virus Infection   3. Exposure to Covid-19  Virus- possible    4. Pregnant and not yet delivered in third trimester    Discussed symptomatic treatment, use of nasal saline per package instructions. Also discussed other approved treatments per her OBGYN office for symptomatic treatment.   She is to monitor fetal kick counts, hydrate and call her OB GYN should any symptoms occur or any symptoms worsen.   Ordered a Covid test for today.  Discussed quarantine for 14 full days, and return to work on July 16th if fever free for 72 hours, and no fever reducing medications as well as all symptoms improving.  Follow Up Instructions:  Advised patient call the office or your primary care doctor for an appointment if no improvement within 72 hours or if any symptoms change or worsen at any time  Advised ER or urgent Care if after hours or on weekend. Call 911 for emergency symptoms at any time.Patinet verbalized understanding of all instructions given/reviewed and treatment plan and has no further questions or concerns at this time.       I discussed the assessment and treatment plan with the patient. The patient was provided an opportunity to ask questions and all were answered. The patient agreed with the plan and demonstrated an understanding of the instructions.   The patient was advised to call back or seek an in-person evaluation if the symptoms worsen or if the condition fails to improve as anticipated.  I provided 15  minutes of non-face-to-face time during this encounter.   [place] Evita Merida,  FNP

## 2019-06-29 ENCOUNTER — Emergency Department (HOSPITAL_COMMUNITY): Payer: Managed Care, Other (non HMO)

## 2019-06-29 ENCOUNTER — Encounter (HOSPITAL_COMMUNITY): Payer: Self-pay | Admitting: Emergency Medicine

## 2019-06-29 ENCOUNTER — Other Ambulatory Visit: Payer: Self-pay

## 2019-06-29 ENCOUNTER — Inpatient Hospital Stay (HOSPITAL_COMMUNITY)
Admission: EM | Admit: 2019-06-29 | Discharge: 2019-07-02 | DRG: 831 | Disposition: A | Discharge status: Home / Self Care | Payer: Managed Care, Other (non HMO) | Attending: Family Medicine | Admitting: Family Medicine

## 2019-06-29 DIAGNOSIS — O9989 Other specified diseases and conditions complicating pregnancy, childbirth and the puerperium: Secondary | ICD-10-CM | POA: Diagnosis present

## 2019-06-29 DIAGNOSIS — R0902 Hypoxemia: Secondary | ICD-10-CM | POA: Diagnosis present

## 2019-06-29 DIAGNOSIS — R7303 Prediabetes: Secondary | ICD-10-CM | POA: Diagnosis present

## 2019-06-29 DIAGNOSIS — Z888 Allergy status to other drugs, medicaments and biological substances status: Secondary | ICD-10-CM

## 2019-06-29 DIAGNOSIS — O99513 Diseases of the respiratory system complicating pregnancy, third trimester: Secondary | ICD-10-CM | POA: Diagnosis present

## 2019-06-29 DIAGNOSIS — O99013 Anemia complicating pregnancy, third trimester: Secondary | ICD-10-CM | POA: Diagnosis present

## 2019-06-29 DIAGNOSIS — O99343 Other mental disorders complicating pregnancy, third trimester: Secondary | ICD-10-CM | POA: Diagnosis present

## 2019-06-29 DIAGNOSIS — R0682 Tachypnea, not elsewhere classified: Secondary | ICD-10-CM | POA: Diagnosis not present

## 2019-06-29 DIAGNOSIS — Z8659 Personal history of other mental and behavioral disorders: Secondary | ICD-10-CM

## 2019-06-29 DIAGNOSIS — O98513 Other viral diseases complicating pregnancy, third trimester: Principal | ICD-10-CM | POA: Diagnosis present

## 2019-06-29 DIAGNOSIS — O98913 Unspecified maternal infectious and parasitic disease complicating pregnancy, third trimester: Secondary | ICD-10-CM | POA: Diagnosis not present

## 2019-06-29 DIAGNOSIS — J208 Acute bronchitis due to other specified organisms: Secondary | ICD-10-CM | POA: Diagnosis present

## 2019-06-29 DIAGNOSIS — Z79899 Other long term (current) drug therapy: Secondary | ICD-10-CM | POA: Diagnosis not present

## 2019-06-29 DIAGNOSIS — Z6833 Body mass index (BMI) 33.0-33.9, adult: Secondary | ICD-10-CM

## 2019-06-29 DIAGNOSIS — R0602 Shortness of breath: Secondary | ICD-10-CM | POA: Diagnosis present

## 2019-06-29 DIAGNOSIS — U071 COVID-19: Secondary | ICD-10-CM | POA: Diagnosis present

## 2019-06-29 DIAGNOSIS — J9601 Acute respiratory failure with hypoxia: Secondary | ICD-10-CM | POA: Diagnosis present

## 2019-06-29 DIAGNOSIS — Z3A36 36 weeks gestation of pregnancy: Secondary | ICD-10-CM

## 2019-06-29 DIAGNOSIS — Z20822 Contact with and (suspected) exposure to covid-19: Secondary | ICD-10-CM

## 2019-06-29 DIAGNOSIS — F329 Major depressive disorder, single episode, unspecified: Secondary | ICD-10-CM | POA: Diagnosis present

## 2019-06-29 DIAGNOSIS — D509 Iron deficiency anemia, unspecified: Secondary | ICD-10-CM | POA: Diagnosis present

## 2019-06-29 DIAGNOSIS — R6889 Other general symptoms and signs: Secondary | ICD-10-CM | POA: Diagnosis not present

## 2019-06-29 DIAGNOSIS — Z9981 Dependence on supplemental oxygen: Secondary | ICD-10-CM | POA: Diagnosis not present

## 2019-06-29 DIAGNOSIS — O99213 Obesity complicating pregnancy, third trimester: Secondary | ICD-10-CM | POA: Diagnosis present

## 2019-06-29 LAB — BASIC METABOLIC PANEL
Anion gap: 12 (ref 5–15)
BUN: 5 mg/dL — ABNORMAL LOW (ref 6–20)
CO2: 17 mmol/L — ABNORMAL LOW (ref 22–32)
Calcium: 8.4 mg/dL — ABNORMAL LOW (ref 8.9–10.3)
Chloride: 106 mmol/L (ref 98–111)
Creatinine, Ser: 0.47 mg/dL (ref 0.44–1.00)
GFR calc Af Amer: >60 mL/min (ref >60)
GFR calc non Af Amer: >60 mL/min (ref >60)
Glucose, Bld: 93 mg/dL (ref 70–99)
Potassium: 3.6 mmol/L (ref 3.5–5.1)
Sodium: 135 mmol/L (ref 135–145)

## 2019-06-29 LAB — CBC WITH DIFFERENTIAL/PLATELET
Abs Immature Granulocytes: 0.04 10*3/uL (ref 0.00–0.07)
Basophils Absolute: 0 10*3/uL (ref 0.0–0.1)
Basophils Relative: 0 %
Eosinophils Absolute: 0 10*3/uL (ref 0.0–0.5)
Eosinophils Relative: 0 %
HCT: 34.4 % — ABNORMAL LOW (ref 36.0–46.0)
Hemoglobin: 10.9 g/dL — ABNORMAL LOW (ref 12.0–15.0)
Immature Granulocytes: 0 %
Lymphocytes Relative: 19 %
Lymphs Abs: 1.9 10*3/uL (ref 0.7–4.0)
MCH: 25.8 pg — ABNORMAL LOW (ref 26.0–34.0)
MCHC: 31.7 g/dL (ref 30.0–36.0)
MCV: 81.3 fL (ref 80.0–100.0)
Monocytes Absolute: 0.8 10*3/uL (ref 0.1–1.0)
Monocytes Relative: 8 %
Neutro Abs: 7.3 10*3/uL (ref 1.7–7.7)
Neutrophils Relative %: 73 %
Platelets: 407 10*3/uL — ABNORMAL HIGH (ref 150–400)
RBC: 4.23 MIL/uL (ref 3.87–5.11)
RDW: 14 % (ref 11.5–15.5)
WBC: 10.1 10*3/uL (ref 4.0–10.5)
nRBC: 0 % (ref 0.0–0.2)

## 2019-06-29 LAB — LACTIC ACID, PLASMA: Lactic Acid, Venous: 0.7 mmol/L (ref 0.5–1.9)

## 2019-06-29 LAB — GROUP A STREP BY PCR: Group A Strep by PCR: NOT DETECTED

## 2019-06-29 LAB — SARS CORONAVIRUS 2 BY RT PCR (HOSPITAL ORDER, PERFORMED IN ~~LOC~~ HOSPITAL LAB): SARS Coronavirus 2: POSITIVE — AB

## 2019-06-29 MED ORDER — CALCIUM CARBONATE ANTACID 500 MG PO CHEW: 2.0000 | CHEWABLE_TABLET | ORAL | Status: DC | PRN

## 2019-06-29 MED ORDER — PSEUDOEPHEDRINE HCL 30 MG PO TABS
30.0000 mg | ORAL_TABLET | Freq: Once | ORAL | Status: AC
  Administered 2019-06-29: 22:00:00 30 mg via ORAL
  Filled 2019-06-29: qty 1

## 2019-06-29 MED ORDER — METOCLOPRAMIDE HCL 5 MG/ML IJ SOLN
10.0000 mg | Freq: Once | INTRAMUSCULAR | Status: AC
  Administered 2019-06-29: 21:00:00 10 mg via INTRAVENOUS
  Filled 2019-06-29: qty 2

## 2019-06-29 MED ORDER — ZOLPIDEM TARTRATE 5 MG PO TABS: 5.0000 mg | ORAL_TABLET | Freq: Every evening | ORAL | Status: DC | PRN

## 2019-06-29 MED ORDER — SODIUM CHLORIDE 0.9 % IV BOLUS
500.0000 mL | Freq: Once | INTRAVENOUS | Status: AC
  Administered 2019-06-29: 500 mL via INTRAVENOUS

## 2019-06-29 MED ORDER — PRENATAL MULTIVITAMIN CH
1.0000 | ORAL_TABLET | Freq: Every day | ORAL | Status: DC
  Administered 2019-06-30 – 2019-07-02 (×3): 1 via ORAL
  Filled 2019-06-29 (×6): qty 1

## 2019-06-29 MED ORDER — DOCUSATE SODIUM 100 MG PO CAPS
100.0000 mg | ORAL_CAPSULE | Freq: Every day | ORAL | Status: DC
  Administered 2019-06-30 – 2019-07-02 (×3): 100 mg via ORAL
  Filled 2019-06-29 (×3): qty 1

## 2019-06-29 MED ORDER — SODIUM CHLORIDE 0.9 % IV BOLUS: 1000.0000 mL | Freq: Once | INTRAVENOUS | Status: DC

## 2019-06-29 MED ORDER — ENOXAPARIN SODIUM 40 MG/0.4ML ~~LOC~~ SOLN
40.0000 mg | Freq: Every day | SUBCUTANEOUS | Status: DC
  Administered 2019-06-30: 40 mg via SUBCUTANEOUS
  Filled 2019-06-29: qty 0.4

## 2019-06-29 MED ORDER — ACETAMINOPHEN 325 MG PO TABS
650.0000 mg | ORAL_TABLET | ORAL | Status: DC | PRN
  Administered 2019-07-02: 650 mg via ORAL
  Filled 2019-06-29: qty 2

## 2019-06-29 MED ORDER — OXYMETAZOLINE HCL 0.05 % NA SOLN
1.0000 | Freq: Once | NASAL | Status: AC
  Administered 2019-06-29: 21:00:00 1 via NASAL
  Filled 2019-06-29: qty 30

## 2019-06-29 MED ORDER — ACETAMINOPHEN 500 MG PO TABS
1000.0000 mg | ORAL_TABLET | Freq: Once | ORAL | Status: AC
  Administered 2019-06-29: 21:00:00 1000 mg via ORAL
  Filled 2019-06-29: qty 2

## 2019-06-29 NOTE — ED Notes (Signed)
PA at the bedside.

## 2019-06-29 NOTE — ED Triage Notes (Signed)
Pt c/o fever, cough and headache x 3 days. Reports lower back pain and lower abdominal pain when coughing. Pt tested for COVID-19 yesterday, no result as of today. Pt [redacted] weeks pregnant, states baby has been less active today than normal. Per [facility], pt to be evaluated in [facility], rapid response RN to be paged when pt in a room.

## 2019-06-29 NOTE — Progress Notes (Signed)
5456 Arrived to evaluate 28 yo G3P1 @ 36 wks in with report of cough, back pain and decreased fetal movement. Pt is a PUI for Covid 19. Pt is febrile and has cough with thick green sputum.  Pt denies vaginal bleeding or LOF but reports some abdominal cramping and low back pain.1900 FHR Category I, no UC's tracing.  Dr. Kennon Rounds notified of patient in ED and requests contact from pts EDP.  1915 Pt is OB cleared by Dr. Kennon Rounds.

## 2019-06-29 NOTE — ED Notes (Signed)
This RN stood the patient up to walk her and she complained of shortness of breath while standing. Her oxygen saturation dropped to 89% but increased back up to 97% after standing for a little bit. This RN placed the patient on 2L [place] and the patient states she can breathe better.

## 2019-06-29 NOTE — Consult Note (Addendum)
[facility]  Impression: Active Problems:   Suspected Covid-19 Virus Infection   Hypoxia   Pregnancy and infectious disease in third trimester  Stable fetal status, no signs of labor  Recommendations: Admit to [facility] for full COVID supportive management - appreciate the support  COVID Pregnancy Admission Checklist  1) A formal consult note with detailed plan for fetal monitoring and obstetric care during the hospital course - done NST BID ordered Lovenox ordered 2) Discussion with patient about possible need for emergent cesarean delivery and consent on chart - done Consent to chart 3) Highlighted on [facility] list - done 4) Review chart to see if exposure can be traced to recent office or MAU visits, and do contact traces - message sent to office of [facility] and to Kerry Hough 5) Remote daily progress note from MD (Second Attending) 6) MFM Consult - in am--Ordered--Texted Dannielle Karvonen 7) NICU Consult - in am--Ordered--called Starleen Arms 8) Ensure the [facility] team and multidisciplinary team is always aware of the patient/Safety Rounds - notified [facility] Jarvis Morgan 9) Notify Drs. Harraway-Smith and Darron Doom of patient's admission - emailed 10) Add patient to [facility] list for outpatient follow up - done   Reason for consult: Patient is a 28 y.o. G27P1011 female who was admitted with Hypoxia on ambulation and presumed COVID. Several people who attended a party with her were tested. No one's test is back. Symptoms began 2 days ago with sore throat and headahce. Some upper airway congestion. Fever last pm and today. Seen remotely and ordered COVID test, send out, done yesterday. O2 in ED initially 92%, then ok until they ambulated her and her O2 dropped to 89%. The RROB evaluated the fetus and found a reactive NST. She reports good fetal movement and no contractions, leaking fluid or bleeding. Has h/a, fatigue, malaise, chest pain.  Past Medical History:  Diagnosis Date  .  Anxiety   . Depression   . Migraines   . Post partum depression     Past Surgical History:  Procedure Laterality Date  . CHOLECYSTECTOMY    . FOOT SURGERY Left   . KNEE SURGERY Left     Family History  Problem Relation Age of Onset  . Migraines Mother   . Cancer Maternal Grandmother   . Cancer Maternal Grandfather   . Hypertension Paternal Grandfather   . Diabetes Maternal Aunt   . Diabetes Maternal Uncle   . Diabetes Maternal Aunt     Social History   Socioeconomic History  . Marital status: Married    Spouse name: Marjory Lies  . Number of children: Not on file  . Years of education: Not on file  . Highest education level: Not on file  Occupational History  . Occupation: Patent examiner  . Financial resource strain: Not on file  . Food insecurity    Worry: Not on file    Inability: Not on file  . Transportation needs    Medical: Not on file    Non-medical: Not on file  Tobacco Use  . Smoking status: Never Smoker  . Smokeless tobacco: Never Used  Substance and Sexual Activity  . Alcohol use: No    Comment: occasional, while not pregnant  . Drug use: No  . Sexual activity: Yes    Birth control/protection: None  Lifestyle  . Physical activity    Days per week: Not on file    Minutes per session: Not on file  . Stress: Not on file  Relationships  . Social Musicianconnections    Talks on phone: Not on file    Gets together: Not on file    Attends religious service: Not on file    Active member of club or organization: Not on file    Attends meetings of clubs or organizations: Not on file    Relationship status: Not on file  . Intimate partner violence    Fear of current or ex partner: Not on file    Emotionally abused: Not on file    Physically abused: Not on file    Forced sexual activity: Not on file  Other Topics Concern  . Not on file  Social History Narrative  . Not on file    Allergies  Allergen Reactions  . Robitussin (Alcohol Free)  [Guaifenesin] Itching    Took Benadryl, helped    Review of Systems - Negative except as per HPI  Exam Vitals:   06/29/19 2130 06/29/19 2145  BP: 110/78 109/71  Pulse: (!) 101 99  Resp: (!) 25 (!) 25  Temp:    SpO2: 96% 99%     Physical Examination: General appearance - alert, NAD Neck - thyroid midline Chest - normal effort Heart - tachy Abdomen - gravid, non-tender Extremities - Homan's sign negative bilaterally Skin - normal coloration and turgor, no rashes, no suspicious skin lesions noted FHR- Category 1  Labs:  CBC    Component Value Date/Time   WBC 10.1 06/29/2019 2206   RBC 4.23 06/29/2019 2206   HGB 10.9 (L) 06/29/2019 2206   HGB 12.3 05/10/2019 1012   HGB 13.6 10/03/2015   HCT 34.4 (L) 06/29/2019 2206   HCT 37.0 05/10/2019 1012   HCT 40 10/03/2015   PLT 407 (H) 06/29/2019 2206   PLT 394 05/10/2019 1012   PLT 376 10/03/2015   MCV 81.3 06/29/2019 2206   MCV 83 05/10/2019 1012   MCV 89 11/01/2014 0655   MCH 25.8 (L) 06/29/2019 2206   MCHC 31.7 06/29/2019 2206   RDW 14.0 06/29/2019 2206   RDW 12.3 05/10/2019 1012   RDW 12.6 11/01/2014 0655   LYMPHSABS 1.9 06/29/2019 2206   LYMPHSABS 3.1 11/01/2014 0655   MONOABS 0.8 06/29/2019 2206   MONOABS 0.6 11/01/2014 0655   EOSABS 0.0 06/29/2019 2206   EOSABS 0.2 11/01/2014 0655   BASOSABS 0.0 06/29/2019 2206   BASOSABS 0.0 11/01/2014 0655     Lab Results  Component Value Date   TSH 1.600 01/15/2019    No components found for: URINALYSIS  Lab Results  Component Value Date   PREGTESTUR Positive (A) 12/12/2018    Rapid strep negative  Radiological Studies Dg Chest Portable 1 View  Result Date: 06/29/2019 CLINICAL DATA:  Cough and fever. EXAM: PORTABLE CHEST 1 VIEW COMPARISON:  None. FINDINGS: The heart size and mediastinal contours are within normal limits. Both lungs are clear. The visualized skeletal structures are unremarkable. IMPRESSION: No active disease. Electronically Signed   By: Gerome Samavid   Williams III M.D   On: 06/29/2019 21:34   U/S on 06/28/2019 Fetal presentation is Cephalic.  Placenta: anterior. Grade: 2 AFI: 9.4 cm Growth percentile is 60%ile EFW: 3018 g (6 lbs 10 oz)  Thank you so much for allowing us to participate in the care of this patient.  We will continue to follow with you. Please call the attending OB/GYN physician with questions or concerns at [phone].

## 2019-06-29 NOTE — ED Notes (Signed)
Rapid OB assessed and cleared the patient.

## 2019-06-29 NOTE — ED Provider Notes (Signed)
[facility] EM[facility]ENC[facility] Provider Note   CSN: [id number] Arrival date & time: 06/29/19  1658    History   Chief Complaint Chief Complaint  Patient presents with  . Cough  . Back Pain    HPI Shannon [name] is a 28 y.o. female with h/o migraines, currently in third trimester presents to ER for evaluation of headache. Onset Wednesday. Described as constant, frontal between the eyes, persistent. Reports h/o of similar headaches in the past related to sinus issues and allergies. Initially thought it was due to this however since Wednesday she has developed fever 99-101, rhinorrhea, congestion, sore throat, productive cough with thick yellow sputum, nausea that she attributes to the head pain.  Last night she woke up feeling hot and had a fever, lower abdominal pain and "Braxton hicks" contractions which have since resolved.  Today at around 2pm woke up and had a fever again and noticed decreased fetal movement.  She was seen by PCP and had COVID test done yesterday. No alleviating factors. No modifying factors. No other interventions. States she had a family party last weekend and now her mother has sinus infection and her sister who lives with her has a sore throat.   Denies head trauma, neck rigidity or stiffness or pain, vision changes or loss, dizziness, vomiting, CP, SOB, abdominal or pelvic pain, vaginal bleeding or fluid leaking, dysuria, hematuria, diarrhea.     HPI  Past Medical History:  Diagnosis Date  . Anxiety   . Depression   . Migraines   . Post partum depression     Patient Active Problem List   Diagnosis Date Noted  . Suspected Covid-19 Virus Infection 06/29/2019  . Hypoxia 06/29/2019  . Pregnancy and infectious disease in third trimester 06/29/2019  . SOB (shortness of breath) 06/29/2019  . Prediabetes 05/10/2019  . Antenatal screening for HIV declined 05/10/2019  . History of preterm labor 05/10/2019  . Abnormal biochemical finding on  antenatal screening of mother   . History of miscarriage 01/15/2019  . Contusion of left hand 03/07/2018  . Pain of left hand 03/07/2018  . Major depression 07/27/2017  . Pelvic pain 07/27/2017  . Obesity (BMI 30.0-34.9) 07/27/2017  . BMI 33.0-33.9,adult 05/20/2016  . Migraine without aura and without status migrainosus, not intractable 09/26/2014    Past Surgical History:  Procedure Laterality Date  . CHOLECYSTECTOMY    . FOOT SURGERY Left   . KNEE SURGERY Left      OB History    Gravida  3   Para  1   Term  1   Preterm  0   AB  1   Living  1     SAB  1   TAB  0   Ectopic  0   Multiple  0   Live Births  1            Home Medications    Prior to Admission medications   Medication Sig Start Date End Date Taking? Authorizing Provider  Prenatal Vit-Fe Fumarate-FA (PRENATAL MULTIVITAMIN) TABS tablet Take 1 tablet by mouth daily at 12 noon.   Yes [provider]  cyclobenzaprine (FLEXERIL) 10 MG tablet Take 1 tablet (10 mg total) by mouth 3 (three) times daily as needed for muscle spasms. Patient not taking: Reported on 06/29/2019 06/12/19   Gunnar BullaLawhorn, Jenkins Michelle, CNM    Family History Family History  Problem Relation Age of Onset  . Migraines Mother   . Cancer Maternal Grandmother   .  Cancer Maternal Grandfather   . Hypertension Paternal Grandfather   . Diabetes Maternal Aunt   . Diabetes Maternal Uncle   . Diabetes Maternal Aunt     Social History Social History   Tobacco Use  . Smoking status: Never Smoker  . Smokeless tobacco: Never Used  Substance Use Topics  . Alcohol use: No    Comment: occasional, while not pregnant  . Drug use: No     Allergies   Robitussin (alcohol free) [guaifenesin]   Review of Systems Review of Systems  Constitutional: Positive for fatigue and fever.  HENT: Positive for congestion, postnasal drip and sore throat.   Respiratory: Positive for cough.   Gastrointestinal: Positive for nausea.   Genitourinary: Positive for pelvic pain (cramping, resolved).  All other systems reviewed and are negative.    Physical Exam Updated Vital Signs BP 111/72   Pulse 95   Temp 99.6 F (37.6 C) (Rectal)   Resp (!) 23   LMP 10/20/2018 (Exact Date)   SpO2 99%   Physical Exam Vitals signs and nursing note reviewed.  Constitutional:      Appearance: She is well-developed.     Comments: Non toxic in NAD. Sounds congested   HENT:     Head: Normocephalic and atraumatic.     Nose: Congestion and rhinorrhea present.     Mouth/Throat:     Pharynx: Posterior oropharyngeal erythema present.     Comments: Mild oropharyngeal erythema. Tonsils mildly erythematous but no edema, , exudates. Uvula midline. Tolerating secretions. No trismus. Normal SL space. Normal protrusion of tongue. MMM Eyes:     Conjunctiva/sclera: Conjunctivae normal.  Neck:     Musculoskeletal: Normal range of motion.     Comments: Mildly tender submandibular LAD. No anterior neck edema.  Cardiovascular:     Rate and Rhythm: Normal rate and regular rhythm.  Pulmonary:     Effort: Pulmonary effort is normal.     Breath sounds: Normal breath sounds.  Abdominal:     General: Bowel sounds are normal.     Palpations: Abdomen is soft.     Tenderness: There is no abdominal tenderness.     Comments: Gravid abdomen, soft, NT   Musculoskeletal: Normal range of motion.  Lymphadenopathy:     Cervical: Cervical adenopathy present.  Skin:    General: Skin is warm and dry.     Capillary Refill: Capillary refill takes less than 2 seconds.  Neurological:     Mental Status: She is alert.  Psychiatric:        Behavior: Behavior normal.      ED Treatments / Results  Labs (all labs ordered are listed, but only abnormal results are displayed) Labs Reviewed  SARS CORONAVIRUS 2 (HOSPITAL ORDER, PERFORMED IN [facility]) - Abnormal; Notable for the following components:      Result Value   SARS Coronavirus 2  POSITIVE (*)    All other components within normal limits  CBC WITH DIFFERENTIAL/PLATELET - Abnormal; Notable for the following components:   Hemoglobin 10.9 (*)    HCT 34.4 (*)    MCH 25.8 (*)    Platelets 407 (*)    All other components within normal limits  BASIC METABOLIC PANEL - Abnormal; Notable for the following components:   CO2 17 (*)    BUN 5 (*)    Calcium 8.4 (*)    All other components within normal limits  GROUP A STREP BY PCR  RESPIRATORY PANEL BY PCR  LACTIC ACID,  PLASMA  RPR  RAPID HIV SCREEN (HIV 1/2 AB+AG)  C-REACTIVE PROTEIN  D-DIMER, QUANTITATIVE (NOT AT [facility])  FERRITIN  SEDIMENTATION RATE  BASIC METABOLIC PANEL  CBC  IRON AND TIBC  TYPE AND SCREEN  ABO/RH    EKG EKG Interpretation  Date/Time:  Friday June 29 2019 22:14:46 EDT Ventricular Rate:  101 PR Interval:    QRS Duration: 91 QT Interval:  347 QTC Calculation: 450 R Axis:   74 Text Interpretation:  Sinus tachycardia Baseline wander in lead(s) V1 V3 V4 Otherwise within normal limits When compared with ECG of 06/17/2014, HEART RATE has increased Confirmed by Delora Fuel ([id number]) on 06/29/2019 11:47:57 PM   Radiology Dg Chest Portable 1 View  Result Date: 06/29/2019 CLINICAL DATA:  Cough and fever. EXAM: PORTABLE CHEST 1 VIEW COMPARISON:  None. FINDINGS: The heart size and mediastinal contours are within normal limits. Both lungs are clear. The visualized skeletal structures are unremarkable. IMPRESSION: No active disease. Electronically Signed   By: Dorise Bullion III M.D   On: 06/29/2019 21:34    Procedures .Critical Care Performed by: Kinnie Feil, PA-C Authorized by: Kinnie Feil, PA-C   Critical care provider statement:    Critical care time (minutes):  45   Critical care was necessary to treat or prevent imminent or life-threatening deterioration of the following conditions:  Respiratory failure   Critical care was time spent personally by me on the following activities:   Discussions with consultants, evaluation of patient's response to treatment, examination of patient, ordering and performing treatments and interventions, ordering and review of laboratory studies, ordering and review of radiographic studies, pulse oximetry, re-evaluation of patient's condition, obtaining history from patient or surrogate, review of old charts and development of treatment plan with patient or surrogate   I assumed direction of critical care for this patient from another provider in my specialty: no     (including critical care time)  Medications Ordered in ED Medications  acetaminophen (TYLENOL) tablet 650 mg (has no administration in time range)  zolpidem (AMBIEN) tablet 5 mg (has no administration in time range)  docusate sodium (COLACE) capsule 100 mg (has no administration in time range)  calcium carbonate (TUMS - dosed in mg elemental calcium) chewable tablet 400 mg of elemental calcium (has no administration in time range)  prenatal multivitamin tablet 1 tablet (has no administration in time range)  enoxaparin (LOVENOX) injection 40 mg (has no administration in time range)  metoCLOPramide (REGLAN) injection 10 mg (10 mg Intravenous Given 06/29/19 2035)  acetaminophen (TYLENOL) tablet 1,000 mg (1,000 mg Oral Given 06/29/19 2034)  pseudoephedrine (SUDAFED) tablet 30 mg (30 mg Oral Given 06/29/19 2159)  oxymetazoline (AFRIN) 0.05 % nasal spray 1 spray (1 spray Each Nare Given 06/29/19 2032)  sodium chloride 0.9 % bolus 500 mL (0 mLs Intravenous Stopped 06/29/19 2033)     Initial Impression / Assessment and Plan / ED Course  I have reviewed the triage vital signs and the nursing notes.  Pertinent labs & imaging results that were available during my care of the patient were reviewed by me and considered in my medical decision making (see chart for details).  Clinical Course as of Jun 28 2350  Fri Jun 29, 2019  1912 OB RN seen patient. Dr Kennon Rounds [facility] attending [phone].    [CG]  2049  >94% goal    [CG]  2050 I have spoken to Dr Kennon Rounds with OBGYN, agrees with current ER tx and plan for CXR and strep,  observe, IVF. If pt desturates < 94% [facility] and MAU consulting, repage them if admitting.    [CG]  2144 [facility] FellersFrank pending labs, will confirm admitting policies    [CG]  2335 SARS Coronavirus 2(!): POSITIVE [CG]    Clinical Course User Index [CG] Liberty HandyGibbons,  J, PA-C   ddx includes URI vs LRI including COVID, PNA. Recent family party last weekend now other family members have URI symptoms.   CXR unremarkable. Strep negative.   Pt desaturated in ER to 88-89% on RA with symptoms of SOB, light headedness. She has been reporting decreased fetal movement.   Will obtain labs, COVID swab. Anticipate admission.  2340: COVID positive. Labs otherwise unremarkable. Discussed pt with Dr Theodoro ParmaPratt OBGYN who agrees with admission given hypoxemia, OBGYN to be consulted once on the floor for evaluation. Discussed with FM resident Dr Homero FellersFrank who has accepted pt. Pt updated. Shared visit with EDP.   Shannon [name] was evaluated in Emergency Department on 06/29/2019 for the symptoms described in the history of present illness. She was evaluated in the context of the global COVID-19 pandemic, which necessitated consideration that the patient might be at risk for infection with the SARS-CoV-2 virus that causes COVID-19. Institutional protocols and algorithms that pertain to the evaluation of patients at risk for COVID-19 are in a state of rapid change based on information released by regulatory bodies including the CDC and federal and state organizations. These policies and algorithms were followed during the patient's care in the ED. Final Clinical Impressions(s) / ED Diagnoses   Final diagnoses:  Hypoxemia  Suspected Covid-19 Virus Infection    ED Discharge Orders    None       Liberty HandyGibbons,  J, PA-C 06/29/19 2351    Little,  Ambrose Finlandachel Morgan, MD 06/30/19 [phone]

## 2019-06-29 NOTE — H&P (Addendum)
[facility] History and Physical Service Pager: [phone]  Patient name: Shannon [name] Medical record number: [id number] Date of birth: [birth year] Age: 28 y.o. Gender: female  Primary Care Provider: Patient, No Pcp Per Consultants: OBGYN Code Status: FULL  Preferred Emergency Contact:   Chief Complaint: Dyspnea   Assessment and Plan: Shannon [name] is a 28 y.o. female presenting with a 3 day history of malaise, sore throat, headache, dyspnea and productive cough. PMH is significant for prediabetes and major depression.  Acute hypoxic respiratory failure secondary to suspected COVID  Patient presented with malaise, sore throat, headache, dyspnea and productive cough. Had been exposed to potentially COVID positive family members at a family gathering who have had tests for COVID and awaiting  results. CXR was clear showing no evidence of pneumonia and pneumothorax. Awaiting D-dimer to rule out PE although may be elevated as patient is pregnant. Labs show Hb 10.9 and no leukocytosis. EKG was normal. No other evidence to indicate cardiac cause of dyspnea ie HF, cardiac tamponade, pericarditis. Obstetric review indicated good fetal movement, no contractions, leaking fluid of bleeding. Unlikely to be secondary to obstetric cause of dyspnea but something to consider as differential if COVID negative and symptoms not improving.  -Admit to med-surg floors -Order respiratory panel -Respiratory, droplet precautions -Continue nasal cannula, wean as tolerated -Continuous pulse ox -Continue close observations of vital signs  -monitor resp status, early consult to ICU for decompensation -Follow up COVID test -If COVID test negative, explore other diagnosis: PE, CAP, cardiac causes (cardiomyopathy, percarditis) and obstetric causes of her dyspnea  -Consider Echo if COVID negative -Tylenol PRN  Anemia, normocytic Last hemoglobin 12.3 (05/10/2019).  Hemoglobin  on admission 10.9.  This is, in part, secondary to pregnancy although we will assess for further contributions to her anemia.  Is likely contributing to her above noted respiratory issue. -Hb 10.9-consider investigation of causes of anemia ie Iron, ferritin and TIBC   Pregnancy, uncomplicated, third trimester 36w G3P1011. Previous pregnancy resulted in preterm labour, treated medically for this and delivered at term, second pregnancy resulted in a spontaneous abortion. [facility].  Her pregnancy thus far has been uncomplicated.  On admission, she noted less activity of the fetus compared to usual although she continues to report fetal movement at least 6 times per hour. -OB following, appreciate recommendations -Daily NST -History of preterm labour  Major depression. History of post partum depression Not currently endorsing any signs or symptoms of depression.  Not currently taking any antidepressive medications. -Continue to monitor  Prediabetes-Well controlled  Glucose today 93. Last HbA1C 01/15/19: 5.7.  -Monitor CBGs  Obesity In part secondary to third trimester pregnancy.  Unlikely contributing to primary problem of hypoxic respiratory failure.  Obesity hypoventilation syndrome is not likely contributing.  FEN/GI: General diet  Prophylaxis: Lovenox  Disposition: 1-2 days of hospitalization anticipated   History of Present Illness:  Shannon [name] is a 28 y.o. female presenting with a 2-3 day history of general malaise, headache, sore throat initially followed by upper airway congestion, productive cough (clear sputum) and dyspnea. Associated symptoms include chill, fevers and central chest pain only on coughing. No pleuritic chest pain and non radiating. She has also had lower back pan and abdominal pain when coughing. She had been to a family gathering during the last week, several of these family members have had similar  symptoms to her and have been tested for  COVID. COVID tests are pending for the patient and family members. Patient was concerned that she also had reduced fetal movements although she continues to note fetal movement at least 6 times per hour.  During the day of 7/3 she reports significant fatigue and had difficulty getting out of bed during the day.  She contacted the midwife today regarding her symptoms who suggested she was admitted to the ED for further investigation of her symptoms.  In ED her saturations were initially 92% on air, however after ambulating her, her saturations dropped to 89% on air. CXR was unremarkable. Her saturations came up to 100% on 2L oxygen. OBGYN consulted her who advised an admission for investigation and treatment of acute hypoxia.   Review Of Systems: Per HPI with the following additions:   Review of Systems  Constitutional: Positive for chills, fever (to 101) and malaise/fatigue.  HENT: Positive for congestion and sore throat.   Eyes: Negative for blurred vision.  Respiratory: Positive for cough and sputum production (clear). Negative for shortness of breath.   Cardiovascular: Positive for chest pain (aching with coughing). Negative for palpitations, orthopnea and leg swelling.  Gastrointestinal: Negative for abdominal pain, blood in stool, constipation, diarrhea and vomiting.  Genitourinary: Negative for dysuria and frequency.  Musculoskeletal: Positive for joint pain (bone pain in her fingers/hands). Negative for myalgias.  Skin: Negative for rash.  Neurological: Positive for headaches. Negative for tremors, focal weakness and weakness.    Patient Active Problem List   Diagnosis Date Noted  . Suspected Covid-19 Virus Infection 06/29/2019  . Hypoxia 06/29/2019  . Pregnancy and infectious disease in third trimester 06/29/2019  . Prediabetes 05/10/2019  . Antenatal screening for HIV declined 05/10/2019  . History of preterm labor 05/10/2019  . Abnormal  biochemical finding on antenatal screening of mother   . History of miscarriage 01/15/2019  . Contusion of left hand 03/07/2018  . Pain of left hand 03/07/2018  . Major depression 07/27/2017  . Pelvic pain 07/27/2017  . Obesity (BMI 30.0-34.9) 07/27/2017  . BMI 33.0-33.9,adult 05/20/2016  . Migraine without aura and without status migrainosus, not intractable 09/26/2014    Past Medical History: Past Medical History:  Diagnosis Date  . Anxiety   . Depression   . Migraines   . Post partum depression     Past Surgical History: Past Surgical History:  Procedure Laterality Date  . CHOLECYSTECTOMY    . FOOT SURGERY Left   . KNEE SURGERY Left     Social History: Social History   Tobacco Use  . Smoking status: Never Smoker  . Smokeless tobacco: Never Used  Substance Use Topics  . Alcohol use: No    Comment: occasional, while not pregnant  . Drug use: No   Additional social history: Lives with husband and daughter.  Daughter is recently taken to live with relatives wife is concerned about Ms. Rigel having COVID. Please also refer to relevant sections of EMR.  Family History: Family History  Problem Relation Age of Onset  . Migraines Mother   . Cancer Maternal Grandmother   . Cancer Maternal Grandfather   . Hypertension Paternal Grandfather   . Diabetes Maternal Aunt   . Diabetes Maternal Uncle   . Diabetes Maternal Aunt     Allergies and Medications: Allergies  Allergen Reactions  . Robitussin (Alcohol Free) [Guaifenesin] Itching    Took Benadryl, helped   No current facility-administered medications on file prior to encounter.    Current Outpatient Medications on File Prior to  Encounter  Medication Sig Dispense Refill  . cyclobenzaprine (FLEXERIL) 10 MG tablet Take 1 tablet (10 mg total) by mouth 3 (three) times daily as needed for muscle spasms. 20 tablet 0  . Prenatal Vit-Fe Fumarate-FA (PRENATAL MULTIVITAMIN) TABS tablet Take 1 tablet by mouth daily at  12 noon.      Objective: BP 109/71   Pulse 99   Temp 99.1 F (37.3 C) (Oral)   Resp (!) 25   LMP 10/20/2018 (Exact Date)   SpO2 99%  Exam: Physical Exam Constitutional:      General: She is not in acute distress.    Appearance: Normal appearance. She is obese. She is not ill-appearing or toxic-appearing.  HENT:     Nose: Nose normal.     Mouth/Throat:     Mouth: Mucous membranes are moist.     Pharynx: Oropharynx is clear.  Eyes:     General:        Right eye: No discharge.        Left eye: No discharge.     Conjunctiva/sclera: Conjunctivae normal.     Pupils: Pupils are equal, round, and reactive to light.  Neck:     Musculoskeletal: Normal range of motion and neck supple.  Cardiovascular:     Rate and Rhythm: Regular rhythm. Tachycardia present.     Pulses: Normal pulses.     Heart sounds: Normal heart sounds. No murmur.  Pulmonary:     Effort: Pulmonary effort is normal.     Breath sounds: Normal breath sounds.     Comments: Good air movement in all quadrants without rales or rhonchi.  Breathing comfortably on 2 L nasal cannula. Abdominal:     General: Bowel sounds are normal.  Skin:    General: Skin is warm and dry.     Capillary Refill: Capillary refill takes less than 2 seconds.     Coloration: Skin is not jaundiced.  Neurological:     General: No focal deficit present.     Mental Status: She is alert and oriented to person, place, and time. Mental status is at baseline.  Psychiatric:        Mood and Affect: Mood normal.        Behavior: Behavior normal.      Labs and Imaging: CBC BMET  No results for input(s): WBC, HGB, HCT, PLT in the last 168 hours. No results for input(s): NA, K, CL, CO2, BUN, CREATININE, GLUCOSE, CALCIUM in the last 168 hours.   EKG: sinus tachycardia  Lattie Haw, MD 06/29/2019, 10:26 PM PGY-1, [place] Intern pager: [phone], text pages welcome  FPTS Upper-Level Resident Addendum   I have  independently interviewed and examined the patient. I have discussed the above with the original author and agree with their documentation. My edits for correction/addition/clarification are in blue. Please see also any attending notes.    Matilde Haymaker MD PGY-1, [place] [facility] 06/30/2019 4:56 AM  FPTS Service pager: [phone] (text pages welcome through [facility])

## 2019-06-29 NOTE — ED Notes (Signed)
Called [facility] nurse, Limmie Patricia and she said she is going to speak w/her attending and call the RN back.  She recommended we check fetal heart tones and document.

## 2019-06-30 ENCOUNTER — Encounter (HOSPITAL_COMMUNITY): Payer: Self-pay

## 2019-06-30 DIAGNOSIS — J208 Acute bronchitis due to other specified organisms: Secondary | ICD-10-CM | POA: Diagnosis present

## 2019-06-30 DIAGNOSIS — O98513 Other viral diseases complicating pregnancy, third trimester: Principal | ICD-10-CM

## 2019-06-30 DIAGNOSIS — R0682 Tachypnea, not elsewhere classified: Secondary | ICD-10-CM | POA: Diagnosis present

## 2019-06-30 DIAGNOSIS — Z3A36 36 weeks gestation of pregnancy: Secondary | ICD-10-CM

## 2019-06-30 DIAGNOSIS — R0902 Hypoxemia: Secondary | ICD-10-CM

## 2019-06-30 DIAGNOSIS — U071 COVID-19: Secondary | ICD-10-CM

## 2019-06-30 DIAGNOSIS — O98913 Unspecified maternal infectious and parasitic disease complicating pregnancy, third trimester: Secondary | ICD-10-CM

## 2019-06-30 DIAGNOSIS — Z9981 Dependence on supplemental oxygen: Secondary | ICD-10-CM

## 2019-06-30 LAB — IRON AND TIBC
Iron: 33 ug/dL (ref 28–170)
Saturation Ratios: 5 % — ABNORMAL LOW (ref 10.4–31.8)
TIBC: 630 ug/dL — ABNORMAL HIGH (ref 250–450)
UIBC: 597 ug/dL

## 2019-06-30 LAB — CBC
HCT: 34.7 % — ABNORMAL LOW (ref 36.0–46.0)
Hemoglobin: 11.1 g/dL — ABNORMAL LOW (ref 12.0–15.0)
MCH: 26.1 pg (ref 26.0–34.0)
MCHC: 32 g/dL (ref 30.0–36.0)
MCV: 81.5 fL (ref 80.0–100.0)
Platelets: 409 10*3/uL — ABNORMAL HIGH (ref 150–400)
RBC: 4.26 MIL/uL (ref 3.87–5.11)
RDW: 14.3 % (ref 11.5–15.5)
WBC: 9.7 10*3/uL (ref 4.0–10.5)
nRBC: 0 % (ref 0.0–0.2)

## 2019-06-30 LAB — BASIC METABOLIC PANEL
Anion gap: 11 (ref 5–15)
BUN: <5 mg/dL — ABNORMAL LOW (ref 6–20)
CO2: 20 mmol/L — ABNORMAL LOW (ref 22–32)
Calcium: 8.5 mg/dL — ABNORMAL LOW (ref 8.9–10.3)
Chloride: 105 mmol/L (ref 98–111)
Creatinine, Ser: 0.54 mg/dL (ref 0.44–1.00)
GFR calc Af Amer: >60 mL/min (ref >60)
GFR calc non Af Amer: >60 mL/min (ref >60)
Glucose, Bld: 83 mg/dL (ref 70–99)
Potassium: 3.4 mmol/L — ABNORMAL LOW (ref 3.5–5.1)
Sodium: 136 mmol/L (ref 135–145)

## 2019-06-30 LAB — C-REACTIVE PROTEIN: CRP: 6.7 mg/dL — ABNORMAL HIGH (ref <1.0)

## 2019-06-30 LAB — SEDIMENTATION RATE: Sed Rate: 34 mm/hr — ABNORMAL HIGH (ref 0–22)

## 2019-06-30 LAB — ABO/RH: ABO/RH(D): O POS

## 2019-06-30 LAB — RAPID HIV SCREEN (HIV 1/2 AB+AG)
HIV 1/2 Antibodies: NONREACTIVE
HIV-1 P24 Antigen - HIV24: NONREACTIVE

## 2019-06-30 LAB — D-DIMER, QUANTITATIVE: D-Dimer, Quant: 3.07 ug/mL-FEU — ABNORMAL HIGH (ref 0.00–0.50)

## 2019-06-30 LAB — TYPE AND SCREEN
ABO/RH(D): O POS
Antibody Screen: NEGATIVE

## 2019-06-30 LAB — FERRITIN: Ferritin: 10 ng/mL — ABNORMAL LOW (ref 11–307)

## 2019-06-30 MED ORDER — FERROUS SULFATE 325 (65 FE) MG PO TABS
325.0000 mg | ORAL_TABLET | Freq: Every day | ORAL | Status: DC
  Administered 2019-06-30 – 2019-07-02 (×3): 325 mg via ORAL
  Filled 2019-06-30 (×3): qty 1

## 2019-06-30 NOTE — ED Notes (Signed)
Spoke with pt via telephone and she advised everything is [place] right now and she does not need anything. Will use call bell if anything is needed.

## 2019-06-30 NOTE — ED Notes (Signed)
Contacted bed placement and they advised the pt would be getting a bed on 2W in approximately 30 minutes.

## 2019-06-30 NOTE — Progress Notes (Signed)
[facility] ENCOURAGED PT TO STAY HYDRATED, DRINK PLENTY OF WATER; PT SAYS SHE HASN'T BEEN DRINKING MUCH LATELY, BUT HAS RECENTLY BEEN TRYING TO INCREASE HYDRATION; [facility] TOLD PT TO LET HER RN KNOW IF SHE HAS ANY CONCERNS AND THEY CAN CALL [facility]-[phone]  AND OB ATTENDING=[phone]

## 2019-06-30 NOTE — Plan of Care (Signed)
  Problem: Education: reinforced importance of relaying changes in fetal movement, level of wellness for prompt intervention Goal: Knowledge of General Education information will improve Description: Including pain rating scale, medication(s)/side effects and non-pharmacologic comfort measures Outcome: Progressing   Problem: Clinical Measurements: continue to monitor vital signs and clinical labs Goal: Will remain free from infection Outcome: Progressing

## 2019-06-30 NOTE — Progress Notes (Addendum)
FACULTY PRACTICE ANTEPARTUM COMPREHENSIVE PROGRESS NOTE  Shannon [name] is a 28 y.o. G3P1011 at 6618w1d who is admitted for COVID infection with associated hypoxia.  Estimated Date of Delivery: 07/27/19 Fetal presentation is cephalic.  Length of Stay:  1 Days. Admitted 06/29/2019  Subjective: Feels that she is breathing much easier since on oxygen, currently on 2L [place]. No chest pain or other complaints.  Patient reports good fetal movement.  She reports no uterine contractions, no bleeding and no loss of fluid per vagina.  Vitals:  Blood pressure 116/73, pulse 96, temperature (!) 97.4 F (36.3 C), temperature source Oral, resp. rate 20, height 5\' 3"  (1.6 m), weight 99 kg, last menstrual period 10/20/2018, SpO2 100 %. Physical Examination: Neck - thyroid midline Chest - normal effort Heart - tachy Abdomen - gravid, non-tender Extremities - Homan's sign negative bilaterally Skin - normal coloration and turgor, no rashes, no suspicious skin lesions noted FHR- Category 1  Labs Results for orders placed or performed during the hospital encounter of 06/29/19 (from the past 48 hour(s))  Group A Strep by PCR     Status: None   Collection Time: 06/29/19  8:48 PM   Specimen: Throat; Sterile Swab  Result Value Ref Range   Group A Strep by PCR NOT DETECTED NOT DETECTED    Comment: Performed at [facility], [address] [place], [place] [address]  CBC with Differential     Status: Abnormal   Collection Time: 06/29/19 10:06 PM  Result Value Ref Range   WBC 10.1 4.0 - 10.5 K/uL   RBC 4.23 3.87 - 5.11 MIL/uL   Hemoglobin 10.9 (L) 12.0 - 15.0 g/dL   HCT 60.434.4 (L) 54.036.0 - 98.146.0 %   MCV 81.3 80.0 - 100.0 fL   MCH 25.8 (L) 26.0 - 34.0 pg   MCHC 31.7 30.0 - 36.0 g/dL   RDW 19.114.0 47.811.5 - 29.515.5 %   Platelets 407 (H) 150 - 400 K/uL   nRBC 0.0 0.0 - 0.2 %   Neutrophils Relative % 73 %   Neutro Abs 7.3 1.7 - 7.7 K/uL   Lymphocytes Relative 19 %   Lymphs Abs 1.9 0.7 - 4.0 K/uL   Monocytes  Relative 8 %   Monocytes Absolute 0.8 0.1 - 1.0 K/uL   Eosinophils Relative 0 %   Eosinophils Absolute 0.0 0.0 - 0.5 K/uL   Basophils Relative 0 %   Basophils Absolute 0.0 0.0 - 0.1 K/uL   Immature Granulocytes 0 %   Abs Immature Granulocytes 0.04 0.00 - 0.07 K/uL    Comment: Performed at [facility], [address] [place], [place] [address]  Basic metabolic panel     Status: Abnormal   Collection Time: 06/29/19 10:06 PM  Result Value Ref Range   Sodium 135 135 - 145 mmol/L   Potassium 3.6 3.5 - 5.1 mmol/L   Chloride 106 98 - 111 mmol/L   CO2 17 (L) 22 - 32 mmol/L   Glucose, Bld 93 70 - 99 mg/dL   BUN 5 (L) 6 - 20 mg/dL   Creatinine, Ser 8.650.47 0.44 - 1.00 mg/dL   Calcium 8.4 (L) 8.9 - 10.3 mg/dL   GFR calc non Af Amer >60 >60 mL/min   GFR calc Af Amer >60 >60 mL/min   Anion gap 12 5 - 15    Comment: Performed at [facility], [address] [place], [place] [address]  Lactic acid, plasma     Status:  None   Collection Time: 06/29/19 10:06 PM  Result Value Ref Range   Lactic Acid, Venous 0.7 0.5 - 1.9 mmol/L    Comment: Performed at [facility], [address] [place], [place] [address]  SARS Coronavirus 2 (CEPHEID - Performed in [facility] lab), Hosp Order     Status: Abnormal   Collection Time: 06/29/19 10:14 PM   Specimen: Nasopharyngeal Swab  Result Value Ref Range   SARS Coronavirus 2 POSITIVE (A) NEGATIVE    Comment: RESULT CALLED TO, READ BACK BY AND VERIFIED WITH: T Swazil[place]JORDAN RN 06/29/19 2320 JDW (NOTE) If result is NEGATIVE SARS-CoV-2 target nucleic acids are NOT DETECTED. The SARS-CoV-2 RNA is generally detectable in upper and lower  respiratory specimens during the acute phase of infection. The lowest  concentration of SARS-CoV-2 viral copies this assay can detect is 250  copies / mL. A negative result does not preclude SARS-CoV-2 infection  and should not be used as the sole basis for treatment or other  patient management decisions.   A negative result may occur with  improper specimen collection / handling, submission of specimen other  than nasopharyngeal swab, presence of viral mutation(s) within the  areas targeted by this assay, and inadequate number of viral copies  (<250 copies / mL). A negative result must be combined with clinical  observations, patient history, and epidemiological information. If result is POSITIVE SARS-CoV-2 target nucleic acids are DETECTED. The SARS- CoV-2 RNA is generally detectable in upper and lower  respiratory specimens during the acute phase of infection.  Positive  results are indicative of active infection with SARS-CoV-2.  Clinical  correlation with patient history and other diagnostic information is  necessary to determine patient infection status.  Positive results do  not rule out bacterial infection or co-infection with other viruses. If result is PRESUMPTIVE POSTIVE SARS-CoV-2 nucleic acids MAY BE PRESENT.   A presumptive positive result was obtained on the submitted specimen  and confirmed on repeat testing.  While 2019 novel coronavirus  (SARS-CoV-2) nucleic acids may be present in the submitted sample  additional confirmatory testing may be necessary for epidemiological  and / or clinical management purposes  to differentiate between  SARS-CoV-2 and other Sarbecovirus currently known to infect humans.  If clinically indicated additional testing with an alternate test  methodology [phone]([id number]) is advised . The SARS-CoV-2 RNA is generally  detectable in upper and lower respiratory specimens during the acute  phase of infection. The expected result is Negative. Fact Sheet for Patients:  BoilerBrush.com.cyhttps://www.fda.gov/media/136312/download Fact Sheet for Healthcare Providers: https://pope.com/https://www.fda.gov/media/136313/download This test is not yet approved or cleared by the [place] FDA and has been authorized for detection and/or diagnosis of SARS-CoV-2 by FDA under an Emergency Use  Authorization (EUA).  This EUA will remain in effect (meaning this test can be used) for the duration of the COVID-19 declaration under Section 564(b)(1) of the Act, 21 U.S.C. section 360bbb-3(b)(1), unless the authorization is terminated or revoked sooner. Performed at [facility], [address] [place], [place] [address]   Type and screen [facility]     Status: None   Collection Time: 06/29/19 11:05 PM  Result Value Ref Range   ABO/RH(D) O POS    Antibody Screen NEG    Sample Expiration      07/02/2019,2359 Performed at [facility], [address] [place], [place] [address]   Rapid HIV screen (HIV 1/2 Ab+Ag)     Status:  None   Collection Time: 06/29/19 11:05 PM  Result Value Ref Range   HIV-1 P24 Antigen - HIV24 NON REACTIVE NON REACTIVE   HIV 1/2 Antibodies NON REACTIVE NON REACTIVE   Interpretation (HIV Ag Ab)      A non reactive test result means that HIV 1 or HIV 2 antibodies and HIV 1 p24 antigen were not detected in the specimen.    Comment: Performed at [facility], [place] [address], [place], [place] [address]  ABO/Rh     Status: None   Collection Time: 06/29/19 11:33 PM  Result Value Ref Range   ABO/RH(D) O POS    No rh immune globuloin      NOT A RH IMMUNE GLOBULIN CANDIDATE, PT RH POSITIVE Performed at [place] [address] [place], [place] [address]   C-reactive protein     Status: Abnormal   Collection Time: 06/30/19  2:00 AM  Result Value Ref Range   CRP 6.7 (H) <1.0 mg/dL    Comment: Performed at [facility], [place] [address] [place], [place] [address]  D-dimer, quantitative (not at [facility])     Status: Abnormal   Collection Time: 06/30/19  2:00 AM  Result Value Ref Range   D-Dimer, Quant 3.07 (H) 0.00 - 0.50 ug/mL-FEU    Comment: (NOTE) At the manufacturer cut-off of 0.50 ug/mL FEU, this assay has been documented to exclude PE with a sensitivity and negative predictive value of 97 to 99%.  At this  time, this assay has not been approved by the FDA to exclude DVT/VTE. Results should be correlated with clinical presentation. Performed at [facility], [place] [address] [place], [place] [address]   Ferritin     Status: Abnormal   Collection Time: 06/30/19  2:00 AM  Result Value Ref Range   Ferritin 10 (L) 11 - 307 ng/mL    Comment: Performed at [facility], [place] [address], [place], [place] [address]  Sedimentation rate     Status: Abnormal   Collection Time: 06/30/19  2:00 AM  Result Value Ref Range   Sed Rate 34 (H) 0 - 22 mm/hr    Comment: Performed at [place] [address], [place], [place] [address]  Iron and TIBC     Status: Abnormal   Collection Time: 06/30/19  2:00 AM  Result Value Ref Range   Iron 33 28 - 170 ug/dL   TIBC 630 (H) 250 - 450 ug/dL   Saturation Ratios 5 (L) 10.4 - 31.8 %   UIBC 597 ug/dL    Comment: Performed at [facility], [place] [address], [place], [place] [address]  Basic metabolic panel     Status: Abnormal   Collection Time: 06/30/19 10:02 AM  Result Value Ref Range   Sodium 136 135 - 145 mmol/L   Potassium 3.4 (L) 3.5 - 5.1 mmol/L   Chloride 105 98 - 111 mmol/L   CO2 20 (L) 22 - 32 mmol/L   Glucose, Bld 83 70 - 99 mg/dL   BUN <5 (L) 6 - 20 mg/dL   Creatinine, Ser 0.54 0.44 - 1.00 mg/dL   Calcium 8.5 (L) 8.9 - 10.3 mg/dL   GFR calc non Af Amer >60 >60 mL/min   GFR calc Af Amer >60 >60 mL/min   Anion gap 11 5 - 15    Comment: Performed at [facility], [place] [address], [place], [place] [address]  CBC     Status: Abnormal   Collection Time: 06/30/19 10:02 AM  Result Value Ref Range   WBC 9.7 4.0 - 10.5 K/uL   RBC 4.26 3.87 - 5.11 MIL/uL   Hemoglobin 11.1 (L) 12.0 - 15.0 g/dL   HCT 16.134.7 (L) 09.636.0 - 04.546.0 %   MCV 81.5 80.0 - 100.0 fL   MCH 26.1 26.0 - 34.0 pg   MCHC 32.0 30.0 - 36.0 g/dL   RDW 40.914.3 81.111.5 - 91.415.5 %   Platelets 409 (H) 150 - 400 K/uL   nRBC 0.0 0.0 - 0.2 %    Comment: Performed at [facility], [address] [place], [place] [address]    Dg Chest Portable 1 View  Result Date: 06/29/2019 CLINICAL DATA:  Cough and fever. EXAM: PORTABLE CHEST 1 VIEW COMPARISON:  None. FINDINGS: The heart size and mediastinal contours are within normal limits. Both lungs are clear. The visualized skeletal structures are unremarkable. IMPRESSION: No active disease. Electronically Signed   By: Gerome Samavid  Williams III M.D   On: 06/29/2019 21:34    Current scheduled medications . docusate sodium  100 mg Oral Daily  . enoxaparin (LOVENOX) injection  40 mg Subcutaneous Daily  . ferrous sulfate  325 mg Oral Q breakfast  . prenatal multivitamin  1 tablet Oral Q1200   I have reviewed the patient's current medications.  ASSESSMENT: Principal Problem:   COVID-19 virus infection Active Problems:   BMI 33.0-33.9,adult   Hypoxemia   Pregnancy and infectious disease in third trimester   SOB (shortness of breath)   Acute viral bronchitis   Tachypnea  PLAN: Continue oxygen supplementation as needed, continue to monitor closely Reactive NST earlier today, continue BID No signs/symptoms of PTL or other fetal distress; patient already consented for cesarean section in the event of an emergency. [facility] team is aware. Continue Lovenox for VTE prophylaxis. Continue routine antenatal care. Appreciate the co-management help from the [facility], will follow up with their recommendations   Jaynie CollinsUGONNA  Herberta Pickron, MD, FACOG Obstetrician & Gynecologist, [facility] for [facility], [facility]

## 2019-06-30 NOTE — Consult Note (Signed)
MFM Consultation Telemedicine Visit   Shannon [name], is a  G3 P1 at 36w 1d gestation, was admitted on 06/29/19 with shortness of breath, fever, greens sputum and malaise.  She was recently at a party with attendees all awaiting testing. She had fever, tachycardia, tachypnea and noted O2 Sat drop 92-88% when ambulatory that was subsequently confirmed as COVID-19 infection.  Currently, her condition has required nasal canula to maintain O2 satuartion above 95% and her anion gap has risen from 6 to 12. Based on her clinical features, the patient has moderate disease  that may progress to critical disease (respiratory failure/multiorgan dysfunction).  She does not have symptoms of preterm labor or rupture of membranes. NST is category I. Lab values are stable with exception of a rising anion gap.  I reviewed her chart, labs and discussed with Dr. Kennon Rounds to plan optimal obstetrical care. COVID-19 in pregnancy -Patient has mild disease and does not require mechanical ventilation.  -Spontaneous preterm birth may be slightly increased in COVID-19, but she is more-likely to have indicated preterm delivery because of maternal or fetal compromise. I anticipate there is a 20% likelihood of preterm delivery.   -Tocolysis is not indicated in the absence of uterine contractions.  -Heparin: Since maternal thrombosis risk is increased, prophylactic anticoagulation should be continued. Data is limited on therapeutic anticoagulation and is not indicated. If the patient is not intubated and needs delivery, she is likely to get regional anesthesia (spinal). I recommend unfractionated heparin 5,000 to 7,500 units q 12hourly till she completely recovers.  -Treatment of COVID-19 in pregnancy is largely supportive. Treatment with hydroxychloroquine, azithromycin or remdesivir are not used in pregnancy and they are investigational drugs only. Remdesivir has been used for compassionate care in some centers.  -Fetal  hypoxia is very difficult to ascertain based on maternal signs and vitals. It is reasonable to keep maternal oxygen saturation above 95%. NST is the only tool we have to predict fetal hypoxia and is subject to false positive readings.   Indications for delivery can be fetal or maternal. We would consider delivery if NST shows persistent decelerations. Nonreactive trace alone is not an indication since NST may be influenced by other factors including sedation.  Pregnancy can increase the risk of maternal hypoxemia from the physiological changes. Mechanical ventilation alone is not an indication for delivery. If at any stage it is deemed by ICU physicians that maternal respiratory or cardiovascular status is likely to remove by uterine decompression (delivery), cesarean section should be considered.   Our goal is to prolong pregnancy without maternal compromise so that the newborn is not at risk of prematurity.  If cardiac arrest occurs, emergency cesarean delivery should be performed.  Overall, we will follow recommendations based on management by ICU. No intensive care management should be withheld because of her pregnancy status. Most drugs are safe in pregnancy and the ICU team may discuss with our obstetricians.  Recommendations: -Hold magnesium sulfate. It may be considered if the patient is intubated and delivery is imminent. Discuss with ICU team. -Thromboprophylaxis with unfractionated heparin 5,000 or 7,500 units q 12 hourly. -Lateral decubitus (to relive uterine pressure on major vessels) to be considered if the patient is not intubated. -Intermittent NST (2 or 3 times daily) should be sufficient if maternal oxygenation is ensured. Keep O2 sat at or above 95% to minimize fetal hypoxia. More frequent or continuous monitoring may be necessary that will be decided by obstetrician on call. -Ultrasound request only after discussing with MFM. -  Family to be counseled on the possibility of  cesarean delivery for maternal or fetal indications. -Vaginal delivery is not contraindicated if the patient goes into spontaneous active labor (history of previous 2 vaginal deliveries) provided there is no maternal or fetal compromise. -No antiviral treatments exist for COVID-19 infection.   I spent 30 minutes in non-face to face communication with Shannon [name].   She was unavailable for consultation I will contact her if once she is stablalized and moved to the floor.  Shannon Oliveorenthian J. Rahsaan Weakland, MD  This patient was seen under our COVID-19 protocol

## 2019-06-30 NOTE — Progress Notes (Signed)
Family Medicine Teaching Service Daily Progress Note Intern Pager: [phone]  Patient name: Shannon [name] Medical record number: [id number] Date of birth: 02-20-1991 Age: 28 y.o. Gender: female  Primary Care Provider: Patient, No Pcp Per Consultants: OB Code Status: Full  Pt Overview and Major Events to Date:  7/3-admitted for shortness of breath with new oxygen requirement  Assessment and Plan: Shannon [name] is a 27 y.o. female presenting with a 3 day history of malaise, sore throat, headache, dyspnea and productive cough. PMH is significant for prediabetes and major depression.  Acute hypoxic respiratory failure secondary positive COVID  Vitals overnight are remarkable for temperature up to 99.6, tachypnea elevated to 32, more recently 21.  SARS coronavirus 2+, ESR 34, d-dimer 3, CRP 6.7.  History, physical and lab findings are consistent with a SARS-CoV-2 infection.  Will touch base with OB for appropriate management at this pregnancy in the setting of coronavirus infection. -Continue noninvasive respiratory interventions -Order respiratory panel -Continue nasal cannula, wean as tolerated -Continuous pulse ox -consult CCM if pt requires 4 L [place] or more -Tylenol PRN -Avoid NSAIDs  Anemia, normocytic Last hemoglobin 12.3 (05/10/2019).  Hemoglobin on admission 10.9.    Iron studies consistent with iron deficiency anemia. -start ferrous sulfate 325 mg daily  Pregnancy, third trimester 36w G3P1011 -OB following, appreciate recommendations -Daily NST  Major depression. History of post partum depression -Continue to monitor  Prediabetes-Well controlled  Glucose today 93. Last HbA1C 01/15/19: 5.7.  -Monitor CBGs  FEN/GI: General diet  Prophylaxis: Lovenox  Disposition: 2-3 additional days of hospitalization anticipated prior to DC  Subjective:  COVID positive.  No acute events overnight.  Stable on 2 L nasal cannula.  She reports she is breathing comfortably this  morning.  She has no new complaints this morning.  She was informed that we would continue to monitor her respiratory status and would not plan for any additional measures unless her respiratory status worsened.  She acknowledged understanding.  Objective: Temp:  [99.1 F (37.3 C)-99.6 F (37.6 C)] 99.6 F (37.6 C) (07/03 2214) Pulse Rate:  [87-122] 91 (07/04 0500) Resp:  [14-32] 19 (07/04 0500) BP: (105-119)/(60-80) 116/75 (07/04 0500) SpO2:  [92 %-100 %] 99 % (07/04 0500) Physical Exam: Deferred to attending provider  Laboratory: Recent Labs  Lab 06/29/19 2206  WBC 10.1  HGB 10.9*  HCT 34.4*  PLT 407*   Recent Labs  Lab 06/29/19 2206  NA 135  K 3.6  CL 106  CO2 17*  BUN 5*  CREATININE 0.47  CALCIUM 8.4*  GLUCOSE 93    Imaging/Diagnostic Tests: Dg Chest Portable 1 View  Result Date: 06/29/2019 CLINICAL DATA:  Cough and fever. EXAM: PORTABLE CHEST 1 VIEW COMPARISON:  None. FINDINGS: The heart size and mediastinal contours are within normal limits. Both lungs are clear. The visualized skeletal structures are unremarkable. IMPRESSION: No active disease. Electronically Signed   By: Dorise Bullion III M.D   On: 06/29/2019 21:34     Matilde Haymaker, MD 06/30/2019, 5:06 AM PGY-2, [facility] Intern pager: [phone], text pages welcome

## 2019-06-30 NOTE — ED Notes (Signed)
RN was in a contact room when this RN called to give report on 2W, will return call soon.

## 2019-06-30 NOTE — ED Notes (Signed)
ED TO INPATIENT HANDOFF REPORT  ED Nurse Name and Phone #: Idalia Needleaige, RN [phone]  S Name/Age/Gender Shannon [name] 28 y.o. female Room/Bed: 026C/026C  Code Status   Code Status: Full Code  Home/SNF/Other Home Patient oriented to: self, place, time and situation Is this baseline? Yes   Triage Complete: Triage complete  Chief Complaint [redacted] weeks Pregnant/Sent for COVID Test  Triage Note Pt c/o fever, cough and headache x 3 days. Reports lower back pain and lower abdominal pain when coughing. Pt tested for COVID-19 yesterday, no result as of today. Pt [redacted] weeks pregnant, states baby has been less active today than normal. Per MAU, pt to be evaluated in MAU, rapid response RN to be paged when pt in a room.    Allergies Allergies  Allergen Reactions  . Robitussin (Alcohol Free) [Guaifenesin] Itching    Took Benadryl, helped    Level of Care/Admitting Diagnosis ED Disposition    ED Disposition Condition Comment   Admit  [facility] [[id number]]  Level of Care: Med-Surg [16]  Covid Evaluation: Person Under Investigation (PUI)  Diagnosis: SOB (shortness of breath) [562130][[id number]]  Admitting Physician: Mirian MoFRANK, PETER [[id number]]  Attending Physician: MCDIARMID, TODD D [[id number]]  Estimated length of stay: past midnight tomorrow  Certification:: I certify this patient will need inpatient services for at least 2 midnights  PT Class (Do Not Modify): Inpatient [101]  PT Acc Code (Do Not Modify): Private [1]       B Medical/Surgery History Past Medical History:  Diagnosis Date  . Anxiety   . Depression   . Migraines   . Post partum depression    Past Surgical History:  Procedure Laterality Date  . CHOLECYSTECTOMY    . FOOT SURGERY Left   . KNEE SURGERY Left      A IV Location/Drains/Wounds Patient Lines/Drains/Airways Status   Active Line/Drains/Airways    Name:   Placement date:   Placement time:   Site:   Days:   Peripheral IV 06/29/19 Left Hand    06/29/19    1940    Hand   1   Epidural Catheter 05/20/16   05/20/16    2118     1136          Intake/Output Last 24 hours  Intake/Output Summary (Last 24 hours) at 06/30/2019 0802 Last data filed at 06/29/2019 2033 Gross per 24 hour  Intake 495 ml  Output -  Net 495 ml    Labs/Imaging Results for orders placed or performed during the hospital encounter of 06/29/19 (from the past 48 hour(s))  Group A Strep by PCR     Status: None   Collection Time: 06/29/19  8:48 PM   Specimen: Throat; Sterile Swab  Result Value Ref Range   Group A Strep by PCR NOT DETECTED NOT DETECTED    Comment: Performed at [facility], [address] [place], [place] [address]  CBC with Differential     Status: Abnormal   Collection Time: 06/29/19 10:06 PM  Result Value Ref Range   WBC 10.1 4.0 - 10.5 K/uL   RBC 4.23 3.87 - 5.11 MIL/uL   Hemoglobin 10.9 (L) 12.0 - 15.0 g/dL   HCT 28.434.4 (L) 13.236.0 - 44.046.0 %   MCV 81.3 80.0 - 100.0 fL   MCH 25.8 (L) 26.0 - 34.0 pg   MCHC 31.7 30.0 - 36.0 g/dL   RDW 10.214.0 72.511.5 - 36.615.5 %   Platelets 407 (H) 150 - 400  K/uL   nRBC 0.0 0.0 - 0.2 %   Neutrophils Relative % 73 %   Neutro Abs 7.3 1.7 - 7.7 K/uL   Lymphocytes Relative 19 %   Lymphs Abs 1.9 0.7 - 4.0 K/uL   Monocytes Relative 8 %   Monocytes Absolute 0.8 0.1 - 1.0 K/uL   Eosinophils Relative 0 %   Eosinophils Absolute 0.0 0.0 - 0.5 K/uL   Basophils Relative 0 %   Basophils Absolute 0.0 0.0 - 0.1 K/uL   Immature Granulocytes 0 %   Abs Immature Granulocytes 0.04 0.00 - 0.07 K/uL    Comment: Performed at [facility] [address] [place], [place] [address]  Basic metabolic panel     Status: Abnormal   Collection Time: 06/29/19 10:06 PM  Result Value Ref Range   Sodium 135 135 - 145 mmol/L   Potassium 3.6 3.5 - 5.1 mmol/L   Chloride 106 98 - 111 mmol/L   CO2 17 (L) 22 - 32 mmol/L   Glucose, Bld 93 70 - 99 mg/dL   BUN 5 (L) 6 - 20 mg/dL   Creatinine, Ser 0.47 0.44 - 1.00 mg/dL   Calcium 8.4  (L) 8.9 - 10.3 mg/dL   GFR calc non Af Amer >60 >60 mL/min   GFR calc Af Amer >60 >60 mL/min   Anion gap 12 5 - 15    Comment: Performed at [facility], [place] [employer] [address], [place], [place] [address]  Lactic acid, plasma     Status: None   Collection Time: 06/29/19 10:06 PM  Result Value Ref Range   Lactic Acid, Venous 0.7 0.5 - 1.9 mmol/L    Comment: Performed at [facility] [address], [place], [place] [address]  SARS Coronavirus 2 (CEPHEID - Performed in [place] hospital lab), Hosp Order     Status: Abnormal   Collection Time: 06/29/19 10:14 PM   Specimen: Nasopharyngeal Swab  Result Value Ref Range   SARS Coronavirus 2 POSITIVE (A) NEGATIVE    Comment: RESULT CALLED TO, READ BACK BY AND VERIFIED WITH: T Martinique RN 06/29/19 2320 JDW (NOTE) If result is NEGATIVE SARS-CoV-2 target nucleic acids are NOT DETECTED. The SARS-CoV-2 RNA is generally detectable in upper and lower  respiratory specimens during the acute phase of infection. The lowest  concentration of SARS-CoV-2 viral copies this assay can detect is 250  copies / mL. A negative result does not preclude SARS-CoV-2 infection  and should not be used as the sole basis for treatment or other  patient management decisions.  A negative result may occur with  improper specimen collection / handling, submission of specimen other  than nasopharyngeal swab, presence of viral mutation(s) within the  areas targeted by this assay, and inadequate number of viral copies  (<250 copies / mL). A negative result must be combined with clinical  observations, patient history, and epidemiological information. If result is POSITIVE SARS-CoV-2 target nucleic acids are DETECTED. The SARS- CoV-2 RNA is generally detectable in upper and lower  respiratory specimens during the acute phase of infection.  Positive  results are indicative of active infection with SARS-CoV-2.  Clinical  correlation with patient history and other  diagnostic information is  necessary to determine patient infection status.  Positive results do  not rule out bacterial infection or co-infection with other viruses. If result is PRESUMPTIVE POSTIVE SARS-CoV-2 nucleic acids MAY BE PRESENT.   A presumptive positive result was obtained on the submitted specimen  and confirmed on repeat testing.  While 2019 novel coronavirus  (SARS-CoV-2) nucleic acids may be present in the submitted sample  additional confirmatory testing may be necessary for epidemiological  and / or clinical management purposes  to differentiate between  SARS-CoV-2 and other Sarbecovirus currently known to infect humans.  If clinically indicated additional testing with an alternate test  methodology [phone]([id number]) is advised . The SARS-CoV-2 RNA is generally  detectable in upper and lower respiratory specimens during the acute  phase of infection. The expected result is Negative. Fact Sheet for Patients:  BoilerBrush.com.cyhttps://www.fda.gov/media/136312/download Fact Sheet for Healthcare Providers: https://pope.com/https://www.fda.gov/media/136313/download This test is not yet approved or cleared by the [place] FDA and has been authorized for detection and/or diagnosis of SARS-CoV-2 by FDA under an Emergency Use Authorization (EUA).  This EUA will remain in effect (meaning this test can be used) for the duration of the COVID-19 declaration under Section 564(b)(1) of the Act, 21 U.S.C. section 360bbb-3(b)(1), unless the authorization is terminated or revoked sooner. Performed at [facility], [address] [place], [place] [address]   Type and screen [facility]     Status: None   Collection Time: 06/29/19 11:05 PM  Result Value Ref Range   ABO/RH(D) O POS    Antibody Screen NEG    Sample Expiration      07/02/2019,2359 Performed at [facility], [address] [place], [place] [address]   Rapid HIV screen (HIV 1/2 Ab+Ag)     Status: None   Collection Time:  06/29/19 11:05 PM  Result Value Ref Range   HIV-1 P24 Antigen - HIV24 NON REACTIVE NON REACTIVE   HIV 1/2 Antibodies NON REACTIVE NON REACTIVE   Interpretation (HIV Ag Ab)      A non reactive test result means that HIV 1 or HIV 2 antibodies and HIV 1 p24 antigen were not detected in the specimen.    Comment: Performed at [facility], [address] [place], [place] [address]  ABO/Rh     Status: None   Collection Time: 06/29/19 11:33 PM  Result Value Ref Range   ABO/RH(D)      O POS Performed at [facility], [address] [place], [place] [address]   C-reactive protein     Status: Abnormal   Collection Time: 06/30/19  2:00 AM  Result Value Ref Range   CRP 6.7 (H) <1.0 mg/dL    Comment: Performed at [facility], [address] [place], [place] [address]  D-dimer, quantitative (not at [facility])     Status: Abnormal   Collection Time: 06/30/19  2:00 AM  Result Value Ref Range   D-Dimer, Quant 3.07 (H) 0.00 - 0.50 ug/mL-FEU    Comment: (NOTE) At the manufacturer cut-off of 0.50 ug/mL FEU, this assay has been documented to exclude PE with a sensitivity and negative predictive value of 97 to 99%.  At this time, this assay has not been approved by the FDA to exclude DVT/VTE. Results should be correlated with clinical presentation. Performed at [facility], [address] [place], [place] [address]   Ferritin     Status: Abnormal   Collection Time: 06/30/19  2:00 AM  Result Value Ref Range   Ferritin 10 (L) 11 - 307 ng/mL    Comment: Performed at [facility], [address] [place], [place] [address]  Sedimentation rate     Status: Abnormal   Collection  Time: 06/30/19  2:00 AM  Result Value Ref Range   Sed Rate 34 (H) 0 - 22 mm/hr    Comment: Performed at [facility], [address] [place], [place] [address]  Iron and TIBC     Status: Abnormal   Collection Time: 06/30/19  2:00 AM  Result Value Ref Range   Iron 33 28 - 170 ug/dL   TIBC  536630 (H) 644250 - 034450 ug/dL   Saturation Ratios 5 (L) 10.4 - 31.8 %   UIBC 597 ug/dL    Comment: Performed at [facility], [address] [place], [place] [address]   Dg Chest Portable 1 View  Result Date: 06/29/2019 CLINICAL DATA:  Cough and fever. EXAM: PORTABLE CHEST 1 VIEW COMPARISON:  None. FINDINGS: The heart size and mediastinal contours are within normal limits. Both lungs are clear. The visualized skeletal structures are unremarkable. IMPRESSION: No active disease. Electronically Signed   By: Gerome Samavid  Williams III M.D   On: 06/29/2019 21:34    Pending Labs Unresulted Labs (From admission, onward)    Start     Ordered   06/30/19 0500  Basic metabolic panel  Tomorrow morning,   R     06/29/19 2339   06/30/19 0500  CBC  Tomorrow morning,   R     06/29/19 2339   06/29/19 2336  Respiratory Panel by PCR  Add-on,   AD     06/29/19 2339   06/29/19 2241  RPR  Add-on,   AD     06/29/19 2242          Vitals/Pain Today's Vitals   06/30/19 0500 06/30/19 0530 06/30/19 0600 06/30/19 0630  BP: 116/75  109/70   Pulse: 91  94   Resp: 19  (!) 23   Temp:      TempSrc:      SpO2: 99%  99%   PainSc:  Asleep  Asleep    Isolation Precautions Airborne and Contact precautions  Medications Medications  acetaminophen (TYLENOL) tablet 650 mg (has no administration in time range)  zolpidem (AMBIEN) tablet 5 mg (has no administration in time range)  docusate sodium (COLACE) capsule 100 mg (has no administration in time range)  calcium carbonate (TUMS - dosed in mg elemental calcium) chewable tablet 400 mg of elemental calcium (has no administration in time range)  prenatal multivitamin tablet 1 tablet (has no administration in time range)  enoxaparin (LOVENOX) injection 40 mg (has no administration in time range)  ferrous sulfate tablet 325 mg (has no administration in time range)  metoCLOPramide (REGLAN) injection 10 mg (10 mg Intravenous Given 06/29/19 2035)  acetaminophen (TYLENOL)  tablet 1,000 mg (1,000 mg Oral Given 06/29/19 2034)  pseudoephedrine (SUDAFED) tablet 30 mg (30 mg Oral Given 06/29/19 2159)  oxymetazoline (AFRIN) 0.05 % nasal spray 1 spray (1 spray Each Nare Given 06/29/19 2032)  sodium chloride 0.9 % bolus 500 mL (0 mLs Intravenous Stopped 06/29/19 2033)    Mobility walks Low fall risk   Focused Assessments Pulmonary Assessment Handoff:  Lung sounds: Bilateral Breath Sounds: Clear O2 Device: Nasal Cannula O2 Flow Rate (L/min): 2 L/min      R Recommendations: See Admitting Provider Note  Report given to: 2W36  Additional Notes:

## 2019-06-30 NOTE — Progress Notes (Signed)
NST reactive. Denies uc's, vaginal bleeding or leaking of fluid. Dr. Harolyn Rutherford notified.

## 2019-06-30 NOTE — ED Notes (Signed)
Nurse collecting labs. 

## 2019-06-30 NOTE — Progress Notes (Signed)
[facility] PERFORMING NST; PT REPORTS POSITIVE FETAL MOVEMENT, NO VAGINAL BLEEDING OR LEAKING OF FLUID; REPORTS OCCASIONAL MILD CRAMPS, NOT CONSISTENT

## 2019-07-01 DIAGNOSIS — R0682 Tachypnea, not elsewhere classified: Secondary | ICD-10-CM

## 2019-07-01 DIAGNOSIS — R6889 Other general symptoms and signs: Secondary | ICD-10-CM

## 2019-07-01 DIAGNOSIS — R0602 Shortness of breath: Secondary | ICD-10-CM

## 2019-07-01 LAB — RPR: RPR Ser Ql: NONREACTIVE

## 2019-07-01 LAB — GLUCOSE, CAPILLARY: Glucose-Capillary: 75 mg/dL (ref 70–99)

## 2019-07-01 MED ORDER — HEPARIN SODIUM (PORCINE) 5000 UNIT/ML IJ SOLN
5000.0000 [IU] | Freq: Two times a day (BID) | INTRAMUSCULAR | Status: DC
  Administered 2019-07-01 – 2019-07-02 (×3): 5000 [IU] via SUBCUTANEOUS
  Filled 2019-07-01 (×3): qty 1

## 2019-07-01 NOTE — Plan of Care (Signed)

## 2019-07-01 NOTE — Progress Notes (Signed)
Family Medicine Teaching Service Daily Progress Note Intern Pager: [phone]  Patient name: Shannon [name] Medical record number: [id number] Date of birth: 1991-04-18 Age: 28 y.o. Gender: female  Primary Care Provider: Patient, No Pcp Per Consultants: OB,  MFM Code Status: Full   Pt Overview and Major Events to Date:  Patient admitted to [facility] on 7/3 COVID positive   Assessment and Plan: Shannon A Swiggettis a 28 y.o.femalepresenting with a 3 day history of malaise, sore throat, headache, dyspnea and productive cough. PMH is significant forprediabetes and major depression.  Acute hypoxic respiratory failure secondary positive COVID  VSS. Currently satting well on 2L O2. Labs significant for COVID positive, ESR 34, d-dimer 3, CRP 6.7.  OB and MFM consulted with recommendations of holding magnesium sulfate, DVT prophylaxis of heparin 5000 units every 12 hours, positioning in lateral decubitus, intermittent NST, delivery pending maternal status. -OB consulted, appreciate recommendations -MFM consulted, appreciate recommendations  -Continue noninvasive respiratory interventions -RVP pending -Continue nasal cannula, wean as tolerated -Continuous pulse ox -consult CCM if pt requires 4 L [place] or more -Tylenol PRN -Avoid NSAIDs  Anemia, normocytic Hgb 11.1 on 7/4. Hemoglobin on admission 10.9. Iron studies consistent with iron deficiency anemia. -start ferrous sulfate 325 mg daily -will no longer get labs unless symptomatic   Pregnancy, third trimester 28w2d3P1011. RN informed me that NST showed FHR 140 without significant decelerations, unable to review strip personally.  -OB following, appreciate recommendations -Intermittent NST -will need GBS between 36 and 37 wks, will defer to OB as they are following   Major depression.History of post partum depression -Continue to monitor -CSW consult at delivery   Prediabetes-Well controlled Glucose 83 on 7/4. 1hr GTT on 5/14  135.Last HbA1C 01/15/19: 5.7. -Monitor CBGs -will no longer get BMP unless symptomatic   FEN/GI: regular diet  PPx: heparin 5000U q12h per MFM  Disposition: awaiting clinical improvement  Subjective:  Patient reports she feels well today. Still requiring 2L O2 but states she has been trying to walk around room as much as possible. Reports appropriate fetal movement. Denies vaginal bleeding or discharge.   Objective: Temp:  [97.4 F (36.3 C)-98.4 F (36.9 C)] 98.3 F (36.8 C) (07/04 2000) Pulse Rate:  [96-107] 96 (07/04 2000) Resp:  [16-23] 16 (07/04 2000) BP: (107-117)/(59-73) 117/69 (07/04 2000) SpO2:  [99 %-100 %] 100 % (07/04 2000) Weight:  [99 kg] 99 kg (07/04 0914) Physical Exam: Patient is COVID positive. Physical Exam will be performed by attending physician, Dr. McDiarmid   Laboratory: Recent Labs  Lab 06/29/19 2206 06/30/19 1002  WBC 10.1 9.7  HGB 10.9* 11.1*  HCT 34.4* 34.7*  PLT 407* 409*   Recent Labs  Lab 06/29/19 2206 06/30/19 1002  NA 135 136  K 3.6 3.4*  CL 106 105  CO2 17* 20*  BUN 5* <5*  CREATININE 0.47 0.54  CALCIUM 8.4* 8.5*  GLUCOSE 93 83     Imaging/Diagnostic Tests: No new imaging   ACaroline More DO 07/01/2019, 6:52 AM PGY-3, [place] pager: [phone] text pages welcome

## 2019-07-01 NOTE — Progress Notes (Signed)
NST baseline 130bpm With 15x15 accelerations and no decels. Pt. Denies ctx, LOF, or bleeding. Pt. On 2L oxygen via nasal cannula. Plans for discharge tomorrow if oxygen saturation good with ambulation-per patient. Pt. Resting comfortably during NST. Attending Dr. Kennon Rounds notified. Fetal movement palpated during assessment.

## 2019-07-01 NOTE — Progress Notes (Signed)
[facility] COMPREHENSIVE PROGRESS NOTE  Shannon [name] is a 28 y.o. G3P1011 at [redacted]w[redacted]d who is admitted for COVID infection with associated hypoxia.  Estimated Date of Delivery: 07/27/19 Fetal presentation is cephalic.  Length of Stay:  2 Days. Admitted 06/29/2019  Subjective: No respiratory issues since yesterday, still on 2L [place] of oxygen.  Patient reports good fetal movement.  She reports no uterine contractions, no bleeding and no loss of fluid per vagina.  Vitals:  Blood pressure 117/69, pulse 96, temperature 98.3 F (36.8 C), temperature source Oral, resp. rate 16, height 5\' 3"  (1.6 m), weight 99 kg, last menstrual period 10/20/2018, SpO2 100 %. Physical Examination: Gen- NAD Chest - normal effort Heart - regular rate Abdomen - gravid, non-tender Extremities - Homan's sign negative bilaterally Skin - normal coloration and turgor, no rashes, no suspicious skin lesions noted FHR- Category 1 x 2 yesterday  Labs Results for orders placed or performed during the hospital encounter of 06/29/19 (from the past 48 hour(s))  Group A Strep by PCR     Status: None   Collection Time: 06/29/19  8:48 PM   Specimen: Throat; Sterile Swab  Result Value Ref Range   Group A Strep by PCR NOT DETECTED NOT DETECTED    Comment: Performed at [facility], [address], [place], [place] [address]  CBC with Differential     Status: Abnormal   Collection Time: 06/29/19 10:06 PM  Result Value Ref Range   WBC 10.1 4.0 - 10.5 K/uL   RBC 4.23 3.87 - 5.11 MIL/uL   Hemoglobin 10.9 (L) 12.0 - 15.0 g/dL   HCT 34.4 (L) 36.0 - 46.0 %   MCV 81.3 80.0 - 100.0 fL   MCH 25.8 (L) 26.0 - 34.0 pg   MCHC 31.7 30.0 - 36.0 g/dL   RDW 14.0 11.5 - 15.5 %   Platelets 407 (H) 150 - 400 K/uL   nRBC 0.0 0.0 - 0.2 %   Neutrophils Relative % 73 %   Neutro Abs 7.3 1.7 - 7.7 K/uL   Lymphocytes Relative 19 %   Lymphs Abs 1.9 0.7 - 4.0 K/uL   Monocytes Relative 8 %   Monocytes Absolute 0.8 0.1 - 1.0  K/uL   Eosinophils Relative 0 %   Eosinophils Absolute 0.0 0.0 - 0.5 K/uL   Basophils Relative 0 %   Basophils Absolute 0.0 0.0 - 0.1 K/uL   Immature Granulocytes 0 %   Abs Immature Granulocytes 0.04 0.00 - 0.07 K/uL    Comment: Performed at [facility], [address] [place], [place] [address]  Basic metabolic panel     Status: Abnormal   Collection Time: 06/29/19 10:06 PM  Result Value Ref Range   Sodium 135 135 - 145 mmol/L   Potassium 3.6 3.5 - 5.1 mmol/L   Chloride 106 98 - 111 mmol/L   CO2 17 (L) 22 - 32 mmol/L   Glucose, Bld 93 70 - 99 mg/dL   BUN 5 (L) 6 - 20 mg/dL   Creatinine, Ser 0.47 0.44 - 1.00 mg/dL   Calcium 8.4 (L) 8.9 - 10.3 mg/dL   GFR calc non Af Amer >60 >60 mL/min   GFR calc Af Amer >60 >60 mL/min   Anion gap 12 5 - 15    Comment: Performed at [facility], [place] [address] [place],  [address]  Lactic acid, plasma     Status: None   Collection Time: 06/29/19 10:06 PM  Result Value Ref Range   Lactic Acid, Venous 0.7 0.5 - 1.9 mmol/L    Comment: Performed at [facility], [address] [place], [place] [address]  SARS Coronavirus 2 (CEPHEID - Performed in [facility] hospital lab), Hosp Order     Status: Abnormal   Collection Time: 06/29/19 10:14 PM   Specimen: Nasopharyngeal Swab  Result Value Ref Range   SARS Coronavirus 2 POSITIVE (A) NEGATIVE    Comment: RESULT CALLED TO, READ BACK BY AND VERIFIED WITH: T SwazilandJORDAN RN 06/29/19 2320 JDW (NOTE) If result is NEGATIVE SARS-CoV-2 target nucleic acids are NOT DETECTED. The SARS-CoV-2 RNA is generally detectable in upper and lower  respiratory specimens during the acute phase of infection. The lowest  concentration of SARS-CoV-2 viral copies this assay can detect is 250  copies / mL. A negative result does not preclude SARS-CoV-2 infection  and should not be used as the sole basis for treatment or other  patient management decisions.  A negative result may occur with  improper  specimen collection / handling, submission of specimen other  than nasopharyngeal swab, presence of viral mutation(s) within the  areas targeted by this assay, and inadequate number of viral copies  (<250 copies / mL). A negative result must be combined with clinical  observations, patient history, and epidemiological information. If result is POSITIVE SARS-CoV-2 target nucleic acids are DETECTED. The SARS- CoV-2 RNA is generally detectable in upper and lower  respiratory specimens during the acute phase of infection.  Positive  results are indicative of active infection with SARS-CoV-2.  Clinical  correlation with patient history and other diagnostic information is  necessary to determine patient infection status.  Positive results do  not rule out bacterial infection or co-infection with other viruses. If result is PRESUMPTIVE POSTIVE SARS-CoV-2 nucleic acids MAY BE PRESENT.   A presumptive positive result was obtained on the submitted specimen  and confirmed on repeat testing.  While 2019 novel coronavirus  (SARS-CoV-2) nucleic acids may be present in the submitted sample  additional confirmatory testing may be necessary for epidemiological  and / or clinical management purposes  to differentiate between  SARS-CoV-2 and other Sarbecovirus currently known to infect humans.  If clinically indicated additional testing with an alternate test  methodology [phone]([id number]) is advised . The SARS-CoV-2 RNA is generally  detectable in upper and lower respiratory specimens during the acute  phase of infection. The expected result is Negative. Fact Sheet for Patients:  BoilerBrush.com.cyhttps://www.fda.gov/media/136312/download Fact Sheet for Healthcare Providers: https://pope.com/https://www.fda.gov/media/136313/download This test is not yet approved or cleared by the [place] FDA and has been authorized for detection and/or diagnosis of SARS-CoV-2 by FDA under an Emergency Use Authorization (EUA).  This EUA will remain in  effect (meaning this test can be used) for the duration of the COVID-19 declaration under Section 564(b)(1) of the Act, 21 U.S.C. section 360bbb-3(b)(1), unless the authorization is terminated or revoked sooner. Performed at [facility], [address] [place], [place] [address]   Type and screen [facility]     Status: None   Collection Time: 06/29/19 11:05 PM  Result Value Ref Range   ABO/RH(D) O POS    Antibody Screen NEG    Sample Expiration      07/02/2019,2359 Performed at [facility], [address] [place], [place] [address]   RPR     Status: None   Collection Time: 06/29/19 11:05 PM  Result Value Ref Range  RPR Ser Ql Non Reactive Non Reactive    Comment: (NOTE) Performed At: [facility] [address] [place], [place] [id number] Jolene SchimkeNagendra Sanjai MD WU:[phone]   Rapid HIV screen (HIV 1/2 Ab+Ag)     Status: None   Collection Time: 06/29/19 11:05 PM  Result Value Ref Range   HIV-1 P24 Antigen - HIV24 NON REACTIVE NON REACTIVE   HIV 1/2 Antibodies NON REACTIVE NON REACTIVE   Interpretation (HIV Ag Ab)      A non reactive test result means that HIV 1 or HIV 2 antibodies and HIV 1 p24 antigen were not detected in the specimen.    Comment: Performed at [facility], [address] [place], [place] [address]  ABO/Rh     Status: None   Collection Time: 06/29/19 11:33 PM  Result Value Ref Range   ABO/RH(D) O POS    No rh immune globuloin      NOT A RH IMMUNE GLOBULIN CANDIDATE, PT RH POSITIVE Performed at [facility], [address] [place], [place] [address]   C-reactive protein     Status: Abnormal   Collection Time: 06/30/19  2:00 AM  Result Value Ref Range   CRP 6.7 (H) <1.0 mg/dL    Comment: Performed at [facility], [address] [place], [place] [address]  D-dimer, quantitative (not at [facility])     Status: Abnormal   Collection Time: 06/30/19  2:00 AM  Result Value Ref Range   D-Dimer, Quant 3.07  (H) 0.00 - 0.50 ug/mL-FEU    Comment: (NOTE) At the manufacturer cut-off of 0.50 ug/mL FEU, this assay has been documented to exclude PE with a sensitivity and negative predictive value of 97 to 99%.  At this time, this assay has not been approved by the FDA to exclude DVT/VTE. Results should be correlated with clinical presentation. Performed at [facility], [address] [place], [place] [address]   Ferritin     Status: Abnormal   Collection Time: 06/30/19  2:00 AM  Result Value Ref Range   Ferritin 10 (L) 11 - 307 ng/mL    Comment: Performed at [facility], [address] [place], [place] [address]  Sedimentation rate     Status: Abnormal   Collection Time: 06/30/19  2:00 AM  Result Value Ref Range   Sed Rate 34 (H) 0 - 22 mm/hr    Comment: Performed at [facility], [address] [place], [place] [address]  Iron and TIBC     Status: Abnormal   Collection Time: 06/30/19  2:00 AM  Result Value Ref Range   Iron 33 28 - 170 ug/dL   TIBC 272630 (H) 536250 - 644450 ug/dL   Saturation Ratios 5 (L) 10.4 - 31.8 %   UIBC 597 ug/dL    Comment: Performed at [facility], [address] [place], [place] [address]  Basic metabolic panel     Status: Abnormal   Collection Time: 06/30/19 10:02 AM  Result Value Ref Range   Sodium 136 135 - 145 mmol/L   Potassium 3.4 (L) 3.5 - 5.1 mmol/L   Chloride 105 98 - 111 mmol/L   CO2 20 (L) 22 - 32 mmol/L   Glucose, Bld 83 70 - 99 mg/dL   BUN <5 (L) 6 - 20 mg/dL   Creatinine, Ser 2.590.54 0.44 - 1.00 mg/dL   Calcium 8.5 (L) 8.9 - 10.3 mg/dL   GFR calc  non Af Amer >60 >60 mL/min   GFR calc Af Amer >60 >60 mL/min   Anion gap 11 5 - 15    Comment: Performed at [facility], [address] [place], [place] [address]  CBC     Status: Abnormal   Collection Time: 06/30/19 10:02 AM  Result Value Ref Range   WBC 9.7 4.0 - 10.5 K/uL   RBC 4.26 3.87 - 5.11 MIL/uL   Hemoglobin 11.1 (L) 12.0 - 15.0 g/dL   HCT 19.134.7 (L) 47.836.0 - 29.546.0  %   MCV 81.5 80.0 - 100.0 fL   MCH 26.1 26.0 - 34.0 pg   MCHC 32.0 30.0 - 36.0 g/dL   RDW 62.114.3 30.811.5 - 65.715.5 %   Platelets 409 (H) 150 - 400 K/uL   nRBC 0.0 0.0 - 0.2 %    Comment: Performed at [facility], [address] [place], [place] [address]    Dg Chest Portable 1 View  Result Date: 06/29/2019 CLINICAL DATA:  Cough and fever. EXAM: PORTABLE CHEST 1 VIEW COMPARISON:  None. FINDINGS: The heart size and mediastinal contours are within normal limits. Both lungs are clear. The visualized skeletal structures are unremarkable. IMPRESSION: No active disease. Electronically Signed   By: Gerome Samavid  Williams III M.D   On: 06/29/2019 21:34    Current scheduled medications . docusate sodium  100 mg Oral Daily  . enoxaparin (LOVENOX) injection  40 mg Subcutaneous Daily  . ferrous sulfate  325 mg Oral Q breakfast  . prenatal multivitamin  1 tablet Oral Q1200   I have reviewed the patient's current medications.  ASSESSMENT: Principal Problem:   COVID-19 virus infection Active Problems:   BMI 33.0-33.9,adult   Hypoxemia requiring supplemental oxygen   Pregnancy and infectious disease in third trimester   SOB (shortness of breath)   Acute viral bronchitis   Tachypnea  PLAN: Continue oxygen supplementation as needed, continue to monitor closely Reactive NST x 2 yesterday, continue BID monitoring No signs/symptoms of PTL or other fetal distress; patient already consented for cesarean section in the event of an emergency. [facility] is aware of patient's admission Continue Lovenox for VTE prophylaxis. Continue routine antenatal care. Appreciate the co-management help from the [facility], will follow up with their recommendations   Jaynie CollinsUGONNA  Ali Mohl, MD, FACOG Obstetrician & Gynecologist, [facility], [facility]

## 2019-07-01 NOTE — Progress Notes (Signed)
NST completed. No vaginal bleeding or leaking of fluid. Denies feeling uc's. None traced during monitoring. Says she feels the baby move.

## 2019-07-02 DIAGNOSIS — Z6833 Body mass index (BMI) 33.0-33.9, adult: Secondary | ICD-10-CM

## 2019-07-02 DIAGNOSIS — J208 Acute bronchitis due to other specified organisms: Secondary | ICD-10-CM

## 2019-07-02 LAB — OB RESULTS CONSOLE GC/CHLAMYDIA: Gonorrhea: NEGATIVE

## 2019-07-02 LAB — GLUCOSE, CAPILLARY: Glucose-Capillary: 91 mg/dL (ref 70–99)

## 2019-07-02 NOTE — Progress Notes (Addendum)
G3P1 at 36 3/7 weeks inpatient for complications of COVID 19.  VSS.  Sats 99% on room air.  Dry cough.  POC for discharge home today.  No complaints of bleeding, leaking.  Good fetal movement reported.  NST started.

## 2019-07-02 NOTE — Discharge Summary (Signed)
Physician Discharge Summary   Patient ID: Shannon [name] [id number] 28 y.o. [birth year]  Admit date: 06/29/2019  Discharge date and time: 07/02/2019  2:54 PM   Admitting Physician: Blane Ohara Lorrinda Ramstad, MD   Discharge Physician: Blane Ohara Delbert Vu, MD  Admission Diagnoses: Hypoxemia [R09.02] Suspected Covid-19 Virus Infection [R68.89]  Discharge Diagnoses:  Principal Problem:   COVID-19 virus infection causing Acute viral bronchitis  Active Problems:   BMI 33.0-33.9,adult   Hypoxemia -resolved   Pregnancy and infectious disease in third trimester   Normocytic Anemia  Admission Condition: good  Discharged Condition: stable  Indication for Admission: Hypoxemia  Hospital Course:  Patient, G3P1 at [redacted] weeks EGA presented with 3 days of flu-like symptoms including dyspnea on exertion and productive cough who was 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  Her SaO2 desaturated to 88% on room air with ambulation in ED.  She was admitted for abservation given her mild-moderate illness with moderate risk of progression to severe CoViD.   Patient was admitted to [facility] wing of [facility] in a negative pressure patient room.  She was supplemented with 2 l/min [place] oxygen until 07/02/19 when repeat SaO2 on [place] air showed no desaturation to unacceptable levels and she felt no shortness of breath with exertion and was feeling better overall.   Twice daily fetal NST were reassuring. No OB complications during course of hospitalization.  Consults: Obstetrics service  Significant Diagnostic Studies: Rapid CoViD test postitive C-Reactive Protein 6.7 mg/dL  Treatments: Oxygen supplementation  Discharge Exam: VS reviewed  General: Alert, cooperative, NAD, no increase work of breathing Cor: RRR, No murmur nor gallup Lungs: BCTA, no acc mm use, (+) speaking in full sentences without difficulty  Disposition: Discharge disposition: 01-Home or Self Care       Patient  Instructions:  Allergies as of 07/02/2019      Reactions   Robitussin (alcohol Free) [guaifenesin] Itching   Took Benadryl, helped      Medication List    STOP taking these medications   cyclobenzaprine 10 MG tablet Commonly known as: FLEXERIL     TAKE these medications   prenatal multivitamin Tabs tablet Take 1 tablet by mouth daily at 12 noon.      Activity: activity as tolerated Reviewed self-isolation for 10 days from onset of symptoms or three days beyond resolution of fever, whichever is longer.  Self-monitoring was reviewed along with instructions to seek care if fever/chills, worsening respiratory symptoms, worsening shortness of breath, or diarrhea starts.   Diet: regular diet  Patient to f/u with [facility] after discharge Signed: Faun Mcqueen 07/02/2019 4:24 PM

## 2019-07-02 NOTE — Progress Notes (Signed)
Walking sats revealed patient able to maintain 99% O2 on room air. Well tolerated.  Ellwood Handler, RN 07/02/19 8:17 AM

## 2019-07-02 NOTE — Progress Notes (Signed)
Chart reviewed; B Novelle Addair RN,MHA,BSN  Advanced Care Supervisor [phone] 

## 2019-07-02 NOTE — Discharge Instructions (Signed)
° °Infection Prevention Recommendations for Individuals Confirmed to have, °or Being Evaluated for, 2019 Novel Coronavirus (COVID-19) Infection Who °Receive Care at Home ° °Individuals who are confirmed to have, or are being evaluated for, COVID-19 should follow the prevention steps below °until a healthcare provider or local or state health department says they can return to normal activities. ° °Stay home except to get medical care °You should restrict activities outside your home, except for getting medical care. Do not go to work, school, or public °areas, and do not use public transportation or taxis. ° °Call ahead before visiting your doctor °Before your medical appointment, call the healthcare provider and tell them that you have, or are being evaluated for, °COVID-19 infection. This will help the healthcare provider’s office take steps to keep other people from getting infected. °Ask your healthcare provider to call the local or state health department. ° °Monitor your symptoms °Seek prompt medical attention if your illness is worsening (e.g., difficulty breathing). Before going to your medical °appointment, call the healthcare provider and tell them that you have, or are being evaluated for, COVID-19 infection. Ask °your healthcare provider to call the local or state health department. ° °Wear a facemask °You should wear a facemask that covers your nose and mouth when you are in the same room with other people and °when you visit a healthcare provider. People who live with or visit you should also wear a facemask while they are in the °same room with you. ° °Separate yourself from other people in your home °As much as possible, you should stay in a different room from other people in your home. Also, you should use a separate °bathroom, if available. ° °Avoid sharing household items °You should not share dishes, drinking glasses, cups, eating utensils, towels, bedding, or other items with other people  in °your home. After using these items, you should wash them thoroughly with soap and water. ° °Cover your coughs and sneezes °Cover your mouth and nose with a tissue when you cough or sneeze, or you can cough or sneeze into your sleeve. Throw °used tissues in a lined trash can, and immediately wash your hands with soap and water for at least 20 seconds or use an °alcohol-based hand rub. ° °Wash your hands °Wash your hands often and thoroughly with soap and water for at least 20 seconds. You can use an alcohol-based hand °sanitizer if soap and water are not available and if your hands are not visibly dirty. Avoid touching your eyes, nose, and °mouth with unwashed hands. ° ° °Prevention Steps for Caregivers and Household Members of °Individuals Confirmed to have, or Being Evaluated for, COVID-19 Infection Being Cared for in the Home ° °If you live with, or provide care at home for, a person confirmed to have, or being evaluated for, COVID-19 infection °please follow these guidelines to prevent infection: ° °Follow healthcare provider’s instructions °Make sure that you understand and can help the patient follow any healthcare provider instructions for all care. ° °Provide for the patient’s basic needs °You should help the patient with basic needs in the home and provide support for getting groceries, prescriptions, and °other personal needs. ° °Monitor the patient’s symptoms °If they are getting sicker, call his or her medical provider and tell them that the patient has, or is being evaluated for, °COVID-19 infection. This will help the healthcare provider’s office take steps to keep other people from getting infected. °Ask the healthcare provider to call the local or   state health department. ° °Limit the number of people who have contact with the patient °· If possible, have only one caregiver for the patient. °· Other household members should stay in another home or place of residence. If this is not possible, they  should stay °· in another room, or be separated from the patient as much as possible. Use a separate bathroom, if available. °· Restrict visitors who do not have an essential need to be in the home. ° °Keep older adults, very young children, and other sick people away from the patient °Keep older adults, very young children, and those who have compromised immune systems or chronic health conditions away from the patient. This includes people with chronic heart, lung, or kidney conditions, diabetes, and cancer. ° °Ensure good ventilation °Make sure that shared spaces in the home have good air flow, such as from an air conditioner or an opened window, °weather permitting. ° °Wash your hands often °· Wash your hands often and thoroughly with soap and water for at least 20 seconds. You can use an alcohol based hand sanitizer if soap and water are not available and if your hands are not visibly dirty. °· Avoid touching your eyes, nose, and mouth with unwashed hands. °· Use disposable paper towels to dry your hands. If not available, use dedicated cloth towels and replace them when they become wet. ° °Wear a facemask and gloves °· Wear a disposable facemask at all times in the room and gloves when you touch or have contact with the patient’s blood, body fluids, and/or secretions or excretions, such as sweat, saliva, sputum, nasal mucus, vomit, urine, or feces.  Ensure the mask fits over your nose and mouth tightly, and do not touch it during use. °· Throw out disposable facemasks and gloves after using them. Do not reuse. °· Wash your hands immediately after removing your facemask and gloves. °· If your personal clothing becomes contaminated, carefully remove clothing and launder. Wash your hands after handling contaminated clothing. °· Place all used disposable facemasks, gloves, and other waste in a lined container before disposing them with other household waste. °· Remove gloves and wash your hands immediately after  handling these items. ° °Do not share dishes, glasses, or other household items with the patient °· Avoid sharing household items. You should not share dishes, drinking glasses, cups, eating utensils, towels, bedding, or other items with a patient who is confirmed to have, or being evaluated for, COVID-19 infection. °· After the person uses these items, you should wash them thoroughly with soap and water. ° °Wash laundry thoroughly °· Immediately remove and wash clothes or bedding that have blood, body fluids, and/or secretions or excretions, such as sweat, saliva, sputum, nasal mucus, vomit, urine, or feces, on them. °· Wear gloves when handling laundry from the patient. °· Read and follow directions on labels of laundry or clothing items and detergent. In general, wash and dry with the warmest temperatures recommended on the label. ° °Clean all areas the individual has used often °· Clean all touchable surfaces, such as counters, tabletops, doorknobs, bathroom fixtures, toilets, phones, keyboards, tablets, and bedside tables, every day. Also, clean any surfaces that may have blood, body fluids, and/or secretions or excretions on them. °· Wear gloves when cleaning surfaces the patient has come in contact with. °· Use a diluted bleach solution (e.g., dilute bleach with 1 part bleach and 10 parts water) or a household disinfectant with a label that says EPA-registered for coronaviruses. To   make a bleach solution at home, add 1 tablespoon of bleach to 1 quart (4 cups) of water. For a larger supply, add ¼ cup of bleach to 1 gallon (16 cups) of water. °· Read labels of cleaning products and follow recommendations provided on product labels. Labels contain instructions for safe and effective use of the cleaning product including precautions you should take when applying the product, such as wearing gloves or eye protection and making sure you have good ventilation during use of the product. °· Remove gloves and wash  hands immediately after cleaning. ° °Monitor yourself for signs and symptoms of illness °Caregivers and household members are considered close contacts, should monitor their health, and will be asked to limit °movement outside of the home to the extent possible. Follow the monitoring steps for close contacts listed on the °symptom monitoring form. ° ° °? If you have additional questions, contact your local health department or call the epidemiologist on call at [phone] °(available 24/7). °? This guidance is subject to change. For the most up-to-date guidance from CDC, please refer to their website: °https://www.cdc.gov/coronavirus/2019-ncov/hcp/guidance-prevent-spread.html °

## 2019-07-02 NOTE — Progress Notes (Signed)
NST completed.  Reactive and reassuring for 36 3/[redacted] weeks gestation.  Faculty Practice updated.

## 2019-07-02 NOTE — Progress Notes (Addendum)
[facility] ANTEPARTUM PROGRESS NOTE  Shannon [name] is a 28 y.o. G3P1011 at [redacted]w[redacted]d who is admitted for COVID+ pneumonia.  Estimated Date of Delivery: 07/27/19 Fetal presentation is cephalic.  Length of Stay:  3 Days. Admitted 06/29/2019  Subjective: Patient reports normal fetal movement.  She denies uterine contractions, denies bleeding and leaking of fluid per vagina. She denies shortness of breath and is overall feeling well.  Vitals:  Blood pressure 105/80, pulse (!) 108, temperature 98.9 F (37.2 C), temperature source Oral, resp. rate 18, height 5\' 3"  (1.6 m), weight 99 kg, last menstrual period 10/20/2018, SpO2 99 %. Physical Examination: CONSTITUTIONAL: Well-developed, well-nourished female in no acute distress.  HENT:  Normocephalic, atraumatic, External right and left ear normal. Oropharynx is clear and moist EYES: Conjunctivae and EOM are normal. Pupils are equal, round, and reactive to light. No scleral icterus.  NECK: Normal range of motion, supple, no masses. SKIN: Skin is warm and dry. No rash noted. Not diaphoretic. No erythema. No pallor. [place]: Alert and oriented to person, place, and time. Normal reflexes, muscle tone coordination. No cranial nerve deficit noted. PSYCHIATRIC: Normal mood and affect. Normal behavior. Normal judgment and thought content. CARDIOVASCULAR: Normal heart rate noted RESPIRATORY: Effort normal, no problems with respiration noted MUSCULOSKELETAL: Normal range of motion. No edema and no tenderness. ABDOMEN: Soft, nontender, nondistended, gravid. CERVIX: deferred  Fetal monitoring: NST in process  Results for orders placed or performed during the hospital encounter of 06/29/19 (from the past 48 hour(s))  Glucose, capillary     Status: None   Collection Time: 07/01/19  9:05 AM  Result Value Ref Range   Glucose-Capillary 75 70 - 99 mg/dL  Glucose, capillary     Status: None   Collection Time: 07/02/19  5:51 AM  Result Value Ref Range   Glucose-Capillary 91 70 - 99 mg/dL    I have reviewed the patient's current medications.  ASSESSMENT: Principal Problem:   COVID-19 virus infection Active Problems:   BMI 33.0-33.9,adult   Hypoxemia   Pregnancy and infectious disease in third trimester   SOB (shortness of breath)   Acute viral bronchitis   Tachypnea   PLAN: - NST today (if reactive, okay for discharge) - GBS and GC swabs completed today - For discharged home pending medicine service - Patient to f/u with [facility] after discharge, reviewed importance of isolation, hygiene to prevent transmission of COVID to others, gave instructions for isolation, when to see medical care, etc - reviewed what would happen if she were to require hospital admission prior to 14 days - patient verbalizes understanding of the above   Continue routine antenatal care.   Feliz Beam, M.D. [facility] (Faculty Practice)  07/02/2019 10:13 AM    ADDENDUM  NST reactive per RN Pearletha Forge. Okay for dc from OB standpoint.    Feliz Beam, M.D. [facility] [employer] farm manager)

## 2019-07-02 NOTE — Progress Notes (Signed)
Family Medicine Teaching Service Daily Progress Note Intern Pager: [phone]  Patient name: Shannon [name] Medical record number: [id number] Date of birth: 1991/04/11 Age: 28 y.o. Gender: female  Primary Care Provider: Patient, No Pcp Per Consultants: OB,  MFM Code Status: Full   Pt Overview and Major Events to Date:  Patient admitted to [facility] on 7/3 COVID positive   Assessment and Plan: Shannon A Swiggettis a 28 y.o.femalepresentingpresenting with a 3 day history of malaise, sore throat, headache, dyspnea and productive cough. PMH is significant forprediabetes and major depression.  Acute hypoxic respiratory failure secondary positive COVID- resp status improved  Vital signs stable, was started on 2 L overnight but given oxygen saturations (100 ) charted could have been weaned to room air..  Fetal monitoring has been reassuring so far.  Labs significant for COVID positive, ESR 34, d-dimer 3, CRP 6.7.  OB and MFM consulted with recommendations of holding magnesium sulfate, DVT prophylaxis of heparin 5000 units every 12 hours, positioning in lateral decubitus, intermittent NST, delivery pending maternal status. -OB consulted, appreciate recommendations -MFM consulted, appreciate recommendations  -Continue noninvasive respiratory interventions -RVP ordered but not collected yet -Continue nasal cannula, wean as tolerated -Continuous pulse ox -consult CCM if pt requires 4 L [place] or more -Tylenol PRN -Avoid NSAIDs  Anemia, normocytic-patient has been asymptomatic and so labs not been redrawn Hgb 11.1 on 7/4. Hemoglobin on admission 10.9. Iron studies consistent with iron deficiency anemia. -start ferrous sulfate 325 mg daily -will no longer get labs unless symptomatic   Pregnancy, third trimester-management per OB 28w2d3P1011.  RN charting is shown reassuring values, unable to review strip personally.  -OB following, appreciate recommendations -Intermittent NST -will need GBS between 36  and 37 wks, will defer to OB as they are following   Major depression.History of post partum depression -Continue to monitor -CSW consult at delivery   Prediabetes-Well controlled, CBGs appropriate Glucose 83 on 7/4. 1hr GTT on 5/14 135.Last HbA1C 01/15/19: 5.7. -Monitor CBGs -will no longer get BMP unless symptomatic   FEN/GI: regular diet  PPx: heparin 5000U q12h per MFM  Disposition: Expect discharge July 6 if passes O2 ambulation test and fetal monitoring remains reassuring  Subjective:  Spoke w/ patient via phone.  She says she is breathing fine (still had  in), was very comfortable with going home if cleared medically.  Understands plan to test O2sats w/ ambulation  Objective: Temp:  [97.7 F (36.5 C)-98.7 F (37.1 C)] 98.5 F (36.9 C) (07/05 2000) Pulse Rate:  [91-99] 99 (07/05 2000) Resp:  [16-20] 20 (07/05 2000) BP: (93-115)/(64-70) 93/64 (07/05 2000) SpO2:  [100 %] 100 % (07/05 2000) Physical Exam: Physical exam per FM and OB attending  Laboratory: Recent Labs  Lab 06/29/19 2206 06/30/19 1002  WBC 10.1 9.7  HGB 10.9* 11.1*  HCT 34.4* 34.7*  PLT 407* 409*   Recent Labs  Lab 06/29/19 2206 06/30/19 1002  NA 135 136  K 3.6 3.4*  CL 106 105  CO2 17* 20*  BUN 5* <5*  CREATININE 0.47 0.54  CALCIUM 8.4* 8.5*  GLUCOSE 93 83     Imaging/Diagnostic Tests: No new imaging   BSherene Sires DO 07/02/2019, 5:44 AM PGY-3, [place] pager: [phone] text pages welcome

## 2019-07-03 ENCOUNTER — Other Ambulatory Visit: Payer: Self-pay | Admitting: Certified Nurse Midwife

## 2019-07-03 ENCOUNTER — Telehealth: Payer: Self-pay

## 2019-07-03 LAB — LIPID PANEL WITH LDL/HDL RATIO
Cholesterol, Total: 202 mg/dL — ABNORMAL HIGH (ref 100–199)
HDL: 73 mg/dL (ref >39)
LDL Calculated: 81 mg/dL (ref 0–99)
LDl/HDL Ratio: 1.1 ratio (ref 0.0–3.2)
Triglycerides: 238 mg/dL — ABNORMAL HIGH (ref 0–149)
VLDL Cholesterol Cal: 48 mg/dL — ABNORMAL HIGH (ref 5–40)

## 2019-07-03 LAB — GLUCOSE, RANDOM: Glucose: 100 mg/dL — ABNORMAL HIGH (ref 65–99)

## 2019-07-03 MED ORDER — HYDROCOD POLST-CPM POLST ER 10-8 MG/5ML PO SUER: 5.0000 mL | Freq: Two times a day (BID) | ORAL | 0 refills | Status: DC | PRN

## 2019-07-03 NOTE — Progress Notes (Signed)
Order placed for Tussionex for cough.   Philip Aspen, CNM

## 2019-07-03 NOTE — Telephone Encounter (Signed)
Patient called and stated that she was discharged from the hospital but they did not prescribe anything for her cough. I advised the patient that she should contact her OBGYN to see what they are comfortable with prescribing. She stated that she is still having symptoms and is now having a dry hard cough. She will call her OBGYN to see if they will prescribe something for her cough.Shannon [name]

## 2019-07-04 ENCOUNTER — Encounter: Payer: Self-pay | Admitting: Adult Health

## 2019-07-04 ENCOUNTER — Encounter: Payer: Managed Care, Other (non HMO) | Admitting: Certified Nurse Midwife

## 2019-07-04 LAB — CULTURE, BETA STREP (GROUP B ONLY)

## 2019-07-04 LAB — NOVEL CORONAVIRUS, NAA: SARS-CoV-2, NAA: DETECTED — AB

## 2019-07-04 LAB — CERVICOVAGINAL ANCILLARY ONLY
Chlamydia: NEGATIVE
Neisseria Gonorrhea: NEGATIVE

## 2019-07-09 ENCOUNTER — Telehealth: Payer: Self-pay

## 2019-07-09 DIAGNOSIS — Z8619 Personal history of other infectious and parasitic diseases: Secondary | ICD-10-CM

## 2019-07-09 DIAGNOSIS — Z8616 Personal history of COVID-19: Secondary | ICD-10-CM

## 2019-07-09 NOTE — Telephone Encounter (Signed)
Coronavirus (COVID-19) Are you at risk?  Are you at risk for the Coronavirus (COVID-19)?  To be considered HIGH RISK for Coronavirus (COVID-19), you have to meet the following criteria:  . Traveled to [place], [place], [place], [place] or [place]; or in the [place] to [place], [place], [place], or [place]; and have fever, cough, and shortness of breath within the last 2 weeks of travel OR . Been in close contact with a person diagnosed with COVID-19 within the last 2 weeks and have fever, cough, and shortness of breath . IF YOU DO NOT MEET THESE CRITERIA, YOU ARE CONSIDERED LOW RISK FOR COVID-19.  What to do if you are HIGH RISK for COVID-19?  Marland Kitchen If you are having a medical emergency, call 911. . Seek medical care right away. Before you go to a doctor's office, urgent care or emergency department, call ahead and tell them about your recent travel, contact with someone diagnosed with COVID-19, and your symptoms. You should receive instructions from your physician's office regarding next steps of care.  . When you arrive at healthcare provider, tell the healthcare staff immediately you have returned from visiting [place], [place], [place], [place] or [place]; or traveled in the [place] to [place], [place], [place], or [place]; in the last two weeks or you have been in close contact with a person diagnosed with COVID-19 in the last 2 weeks.   . Tell the health care staff about your symptoms: fever, cough and shortness of breath. . After you have been seen by a medical provider, you will be either: o Tested for (COVID-19) and discharged home on quarantine except to seek medical care if symptoms worsen, and asked to  - Stay home and avoid contact with others until you get your results (4-5 days)  - Avoid travel on public transportation if possible (such as bus, train, or airplane) or o Sent to the Emergency Department by EMS for evaluation, COVID-19 testing, and possible  admission depending on your condition and test results.  What to do if you are LOW RISK for COVID-19?  Reduce your risk of any infection by using the same precautions used for avoiding the common cold or flu:  Marland Kitchen Wash your hands often with soap and warm water for at least 20 seconds.  If soap and water are not readily available, use an alcohol-based hand sanitizer with at least 60% alcohol.  . If coughing or sneezing, cover your mouth and nose by coughing or sneezing into the elbow areas of your shirt or coat, into a tissue or into your sleeve (not your hands). . Avoid shaking hands with others and consider head nods or verbal greetings only. . Avoid touching your eyes, nose, or mouth with unwashed hands.  . Avoid close contact with people who are Shannon [name]. . Avoid places or events with large numbers of people in one location, like concerts or sporting events. . Carefully consider travel plans you have or are making. . If you are planning any travel outside or inside the [place], visit the CDC's Travelers' Health webpage for the latest health notices. . If you have some symptoms but not all symptoms, continue to monitor at home and seek medical attention if your symptoms worsen. . If you are having a medical emergency, call 911.  07/09/19 END OF ISOLATION, NO FEVER X 3 days,PT HAS COUGH SLS ADDITIONAL HEALTHCARE OPTIONS FOR PATIENTS  [facility] / e-Visit: [email or web address]  [facility]: [phone]  [facility]: [phone]                   [facility]: [phone]  

## 2019-07-09 NOTE — Telephone Encounter (Signed)
Order for retest for Covid 19.

## 2019-07-10 ENCOUNTER — Encounter: Payer: Managed Care, Other (non HMO) | Admitting: Certified Nurse Midwife

## 2019-07-10 ENCOUNTER — Observation Stay
Admission: EM | Admit: 2019-07-10 | Discharge: 2019-07-10 | Disposition: A | Discharge status: Home / Self Care | Payer: Managed Care, Other (non HMO) | Attending: Certified Nurse Midwife | Admitting: Certified Nurse Midwife

## 2019-07-10 ENCOUNTER — Other Ambulatory Visit: Payer: Self-pay

## 2019-07-10 DIAGNOSIS — Z5320 Procedure and treatment not carried out because of patient's decision for unspecified reasons: Secondary | ICD-10-CM

## 2019-07-10 DIAGNOSIS — U071 COVID-19: Secondary | ICD-10-CM | POA: Diagnosis not present

## 2019-07-10 DIAGNOSIS — R0902 Hypoxemia: Secondary | ICD-10-CM

## 2019-07-10 DIAGNOSIS — N898 Other specified noninflammatory disorders of vagina: Secondary | ICD-10-CM

## 2019-07-10 DIAGNOSIS — Z3A37 37 weeks gestation of pregnancy: Secondary | ICD-10-CM

## 2019-07-10 DIAGNOSIS — O98913 Unspecified maternal infectious and parasitic disease complicating pregnancy, third trimester: Secondary | ICD-10-CM

## 2019-07-10 DIAGNOSIS — Z0371 Encounter for suspected problem with amniotic cavity and membrane ruled out: Principal | ICD-10-CM | POA: Insufficient documentation

## 2019-07-10 DIAGNOSIS — O98513 Other viral diseases complicating pregnancy, third trimester: Secondary | ICD-10-CM | POA: Insufficient documentation

## 2019-07-10 DIAGNOSIS — O26893 Other specified pregnancy related conditions, third trimester: Secondary | ICD-10-CM | POA: Diagnosis not present

## 2019-07-10 LAB — RUPTURE OF MEMBRANE (ROM)PLUS: Rom Plus: NEGATIVE

## 2019-07-10 LAB — SARS CORONAVIRUS 2 BY RT PCR (HOSPITAL ORDER, PERFORMED IN ~~LOC~~ HOSPITAL LAB): SARS Coronavirus 2: POSITIVE — AB

## 2019-07-10 NOTE — OB Triage Note (Signed)
Pt is a 28 yo G3P1, [redacted]w[redacted]d presents with c/o of leaking of fluid that she noticed around 5:30am. Reports she saw a small amount of dry clear yellow fluid in underwear. Denies decreased fetal movement, vaginal bleeding, states she is not feeling any ctxs. ROM+ collected and sent to lab. Monitors applied and assessing.

## 2019-07-10 NOTE — OB Triage Note (Signed)
@  [email or web address]@  L&D OB Triage Note  SUBJECTIVE Shannon [name] is a 28 y.o. G39P1011 female at [redacted]w[redacted]d, EDD Estimated Date of Delivery: 07/27/19 who presented to triage with complaints of leaking of fluid.   OB History  Gravida Para Term Preterm AB Living  3 1 1  0 1 1  SAB TAB Ectopic Multiple Live Births  1 0 0 0 1    # Outcome Date GA Lbr Len/2nd Weight Sex Delivery Anes PTL Lv  3 Current           2 SAB 2019          1 Term 05/21/16 [redacted]w[redacted]d 302:24 / 01:01 3590 g F Vag-Spont EPI  LIV     Name: SWETHA, RAYLE     Apgar1: 8  Apgar5: 9    Medications Prior to Admission  Medication Sig Dispense Refill Last Dose  . Prenatal Vit-Fe Fumarate-FA (PRENATAL MULTIVITAMIN) TABS tablet Take 1 tablet by mouth daily at 12 noon.   Past Week at Unknown time  . chlorpheniramine-HYDROcodone (TUSSIONEX PENNKINETIC ER) 10-8 MG/5ML SUER Take 5 mLs by mouth every 12 (twelve) hours as needed for cough. 140 mL 0      OBJECTIVE  Nursing Evaluation:   BP (!) 118/51 (BP Location: Right Arm)   Pulse (!) 108   Resp 18   Ht 5\' 3"  (1.6 m)   Wt 101.2 kg   LMP 10/20/2018 (Exact Date)   BMI 39.50 kg/m    Findings:   ROM plus negative  NST was performed and has been reviewed by me.  NST INTERPRETATION: Category I  Mode: External Baseline Rate (A): 150 bpm Variability: Moderate Accelerations: 15 x 15 Decelerations: None     Contraction Frequency (min): UI  ASSESSMENT Impression:  1.  Pregnancy:  G3P1011 at [redacted]w[redacted]d , EDD Estimated Date of Delivery: 07/27/19 2.  NST:  Category I    PLAN 1. Reassurance given 2. Discharge home with standard labor precautions given to return to L&D or call the office for problems. 3. Continue routine prenatal care. Follow up in office .  Philip Aspen, CNM

## 2019-07-17 ENCOUNTER — Telehealth: Payer: Self-pay

## 2019-07-17 NOTE — Telephone Encounter (Signed)
Coronavirus (COVID-19) Are you at risk?  Are you at risk for the Coronavirus (COVID-19)?  To be considered HIGH RISK for Coronavirus (COVID-19), you have to meet the following criteria:  . Traveled to [place], [place], [place], [place] or [place]; or in the [place] to [place], [place], [place], or [place]; and have fever, cough, and shortness of breath within the last 2 weeks of travel OR . Been in close contact with a person diagnosed with COVID-19 within the last 2 weeks and have fever, cough, and shortness of breath . IF YOU DO NOT MEET THESE CRITERIA, YOU ARE CONSIDERED LOW RISK FOR COVID-19.  What to do if you are HIGH RISK for COVID-19?  . If you are having a medical emergency, call 911. . Seek medical care right away. Before you go to a doctor's office, urgent care or emergency department, call ahead and tell them about your recent travel, contact with someone diagnosed with COVID-19, and your symptoms. You should receive instructions from your physician's office regarding next steps of care.  . When you arrive at healthcare provider, tell the healthcare staff immediately you have returned from visiting [place], [place], [place], [place] or [place]; or traveled in the [place] to [place], [place], [place], or [place]; in the last two weeks or you have been in close contact with a person diagnosed with COVID-19 in the last 2 weeks.   . Tell the health care staff about your symptoms: fever, cough and shortness of breath. . After you have been seen by a medical provider, you will be either: o Tested for (COVID-19) and discharged home on quarantine except to seek medical care if symptoms worsen, and asked to  - Stay home and avoid contact with others until you get your results (4-5 days)  - Avoid travel on public transportation if possible (such as bus, train, or airplane) or o Sent to the Emergency Department by EMS for evaluation, COVID-19 testing, and possible  admission depending on your condition and test results.  What to do if you are LOW RISK for COVID-19?  Reduce your risk of any infection by using the same precautions used for avoiding the common cold or flu:  . Wash your hands often with soap and warm water for at least 20 seconds.  If soap and water are not readily available, use an alcohol-based hand sanitizer with at least 60% alcohol.  . If coughing or sneezing, cover your mouth and nose by coughing or sneezing into the elbow areas of your shirt or coat, into a tissue or into your sleeve (not your hands). . Avoid shaking hands with others and consider head nods or verbal greetings only. . Avoid touching your eyes, nose, or mouth with unwashed hands.  . Avoid close contact with people who are Jasline Buskirk. . Avoid places or events with large numbers of people in one location, like concerts or sporting events. . Carefully consider travel plans you have or are making. . If you are planning any travel outside or inside the [place], visit the CDC's Travelers' Health webpage for the latest health notices. . If you have some symptoms but not all symptoms, continue to monitor at home and seek medical attention if your symptoms worsen. . If you are having a medical emergency, call 911.  07/17/19 SCREENING NEG SLS ADDITIONAL HEALTHCARE OPTIONS FOR PATIENTS  [facility] / e-Visit: [email or web address]         [facility]: [phone]    [facility]: [phone]                   [facility]: [phone]  

## 2019-07-18 ENCOUNTER — Observation Stay
Admission: EM | Admit: 2019-07-18 | Discharge: 2019-07-18 | Disposition: A | Discharge status: Home / Self Care | Payer: Managed Care, Other (non HMO) | Attending: Certified Nurse Midwife | Admitting: Certified Nurse Midwife

## 2019-07-18 ENCOUNTER — Ambulatory Visit (INDEPENDENT_AMBULATORY_CARE_PROVIDER_SITE_OTHER): Payer: Managed Care, Other (non HMO) | Admitting: Certified Nurse Midwife

## 2019-07-18 ENCOUNTER — Other Ambulatory Visit: Payer: Self-pay

## 2019-07-18 VITALS — BP 104/66 | HR 93 | Wt 217.6 lb

## 2019-07-18 DIAGNOSIS — O471 False labor at or after 37 completed weeks of gestation: Secondary | ICD-10-CM | POA: Diagnosis not present

## 2019-07-18 DIAGNOSIS — O98913 Unspecified maternal infectious and parasitic disease complicating pregnancy, third trimester: Secondary | ICD-10-CM

## 2019-07-18 DIAGNOSIS — Z0371 Encounter for suspected problem with amniotic cavity and membrane ruled out: Principal | ICD-10-CM | POA: Insufficient documentation

## 2019-07-18 DIAGNOSIS — Z3480 Encounter for supervision of other normal pregnancy, unspecified trimester: Secondary | ICD-10-CM

## 2019-07-18 DIAGNOSIS — R0902 Hypoxemia: Secondary | ICD-10-CM

## 2019-07-18 DIAGNOSIS — Z3A38 38 weeks gestation of pregnancy: Secondary | ICD-10-CM | POA: Diagnosis not present

## 2019-07-18 DIAGNOSIS — U071 COVID-19: Secondary | ICD-10-CM

## 2019-07-18 DIAGNOSIS — Z5320 Procedure and treatment not carried out because of patient's decision for unspecified reasons: Secondary | ICD-10-CM

## 2019-07-18 LAB — POCT URINALYSIS DIPSTICK OB
Bilirubin, UA: NEGATIVE
Blood, UA: NEGATIVE
Glucose, UA: NEGATIVE
Ketones, UA: NEGATIVE
Leukocytes, UA: NEGATIVE
Nitrite, UA: NEGATIVE
POC,PROTEIN,UA: NEGATIVE
Spec Grav, UA: <=1.005 — AB (ref 1.010–1.025)
Urobilinogen, UA: 0.2 E.U./dL
pH, UA: 7 (ref 5.0–8.0)

## 2019-07-18 LAB — RUPTURE OF MEMBRANE (ROM)PLUS: Rom Plus: NEGATIVE

## 2019-07-18 NOTE — Patient Instructions (Signed)
Braxton Hicks Contractions Contractions of the uterus can occur throughout pregnancy, but they are not always a sign that you are in labor. You may have practice contractions called Braxton Hicks contractions. These false labor contractions are sometimes confused with true labor. What are Braxton Hicks contractions? Braxton Hicks contractions are tightening movements that occur in the muscles of the uterus before labor. Unlike true labor contractions, these contractions do not result in opening (dilation) and thinning of the cervix. Toward the end of pregnancy (32-34 weeks), Braxton Hicks contractions can happen more often and may become stronger. These contractions are sometimes difficult to tell apart from true labor because they can be very uncomfortable. You should not feel embarrassed if you go to the hospital with false labor. Sometimes, the only way to tell if you are in true labor is for your health care provider to look for changes in the cervix. The health care provider will do a physical exam and may monitor your contractions. If you are not in true labor, the exam should show that your cervix is not dilating and your water has not broken. If there are no other health problems associated with your pregnancy, it is completely safe for you to be sent home with false labor. You may continue to have Braxton Hicks contractions until you go into true labor. How to tell the difference between true labor and false labor True labor  Contractions last 30-70 seconds.  Contractions become very regular.  Discomfort is usually felt in the top of the uterus, and it spreads to the lower abdomen and low back.  Contractions do not go away with walking.  Contractions usually become more intense and increase in frequency.  The cervix dilates and gets thinner. False labor  Contractions are usually shorter and not as strong as true labor contractions.  Contractions are usually irregular.  Contractions  are often felt in the front of the lower abdomen and in the groin.  Contractions may go away when you walk around or change positions while lying down.  Contractions get weaker and are shorter-lasting as time goes on.  The cervix usually does not dilate or become thin. Follow these instructions at home:   Take over-the-counter and prescription medicines only as told by your health care provider.  Keep up with your usual exercises and follow other instructions from your health care provider.  Eat and drink lightly if you think you are going into labor.  If Braxton Hicks contractions are making you uncomfortable: ? Change your position from lying down or resting to walking, or change from walking to resting. ? Sit and rest in a tub of warm water. ? Drink enough fluid to keep your urine pale yellow. Dehydration may cause these contractions. ? Do slow and deep breathing several times an hour.  Keep all follow-up prenatal visits as told by your health care provider. This is important. Contact a health care provider if:  You have a fever.  You have continuous pain in your abdomen. Get help right away if:  Your contractions become stronger, more regular, and closer together.  You have fluid leaking or gushing from your vagina.  You pass blood-tinged mucus (bloody show).  You have bleeding from your vagina.  You have low back pain that you never had before.  You feel your baby's head pushing down and causing pelvic pressure.  Your baby is not moving inside you as much as it used to. Summary  Contractions that occur before labor are   called Braxton Hicks contractions, false labor, or practice contractions.  Braxton Hicks contractions are usually shorter, weaker, farther apart, and less regular than true labor contractions. True labor contractions usually become progressively stronger and regular, and they become more frequent.  Manage discomfort from Braxton Hicks contractions  by changing position, resting in a warm bath, drinking plenty of water, or practicing deep breathing. This information is not intended to replace advice given to you by your health care provider. Make sure you discuss any questions you have with your health care provider. Document Released: 04/28/2017 Document Revised: 11/25/2017 Document Reviewed: 04/28/2017 Elsevier Patient Education  2020 Elsevier Inc.  

## 2019-07-18 NOTE — Progress Notes (Signed)
ROB doing well. Has no cough or symptoms remaining from covid. She feels good movement and complains of lots of pelvic pressure. SVE 3-4 cm/70/-2. Labor precautions reviewed. Follow up 1 wk for BPP/growth u/s and ROB with Melody  Philip Aspen, CNM

## 2019-07-18 NOTE — Addendum Note (Signed)
Addended by: Raliegh Ip on: 07/18/2019 02:10 PM   Modules accepted: Orders

## 2019-07-18 NOTE — OB Triage Note (Signed)
  L&D OB Triage Note  SUBJECTIVE Shannon [name] is a 28 y.o. G55P1011 female at [redacted]w[redacted]d, EDD Estimated Date of Delivery: 07/27/19 who presented to triage with complaints of irregular contractions and leaking of fluid.SHe denies vaginal bleeding and feels good movement .   OB History  Gravida Para Term Preterm AB Living  3 1 1  0 1 1  SAB TAB Ectopic Multiple Live Births  1 0 0 0 1    # Outcome Date GA Lbr Len/2nd Weight Sex Delivery Anes PTL Lv  3 Current           2 SAB 2019          1 Term 05/21/16 [redacted]w[redacted]d 302:24 / 01:01 3590 g F Vag-Spont EPI  LIV     Name: OLUWASEMILORE, PASCUZZI     Apgar1: 8  Apgar5: 9    Medications Prior to Admission  Medication Sig Dispense Refill Last Dose  . Prenatal Vit-Fe Fumarate-FA (PRENATAL MULTIVITAMIN) TABS tablet Take 1 tablet by mouth daily at 12 noon.   07/18/2019 at Unknown time     OBJECTIVE  Nursing Evaluation:   BP 120/70   Pulse 100   Temp 98.3 F (36.8 C) (Oral)   Resp 16   Ht 5\' 3"  (1.6 m)   Wt 98.4 kg   LMP 10/20/2018 (Exact Date)   BMI 38.44 kg/m    Findings:   ROM plus negative  NST was performed and has been reviewed by me.  NST INTERPRETATION: Category I  Mode: External Baseline Rate (A): 140 bpm Variability: Moderate Accelerations: 15 x 15 Decelerations: Variable     Contraction Frequency (min): 4-10  ASSESSMENT Impression:  1.  Pregnancy:  G3P1011 at [redacted]w[redacted]d , EDD Estimated Date of Delivery: 07/27/19 2.  NST:  Category I 3. No cervical change since office visit early in the day.  PLAN 1. Reassurance given 2. Discharge home with standard labor precautions given to return to L&D or call the office for problems. 3. Continue routine prenatal care.  Philip Aspen, CNM

## 2019-07-18 NOTE — OB Triage Note (Signed)
Pt reports to unit c/o ctx every 5-7 minutes since her membranes were swept in the office earlier today. Pt al.so reports LOF that began around 2000 this afternoon. Pt reports positive fetal movement, denies vaginal bleeding. Vital signs WDL. Initial FHT  151. Will continue to monitor.

## 2019-07-23 ENCOUNTER — Other Ambulatory Visit: Payer: Self-pay

## 2019-07-23 ENCOUNTER — Observation Stay
Admission: EM | Admit: 2019-07-23 | Discharge: 2019-07-23 | Disposition: A | Discharge status: Home / Self Care | Payer: Managed Care, Other (non HMO) | Source: Home / Self Care | Admitting: Obstetrics and Gynecology

## 2019-07-23 DIAGNOSIS — Z3490 Encounter for supervision of normal pregnancy, unspecified, unspecified trimester: Secondary | ICD-10-CM

## 2019-07-23 DIAGNOSIS — O98513 Other viral diseases complicating pregnancy, third trimester: Secondary | ICD-10-CM | POA: Insufficient documentation

## 2019-07-23 DIAGNOSIS — U071 COVID-19: Secondary | ICD-10-CM

## 2019-07-23 DIAGNOSIS — R0902 Hypoxemia: Secondary | ICD-10-CM

## 2019-07-23 DIAGNOSIS — Z5320 Procedure and treatment not carried out because of patient's decision for unspecified reasons: Secondary | ICD-10-CM

## 2019-07-23 DIAGNOSIS — Z3A39 39 weeks gestation of pregnancy: Secondary | ICD-10-CM | POA: Insufficient documentation

## 2019-07-23 DIAGNOSIS — O98913 Unspecified maternal infectious and parasitic disease complicating pregnancy, third trimester: Secondary | ICD-10-CM

## 2019-07-23 NOTE — Discharge Instructions (Signed)
Fetal Movement Counts Patient Name: ________________________________________________ Patient Due Date: ____________________ What is a fetal movement count?  A fetal movement count is the number of times that you feel your baby move during a certain amount of time. This may also be called a fetal kick count. A fetal movement count is recommended for every pregnant woman. You may be asked to start counting fetal movements as early as week 28 of your pregnancy. Pay attention to when your baby is most active. You may notice your baby's sleep and wake cycles. You may also notice things that make your baby move more. You should do a fetal movement count:  When your baby is normally most active.  At the same time each day. A good time to count movements is while you are resting, after having something to eat and drink. How do I count fetal movements? 1. Find a quiet, comfortable area. Sit, or lie down on your side. 2. Write down the date, the start time and stop time, and the number of movements that you felt between those two times. Take this information with you to your health care visits. 3. For 2 hours, count kicks, flutters, swishes, rolls, and jabs. You should feel at least 10 movements during 2 hours. 4. You may stop counting after you have felt 10 movements. 5. If you do not feel 10 movements in 2 hours, have something to eat and drink. Then, keep resting and counting for 1 hour. If you feel at least 4 movements during that hour, you may stop counting. Contact a health care provider if:  You feel fewer than 4 movements in 2 hours.  Your baby is not moving like he or she usually does. Date: ____________ Start time: ____________ Stop time: ____________ Movements: ____________ Date: ____________ Start time: ____________ Stop time: ____________ Movements: ____________ Date: ____________ Start time: ____________ Stop time: ____________ Movements: ____________ Date: ____________ Start time:  ____________ Stop time: ____________ Movements: ____________ Date: ____________ Start time: ____________ Stop time: ____________ Movements: ____________ Date: ____________ Start time: ____________ Stop time: ____________ Movements: ____________ Date: ____________ Start time: ____________ Stop time: ____________ Movements: ____________ Date: ____________ Start time: ____________ Stop time: ____________ Movements: ____________ Date: ____________ Start time: ____________ Stop time: ____________ Movements: ____________ This information is not intended to replace advice given to you by your health care provider. Make sure you discuss any questions you have with your health care provider. Document Released: 01/12/2007 Document Revised: 01/02/2019 Document Reviewed: 01/22/2016 Elsevier Patient Education  2020 Elsevier Inc. Braxton Hicks Contractions Contractions of the uterus can occur throughout pregnancy, but they are not always a sign that you are in labor. You may have practice contractions called Braxton Hicks contractions. These false labor contractions are sometimes confused with true labor. What are Braxton Hicks contractions? Braxton Hicks contractions are tightening movements that occur in the muscles of the uterus before labor. Unlike true labor contractions, these contractions do not result in opening (dilation) and thinning of the cervix. Toward the end of pregnancy (32-34 weeks), Braxton Hicks contractions can happen more often and may become stronger. These contractions are sometimes difficult to tell apart from true labor because they can be very uncomfortable. You should not feel embarrassed if you go to the hospital with false labor. Sometimes, the only way to tell if you are in true labor is for your health care provider to look for changes in the cervix. The health care provider will do a physical exam and may monitor your contractions. If you   are not in true labor, the exam should show  that your cervix is not dilating and your water has not broken. If there are no other health problems associated with your pregnancy, it is completely safe for you to be sent home with false labor. You may continue to have Braxton Hicks contractions until you go into true labor. How to tell the difference between true labor and false labor True labor  Contractions last 30-70 seconds.  Contractions become very regular.  Discomfort is usually felt in the top of the uterus, and it spreads to the lower abdomen and low back.  Contractions do not go away with walking.  Contractions usually become more intense and increase in frequency.  The cervix dilates and gets thinner. False labor  Contractions are usually shorter and not as strong as true labor contractions.  Contractions are usually irregular.  Contractions are often felt in the front of the lower abdomen and in the groin.  Contractions may go away when you walk around or change positions while lying down.  Contractions get weaker and are shorter-lasting as time goes on.  The cervix usually does not dilate or become thin. Follow these instructions at home:   Take over-the-counter and prescription medicines only as told by your health care provider.  Keep up with your usual exercises and follow other instructions from your health care provider.  Eat and drink lightly if you think you are going into labor.  If Braxton Hicks contractions are making you uncomfortable: ? Change your position from lying down or resting to walking, or change from walking to resting. ? Sit and rest in a tub of warm water. ? Drink enough fluid to keep your urine pale yellow. Dehydration may cause these contractions. ? Do slow and deep breathing several times an hour.  Keep all follow-up prenatal visits as told by your health care provider. This is important. Contact a health care provider if:  You have a fever.  You have continuous pain in  your abdomen. Get help right away if:  Your contractions become stronger, more regular, and closer together.  You have fluid leaking or gushing from your vagina.  You pass blood-tinged mucus (bloody show).  You have bleeding from your vagina.  You have low back pain that you never had before.  You feel your baby's head pushing down and causing pelvic pressure.  Your baby is not moving inside you as much as it used to. Summary  Contractions that occur before labor are called Braxton Hicks contractions, false labor, or practice contractions.  Braxton Hicks contractions are usually shorter, weaker, farther apart, and less regular than true labor contractions. True labor contractions usually become progressively stronger and regular, and they become more frequent.  Manage discomfort from Braxton Hicks contractions by changing position, resting in a warm bath, drinking plenty of water, or practicing deep breathing. This information is not intended to replace advice given to you by your health care provider. Make sure you discuss any questions you have with your health care provider. Document Released: 04/28/2017 Document Revised: 11/25/2017 Document Reviewed: 04/28/2017 Elsevier Patient Education  2020 Elsevier Inc.  

## 2019-07-23 NOTE — OB Triage Note (Signed)
Pt is a G3P1 seen at 39wk3d w/ c/o of "just not feeling well and pain in hands and feet." Pt denies LOF, Vaginal bleeding, and states positive fetal movement. Pt states she feels a lot of vaginal pressure and irritation. FHT 147. Monitors applied and assessing.

## 2019-07-25 ENCOUNTER — Ambulatory Visit (INDEPENDENT_AMBULATORY_CARE_PROVIDER_SITE_OTHER): Payer: Managed Care, Other (non HMO)

## 2019-07-25 ENCOUNTER — Inpatient Hospital Stay
Admission: EM | Admit: 2019-07-25 | Discharge: 2019-07-28 | DRG: 805 | Disposition: A | Discharge status: Home / Self Care | Payer: Managed Care, Other (non HMO) | Attending: Certified Nurse Midwife | Admitting: Certified Nurse Midwife

## 2019-07-25 ENCOUNTER — Inpatient Hospital Stay: Payer: Managed Care, Other (non HMO) | Admitting: Anesthesiology

## 2019-07-25 ENCOUNTER — Other Ambulatory Visit: Payer: Self-pay

## 2019-07-25 ENCOUNTER — Other Ambulatory Visit: Payer: Self-pay | Admitting: Certified Nurse Midwife

## 2019-07-25 ENCOUNTER — Encounter: Payer: Self-pay | Admitting: Certified Nurse Midwife

## 2019-07-25 ENCOUNTER — Ambulatory Visit (INDEPENDENT_AMBULATORY_CARE_PROVIDER_SITE_OTHER): Payer: Managed Care, Other (non HMO) | Admitting: Certified Nurse Midwife

## 2019-07-25 DIAGNOSIS — O99824 Streptococcus B carrier state complicating childbirth: Secondary | ICD-10-CM | POA: Diagnosis present

## 2019-07-25 DIAGNOSIS — O480 Post-term pregnancy: Secondary | ICD-10-CM | POA: Diagnosis not present

## 2019-07-25 DIAGNOSIS — Z3A39 39 weeks gestation of pregnancy: Secondary | ICD-10-CM | POA: Diagnosis not present

## 2019-07-25 DIAGNOSIS — O9852 Other viral diseases complicating childbirth: Secondary | ICD-10-CM | POA: Diagnosis present

## 2019-07-25 DIAGNOSIS — Z3480 Encounter for supervision of other normal pregnancy, unspecified trimester: Secondary | ICD-10-CM

## 2019-07-25 DIAGNOSIS — U071 COVID-19: Secondary | ICD-10-CM | POA: Diagnosis present

## 2019-07-25 DIAGNOSIS — Z3493 Encounter for supervision of normal pregnancy, unspecified, third trimester: Secondary | ICD-10-CM

## 2019-07-25 DIAGNOSIS — O99820 Streptococcus B carrier state complicating pregnancy: Secondary | ICD-10-CM

## 2019-07-25 DIAGNOSIS — O4103X0 Oligohydramnios, third trimester, not applicable or unspecified: Principal | ICD-10-CM

## 2019-07-25 LAB — CBC
HCT: 34.3 % — ABNORMAL LOW (ref 36.0–46.0)
Hemoglobin: 11 g/dL — ABNORMAL LOW (ref 12.0–15.0)
MCH: 25.4 pg — ABNORMAL LOW (ref 26.0–34.0)
MCHC: 32.1 g/dL (ref 30.0–36.0)
MCV: 79.2 fL — ABNORMAL LOW (ref 80.0–100.0)
Platelets: 376 10*3/uL (ref 150–400)
RBC: 4.33 MIL/uL (ref 3.87–5.11)
RDW: 15.7 % — ABNORMAL HIGH (ref 11.5–15.5)
WBC: 11.1 10*3/uL — ABNORMAL HIGH (ref 4.0–10.5)
nRBC: 0 % (ref 0.0–0.2)

## 2019-07-25 LAB — TYPE AND SCREEN
ABO/RH(D): O POS
Antibody Screen: NEGATIVE

## 2019-07-25 LAB — SARS CORONAVIRUS 2 BY RT PCR (HOSPITAL ORDER, PERFORMED IN ~~LOC~~ HOSPITAL LAB): SARS Coronavirus 2: POSITIVE — AB

## 2019-07-25 MED ORDER — PHENYLEPHRINE 40 MCG/ML (10ML) SYRINGE FOR IV PUSH (FOR BLOOD PRESSURE SUPPORT): 80.0000 ug | PREFILLED_SYRINGE | INTRAVENOUS | Status: DC | PRN

## 2019-07-25 MED ORDER — OXYTOCIN 40 UNITS IN NORMAL SALINE INFUSION - SIMPLE MED
1.0000 m[IU]/min | INTRAVENOUS | Status: DC
  Administered 2019-07-25: 4 m[IU]/min via INTRAVENOUS
  Filled 2019-07-25: qty 1000

## 2019-07-25 MED ORDER — PENICILLIN G 3 MILLION UNITS IVPB - SIMPLE MED
3.0000 10*6.[IU] | INTRAVENOUS | Status: DC
  Administered 2019-07-26: 3 10*6.[IU] via INTRAVENOUS
  Filled 2019-07-25: qty 100

## 2019-07-25 MED ORDER — LIDOCAINE HCL (PF) 1 % IJ SOLN: 30.0000 mL | INTRAMUSCULAR | Status: DC | PRN

## 2019-07-25 MED ORDER — DIPHENHYDRAMINE HCL 50 MG/ML IJ SOLN: 12.5000 mg | INTRAMUSCULAR | Status: DC | PRN

## 2019-07-25 MED ORDER — ONDANSETRON HCL 4 MG/2ML IJ SOLN
4.0000 mg | Freq: Four times a day (QID) | INTRAMUSCULAR | Status: DC | PRN
  Administered 2019-07-26: 4 mg via INTRAVENOUS
  Filled 2019-07-25: qty 2

## 2019-07-25 MED ORDER — SOD CITRATE-CITRIC ACID 500-334 MG/5ML PO SOLN: 30.0000 mL | ORAL | Status: DC | PRN

## 2019-07-25 MED ORDER — LACTATED RINGERS IV SOLN: 500.0000 mL | Freq: Once | INTRAVENOUS | Status: DC

## 2019-07-25 MED ORDER — LACTATED RINGERS IV SOLN: 500.0000 mL | INTRAVENOUS | Status: DC | PRN

## 2019-07-25 MED ORDER — AMMONIA AROMATIC IN INHA
RESPIRATORY_TRACT | Status: AC
  Filled 2019-07-25: qty 10

## 2019-07-25 MED ORDER — MISOPROSTOL 200 MCG PO TABS
ORAL_TABLET | ORAL | Status: AC
  Filled 2019-07-25: qty 4

## 2019-07-25 MED ORDER — FENTANYL 2.5 MCG/ML W/ROPIVACAINE 0.15% IN NS 100 ML EPIDURAL (ARMC)
EPIDURAL | Status: AC
  Filled 2019-07-25: qty 100

## 2019-07-25 MED ORDER — SODIUM CHLORIDE 0.9 % IV SOLN
5.0000 10*6.[IU] | Freq: Once | INTRAVENOUS | Status: AC
  Administered 2019-07-25: 5 10*6.[IU] via INTRAVENOUS
  Filled 2019-07-25: qty 5

## 2019-07-25 MED ORDER — TERBUTALINE SULFATE 1 MG/ML IJ SOLN: 0.2500 mg | Freq: Once | INTRAMUSCULAR | Status: DC | PRN

## 2019-07-25 MED ORDER — OXYTOCIN BOLUS FROM INFUSION
500.0000 mL | Freq: Once | INTRAVENOUS | Status: AC
  Administered 2019-07-26: 500 mL via INTRAVENOUS

## 2019-07-25 MED ORDER — LIDOCAINE HCL (PF) 1 % IJ SOLN
INTRAMUSCULAR | Status: AC
  Filled 2019-07-25: qty 30

## 2019-07-25 MED ORDER — OXYTOCIN 40 UNITS IN NORMAL SALINE INFUSION - SIMPLE MED
INTRAVENOUS | Status: AC
  Administered 2019-07-25: 4 m[IU]/min via INTRAVENOUS
  Filled 2019-07-25: qty 1000

## 2019-07-25 MED ORDER — OXYTOCIN 10 UNIT/ML IJ SOLN
INTRAMUSCULAR | Status: AC
  Filled 2019-07-25: qty 2

## 2019-07-25 MED ORDER — EPHEDRINE 5 MG/ML INJ: 10.0000 mg | INTRAVENOUS | Status: DC | PRN

## 2019-07-25 MED ORDER — ACETAMINOPHEN 325 MG PO TABS: 650.0000 mg | ORAL_TABLET | ORAL | Status: DC | PRN

## 2019-07-25 MED ORDER — BUTORPHANOL TARTRATE 2 MG/ML IJ SOLN: 1.0000 mg | INTRAMUSCULAR | Status: DC | PRN

## 2019-07-25 MED ORDER — FENTANYL 2.5 MCG/ML W/ROPIVACAINE 0.15% IN NS 100 ML EPIDURAL (ARMC)
12.0000 mL/h | EPIDURAL | Status: DC
  Administered 2019-07-26: 12 mL/h via EPIDURAL

## 2019-07-25 MED ORDER — OXYTOCIN 40 UNITS IN NORMAL SALINE INFUSION - SIMPLE MED
2.5000 [IU]/h | INTRAVENOUS | Status: DC
  Administered 2019-07-26: 2.5 [IU]/h via INTRAVENOUS

## 2019-07-25 MED ORDER — LACTATED RINGERS IV SOLN
INTRAVENOUS | Status: DC
  Administered 2019-07-25 – 2019-07-26 (×2): via INTRAVENOUS

## 2019-07-25 NOTE — H&P (Signed)
History and Physical   HPI  Shannon [name] is a 28 y.o. G3P1011 at 2517w5d Estimated Date of Delivery: 07/27/19 who is being admitted for Induction due to Oligohydramnios. BPP 6/8 in office today with AFI 4cm.   OB History  OB History  Gravida Para Term Preterm AB Living  3 1 1  0 1 1  SAB TAB Ectopic Multiple Live Births  1 0 0 0 1    # Outcome Date GA Lbr Len/2nd Weight Sex Delivery Anes PTL Lv  3 Current           2 SAB 2019          1 Term 05/21/16 6617w5d 302:24 / 01:01 3590 g F Vag-Spont EPI  LIV     Name: Shannon RushingPETE,GIRL Docia     Apgar1: 8  Apgar5: 9    PROBLEM LIST  Pregnancy complications or risks: Patient Active Problem List   Diagnosis Date Noted  . Pregnancy 07/23/2019  . Labor and delivery, indication for care 07/10/2019  . Acute viral bronchitis 06/30/2019  . Tachypnea 06/30/2019  . COVID-19 virus infection 06/29/2019  . Hypoxemia 06/29/2019  . Pregnancy and infectious disease in third trimester 06/29/2019  . SOB (shortness of breath) 06/29/2019  . Prediabetes 05/10/2019  . Antenatal screening for HIV declined 05/10/2019  . History of preterm labor 05/10/2019  . Abnormal biochemical finding on antenatal screening of mother   . History of miscarriage 01/15/2019  . Contusion of left hand 03/07/2018  . Pain of left hand 03/07/2018  . Major depression 07/27/2017  . Pelvic pain 07/27/2017  . Obesity (BMI 30.0-34.9) 07/27/2017  . BMI 33.0-33.9,adult 05/20/2016  . Migraine without aura and without status migrainosus, not intractable 09/26/2014    Prenatal labs and studies: ABO, Rh: --/--/PENDING (07/29 1810) Antibody: PENDING (07/29 1810) Rubella: 7.03 (01/20 1454) RPR: Non Reactive (07/03 2305)  HBsAg: Negative (01/20 1454)  HIV: NON REACTIVE (07/03 2305)  GBS: Postive   Past Medical History:  Diagnosis Date  . Anxiety   . Depression   . Migraines   . Post partum depression      Past Surgical History:  Procedure Laterality Date  .  CHOLECYSTECTOMY    . FOOT SURGERY Left   . KNEE SURGERY Left      Medications    Current Discharge Medication List    CONTINUE these medications which have NOT CHANGED   Details  Prenatal Vit-Fe Fumarate-FA (PRENATAL MULTIVITAMIN) TABS tablet Take 1 tablet by mouth daily at 12 noon.         Allergies  Robitussin (alcohol free) [guaifenesin]  Review of Systems  Constitutional: negative Eyes: negative Ears, nose, mouth, throat, and face: negative Respiratory: negative Cardiovascular: negative Gastrointestinal: negative Genitourinary:negative Integument/breast: negative Hematologic/lymphatic: negative Musculoskeletal:negative Neurological: negative Behavioral/Psych: negative Endocrine: negative Allergic/Immunologic: negative  Physical Exam  BP 125/74 (BP Location: Left Arm)   Pulse (!) 101   Temp 98.7 F (37.1 C) (Oral)   Resp 18   Ht 5\' 3"  (1.6 m)   Wt 100.7 kg   LMP 10/20/2018 (Exact Date)   BMI 39.33 kg/m   Lungs:  CTA B Cardio: RRR  Abd: Soft, gravid, NT Presentation: cephalic EXT: No C/C/ 1+ Edema DTRs: 2+ B CERVIX:     See Prenatal records for more detailed PE.     FHR:  Baseline: 145 bpm, Variability: Good {> 6 bpm), Accelerations: Reactive and Decelerations: Absent  Toco: Uterine Contractions: irregular   Test Results  Results for orders placed or  performed during the hospital encounter of 07/25/19 (from the past 24 hour(s))  CBC     Status: Abnormal   Collection Time: 07/25/19  6:10 PM  Result Value Ref Range   WBC 11.1 (H) 4.0 - 10.5 K/uL   RBC 4.33 3.87 - 5.11 MIL/uL   Hemoglobin 11.0 (L) 12.0 - 15.0 g/dL   HCT 34.3 (L) 36.0 - 46.0 %   MCV 79.2 (L) 80.0 - 100.0 fL   MCH 25.4 (L) 26.0 - 34.0 pg   MCHC 32.1 30.0 - 36.0 g/dL   RDW 15.7 (H) 11.5 - 15.5 %   Platelets 376 150 - 400 K/uL   nRBC 0.0 0.0 - 0.2 %  Type and screen     Status: None (Preliminary result)   Collection Time: 07/25/19  6:10 PM  Result Value Ref Range    ABO/RH(D) PENDING    Antibody Screen PENDING    Sample Expiration      07/28/2019,2359 Performed at [facility], [address] [place], [place] [address]    Group B Strep positive, Other: covid positive this pregnancy   Assessment   G3P1011 at [redacted]w[redacted]d Estimated Date of Delivery: 07/27/19  The fetus is reassuring.    Patient Active Problem List   Diagnosis Date Noted  . Pregnancy 07/23/2019  . Labor and delivery, indication for care 07/10/2019  . Acute viral bronchitis 06/30/2019  . Tachypnea 06/30/2019  . COVID-19 virus infection 06/29/2019  . Hypoxemia 06/29/2019  . Pregnancy and infectious disease in third trimester 06/29/2019  . SOB (shortness of breath) 06/29/2019  . Prediabetes 05/10/2019  . Antenatal screening for HIV declined 05/10/2019  . History of preterm labor 05/10/2019  . Abnormal biochemical finding on antenatal screening of mother   . History of miscarriage 01/15/2019  . Contusion of left hand 03/07/2018  . Pain of left hand 03/07/2018  . Major depression 07/27/2017  . Pelvic pain 07/27/2017  . Obesity (BMI 30.0-34.9) 07/27/2017  . BMI 33.0-33.9,adult 05/20/2016  . Migraine without aura and without status migrainosus, not intractable 09/26/2014    Plan  1. Admit to L&D :   IV Pitocin induction 2. EFM:-- Category 1 3. Epidural if desired.  Stadol for IV pain until epidural requested. 4. Admission labs  5.Anticipate NSVD  Philip Aspen, CNM  07/25/2019 6:35 PM

## 2019-07-25 NOTE — Progress Notes (Signed)
LABOR NOTE   Shannon [name] 28 y.o.@ at [redacted]w[redacted]d Early latent labor.  SUBJECTIVE:  Feeling some mild contractions OBJECTIVE:  BP 122/73 (BP Location: Left Arm)   Pulse 98   Temp 97.8 F (36.6 C) (Oral)   Resp 16   Ht 5\' 3"  (1.6 m)   Wt 100.7 kg   LMP 10/20/2018 (Exact Date)   BMI 39.33 kg/m  No intake/output data recorded.  She has shown cervical change. CERVIX: 5cm:  70%:   -2:   posterior:   firm SVE:     CONTRACTIONS: irregular, every 2-4  minutes FHR: Fetal heart tracing reviewed. Baseline: 145 bpm, Variability: Good {> 6 bpm), Accelerations: Reactive and Decelerations: Absent Category I  Analgesia: Labor support without medications, plans epidural   Labs: Lab Results  Component Value Date   WBC 11.1 (H) 07/25/2019   HGB 11.0 (L) 07/25/2019   HCT 34.3 (L) 07/25/2019   MCV 79.2 (L) 07/25/2019   PLT 376 07/25/2019    ASSESSMENT: 1) Labor curve reviewed.       Progress: Early latent labor.     Membranes: intact           Active Problems:   Labor and delivery, indication for care   PLAN: continue present management  Philip Aspen, CNM  07/25/2019 10:14 PM

## 2019-07-25 NOTE — Progress Notes (Signed)
BPP 6/8 for AFI, Dr. Amalia Hailey consulted. PT to L&D for induction.   Philip Aspen, CNM   Patient Name: Shannon [name] DOB: 12-21-1991 MRN: [id number] ULTRASOUND REPORT  Location: [facility] Date of Service: 07/25/2019   Indications:growth/afi Findings:  Shannon [name] intrauterine pregnancy is visualized with FHR at 177 BPM. Biometrics give an (U/S) Gestational age of [redacted]w[redacted]d and an (U/S) EDD of 08/02/2019; this correlates with the clinically established Estimated Date of Delivery: 07/27/19.  Fetal presentation is Cephalic.  Placenta: anterior. Grade: 2  AFI: 4 cm GLP 1.6 cm.  Growth percentile is 62. EFW: 3636 g ( 8 lbs 0 oz)   BPP Scoring: Movement: 2/2  Tone: 2/2  Breathing: 2/2  AFI: 0/2   Impression: 1. [redacted]w[redacted]d Viable Singleton Intrauterine pregnancy previously established criteria. 2. Growth is 62 %ile.  AFI is 4 cm. GLP 1.6 cm.  Oligohydramnios 3. BPP is 6/8  Recommendations: 1.Clinical correlation with the patient's History and Physical Exam.   Jenine  M. Albertine Grates     RDMS

## 2019-07-25 NOTE — Patient Instructions (Signed)

## 2019-07-26 ENCOUNTER — Encounter: Payer: Self-pay | Admitting: Anesthesiology

## 2019-07-26 DIAGNOSIS — O99824 Streptococcus B carrier state complicating childbirth: Secondary | ICD-10-CM | POA: Diagnosis not present

## 2019-07-26 DIAGNOSIS — O4103X0 Oligohydramnios, third trimester, not applicable or unspecified: Secondary | ICD-10-CM | POA: Diagnosis not present

## 2019-07-26 DIAGNOSIS — Z3A39 39 weeks gestation of pregnancy: Secondary | ICD-10-CM | POA: Diagnosis not present

## 2019-07-26 MED ORDER — LIDOCAINE-EPINEPHRINE (PF) 1.5 %-1:200000 IJ SOLN
INTRAMUSCULAR | Status: DC | PRN
  Administered 2019-07-26: 3 mL via EPIDURAL

## 2019-07-26 MED ORDER — METHYLERGONOVINE MALEATE 0.2 MG/ML IJ SOLN: 0.2000 mg | INTRAMUSCULAR | Status: DC | PRN

## 2019-07-26 MED ORDER — WITCH HAZEL-GLYCERIN EX PADS: 1.0000 "application " | MEDICATED_PAD | CUTANEOUS | Status: DC | PRN

## 2019-07-26 MED ORDER — PRENATAL MULTIVITAMIN CH
ORAL_TABLET | ORAL | Status: AC
  Administered 2019-07-26: 1 via ORAL
  Filled 2019-07-26: qty 1

## 2019-07-26 MED ORDER — COCONUT OIL OIL
1.0000 "application " | TOPICAL_OIL | Status: DC | PRN
  Filled 2019-07-26: qty 120

## 2019-07-26 MED ORDER — DIBUCAINE (PERIANAL) 1 % EX OINT: 1.0000 "application " | TOPICAL_OINTMENT | CUTANEOUS | Status: DC | PRN

## 2019-07-26 MED ORDER — METHYLERGONOVINE MALEATE 0.2 MG PO TABS
0.2000 mg | ORAL_TABLET | ORAL | Status: DC | PRN
  Filled 2019-07-26: qty 1

## 2019-07-26 MED ORDER — BENZOCAINE-MENTHOL 20-0.5 % EX AERO
INHALATION_SPRAY | CUTANEOUS | Status: AC
  Administered 2019-07-26: 1 via TOPICAL
  Filled 2019-07-26: qty 56

## 2019-07-26 MED ORDER — OXYCODONE-ACETAMINOPHEN 5-325 MG PO TABS: 2.0000 | ORAL_TABLET | ORAL | Status: DC | PRN

## 2019-07-26 MED ORDER — LIDOCAINE HCL (PF) 1 % IJ SOLN
INTRAMUSCULAR | Status: DC | PRN
  Administered 2019-07-26: 1 mL via INTRADERMAL

## 2019-07-26 MED ORDER — SODIUM CHLORIDE 0.9 % IV SOLN
INTRAVENOUS | Status: DC | PRN
  Administered 2019-07-26 (×2): 5 mL via EPIDURAL

## 2019-07-26 MED ORDER — FERROUS SULFATE 325 (65 FE) MG PO TABS
325.0000 mg | ORAL_TABLET | Freq: Every day | ORAL | Status: DC
  Administered 2019-07-26 – 2019-07-27 (×2): 325 mg via ORAL
  Filled 2019-07-26 (×3): qty 1

## 2019-07-26 MED ORDER — ONDANSETRON HCL 4 MG/2ML IJ SOLN: 4.0000 mg | INTRAMUSCULAR | Status: DC | PRN

## 2019-07-26 MED ORDER — IBUPROFEN 600 MG PO TABS
600.0000 mg | ORAL_TABLET | Freq: Four times a day (QID) | ORAL | Status: DC
  Administered 2019-07-26 – 2019-07-28 (×9): 600 mg via ORAL
  Filled 2019-07-26 (×8): qty 1

## 2019-07-26 MED ORDER — IBUPROFEN 600 MG PO TABS
ORAL_TABLET | ORAL | Status: AC
  Administered 2019-07-26: 08:00:00 600 mg via ORAL
  Filled 2019-07-26: qty 1

## 2019-07-26 MED ORDER — DOCUSATE SODIUM 100 MG PO CAPS
ORAL_CAPSULE | ORAL | Status: AC
  Administered 2019-07-26: 10:00:00 100 mg via ORAL
  Filled 2019-07-26: qty 1

## 2019-07-26 MED ORDER — BENZOCAINE-MENTHOL 20-0.5 % EX AERO
1.0000 "application " | INHALATION_SPRAY | CUTANEOUS | Status: DC | PRN
  Administered 2019-07-26: 1 via TOPICAL

## 2019-07-26 MED ORDER — SIMETHICONE 80 MG PO CHEW: 80.0000 mg | CHEWABLE_TABLET | ORAL | Status: DC | PRN

## 2019-07-26 MED ORDER — OXYCODONE-ACETAMINOPHEN 5-325 MG PO TABS: 1.0000 | ORAL_TABLET | ORAL | Status: DC | PRN

## 2019-07-26 MED ORDER — ACETAMINOPHEN 325 MG PO TABS
650.0000 mg | ORAL_TABLET | ORAL | Status: DC | PRN
  Administered 2019-07-26 – 2019-07-28 (×3): 650 mg via ORAL
  Filled 2019-07-26 (×3): qty 2

## 2019-07-26 MED ORDER — PRENATAL MULTIVITAMIN CH
1.0000 | ORAL_TABLET | Freq: Every day | ORAL | Status: DC
  Administered 2019-07-26 – 2019-07-28 (×3): 1 via ORAL
  Filled 2019-07-26 (×2): qty 1

## 2019-07-26 MED ORDER — DOCUSATE SODIUM 100 MG PO CAPS
100.0000 mg | ORAL_CAPSULE | Freq: Two times a day (BID) | ORAL | Status: DC
  Administered 2019-07-26 – 2019-07-27 (×3): 100 mg via ORAL
  Filled 2019-07-26 (×4): qty 1

## 2019-07-26 MED ORDER — TETANUS-DIPHTH-ACELL PERTUSSIS 5-2.5-18.5 LF-MCG/0.5 IM SUSP
0.5000 mL | Freq: Once | INTRAMUSCULAR | Status: DC
  Filled 2019-07-26: qty 0.5

## 2019-07-26 MED ORDER — FERROUS SULFATE 325 (65 FE) MG PO TABS
ORAL_TABLET | ORAL | Status: AC
  Administered 2019-07-26: 10:00:00 325 mg via ORAL
  Filled 2019-07-26: qty 1

## 2019-07-26 MED ORDER — ONDANSETRON HCL 4 MG PO TABS: 4.0000 mg | ORAL_TABLET | ORAL | Status: DC | PRN

## 2019-07-26 MED FILL — Oxytocin Inj 10 Unit/ML: INTRAMUSCULAR | Qty: 4 | Status: AC

## 2019-07-26 MED FILL — Sodium Chloride IV Soln 0.9%: INTRAVENOUS | Qty: 1000 | Status: AC

## 2019-07-26 NOTE — Anesthesia Preprocedure Evaluation (Signed)
Anesthesia Evaluation  Patient identified by MRN, date of birth, ID band Patient awake    Reviewed: Allergy & Precautions, H&P , NPO status , Patient's Chart, lab work & pertinent test results  History of Anesthesia Complications Negative for: history of anesthetic complications  Airway Mallampati: III  TM Distance: <3 FB Neck ROM: full    Dental  (+) Chipped   Pulmonary neg pulmonary ROS,           Cardiovascular Exercise Tolerance: Good (-) hypertensionnegative cardio ROS       Neuro/Psych  Headaches,    GI/Hepatic negative GI ROS, neg GERD  ,  Endo/Other    Renal/GU   negative genitourinary   Musculoskeletal   Abdominal   Peds  Hematology negative hematology ROS (+)   Anesthesia Other Findings Patient first tested positive for COVID on 7/2  Past Medical History: No date: Anxiety No date: Depression No date: Migraines 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  Past Surgical History: No date: CHOLECYSTECTOMY No date: FOOT SURGERY; Left No date: KNEE SURGERY; Left  BMI    Body Mass Index: 39.33 kg/m      Reproductive/Obstetrics (+) Pregnancy                             Anesthesia Physical Anesthesia Plan  ASA: III  Anesthesia Plan: Epidural   Post-op Pain Management:    Induction:   PONV Risk Score and Plan:   Airway Management Planned:   Additional Equipment:   Intra-op Plan:   Post-operative Plan:   Informed Consent: I have reviewed the patients History and Physical, chart, labs and discussed the procedure including the risks, benefits and alternatives for the proposed anesthesia with the patient or authorized representative who has indicated his/her understanding and acceptance.       Plan Discussed with: Anesthesiologist  Anesthesia Plan Comments:         Anesthesia Quick Evaluation

## 2019-07-26 NOTE — Anesthesia Procedure Notes (Signed)
Epidural Patient location during procedure: OB Start time: 07/26/2019 12:12 AM End time: 07/26/2019 12:16 AM  Staffing Anesthesiologist: , Precious Haws, MD Performed: anesthesiologist   Preanesthetic Checklist Completed: patient identified, site marked, surgical consent, pre-op evaluation, timeout performed, IV checked, risks and benefits discussed and monitors and equipment checked  Epidural Patient position: sitting Prep: ChloraPrep Patient monitoring: heart rate, continuous pulse ox and blood pressure Approach: midline Location: L3-L4 Injection technique: LOR saline  Needle:  Needle type: Tuohy  Needle gauge: 17 G Needle length: 9 cm and 9 Needle insertion depth: 7 cm Catheter type: closed end flexible Catheter size: 19 Gauge Catheter at skin depth: 12 cm Test dose: negative and 1.5% lidocaine with Epi 1:200 K  Assessment Sensory level: T10 Events: blood not aspirated, injection not painful, no injection resistance, negative IV test and no paresthesia  Additional Notes 1 attempt, I wore full COVID PPE during all of my interactions with this patient Pt. Evaluated and documentation done after procedure finished. Patient identified. Risks/Benefits/Options discussed with patient including but not limited to bleeding, infection, nerve damage, paralysis, failed block, incomplete pain control, headache, blood pressure changes, nausea, vomiting, reactions to medication both or allergic, itching and postpartum back pain. Confirmed with bedside nurse the patient's most recent platelet count. Confirmed with patient that they are not currently taking any anticoagulation, have any bleeding history or any family history of bleeding disorders. Patient expressed understanding and wished to proceed. All questions were answered. Sterile technique was used throughout the entire procedure. Please see nursing notes for vital signs. Test dose was given through epidural catheter and negative  prior to continuing to dose epidural or start infusion. Warning signs of high block given to the patient including shortness of breath, tingling/numbness in hands, complete motor block, or any concerning symptoms with instructions to call for help. Patient was given instructions on fall risk and not to get out of bed. All questions and concerns addressed with instructions to call with any issues or inadequate analgesia.   Patient tolerated the insertion well without immediate complications.Reason for block:procedure for pain

## 2019-07-27 LAB — CBC
HCT: 31.5 % — ABNORMAL LOW (ref 36.0–46.0)
Hemoglobin: 9.9 g/dL — ABNORMAL LOW (ref 12.0–15.0)
MCH: 25.2 pg — ABNORMAL LOW (ref 26.0–34.0)
MCHC: 31.4 g/dL (ref 30.0–36.0)
MCV: 80.2 fL (ref 80.0–100.0)
Platelets: 344 10*3/uL (ref 150–400)
RBC: 3.93 MIL/uL (ref 3.87–5.11)
RDW: 16 % — ABNORMAL HIGH (ref 11.5–15.5)
WBC: 10.4 10*3/uL (ref 4.0–10.5)
nRBC: 0 % (ref 0.0–0.2)

## 2019-07-27 LAB — RPR: RPR Ser Ql: NONREACTIVE

## 2019-07-27 MED ORDER — SERTRALINE HCL 25 MG PO TABS
50.0000 mg | ORAL_TABLET | Freq: Every day | ORAL | Status: DC
  Administered 2019-07-28: 50 mg via ORAL
  Filled 2019-07-27: qty 2

## 2019-07-27 MED ORDER — SERTRALINE HCL 50 MG PO TABS: 50.0000 mg | ORAL_TABLET | Freq: Every day | ORAL | 6 refills | Status: DC

## 2019-07-27 MED ORDER — VITAMIN D3 125 MCG (5000 UT) PO CAPS: 1.0000 | ORAL_CAPSULE | Freq: Every day | ORAL | 2 refills | Status: DC

## 2019-07-27 MED ORDER — NORETHINDRONE 0.35 MG PO TABS: 1.0000 | ORAL_TABLET | Freq: Every day | ORAL | 11 refills | Status: DC

## 2019-07-27 NOTE — Clinical Social Work Maternal (Signed)
CLINICAL SOCIAL WORK MATERNAL/CHILD NOTE  Patient Details  Name: Shannon [name] MRN: [id number] Date of Birth: 1991/05/29  Date:  07/27/2019  Clinical Social Worker Initiating Note:  Baker Hughes IncorporatedBailey Calianne Larue, LCSW Date/Time: Initiated:  07/27/19/1618     Child's Name:  Marlene BastMason   Biological Parents:  Mother, Father   Need for Interpreter:  None   Reason for Referral:  Other (Comment)(Depression)   Address:  [address] [place] [id number]    Phone number:  [phone] (home)     Additional phone number:   Household Members/Support Persons (HM/SP):   Household Member/Support Person 1   HM/SP Name Relationship DOB or Age  HM/SP -1 Engineer, materialsAaron Daw Husband    HM/SP -2        HM/SP -3        HM/SP -4        HM/SP -5        HM/SP -6        HM/SP -7        HM/SP -8          Natural Supports (not living in the home):  Parent, Extended Family   Professional Supports: Therapist   Employment: Full-time   Type of Work: [employer]   Education:  Engineer, maintenance (IT)College graduate   Homebound arranged:    Surveyor, quantityinancial Resources:  Media plannerrivate Insurance   Other Resources:      Cultural/Religious Considerations Which May Impact Care:    Strengths:  Ability to meet basic needs    Psychotropic Medications:         Pediatrician:       [place]    [place]    [place]     [place]    [place]      Pediatrician Fax Number:    Risk Factors/Current Problems:  None   Cognitive State:  Alert    Mood/Affect:  Comfortable , Calm    CSW Assessment: Clinical Child psychotherapistocial Worker (CSW) received consult that mother scored 11 on depression screen. Mother is positive for covid so CSW contacted her via telephone to complete assessment. Per mother she has tested positive for covid several times and was told it takes 60 days to get out of her system. Mother reported that she feels like the healthcare team is avoiding her. CSW provided  emotional support. Mother reported that [place] husband Clifton Custardaron and they have a 3 y.o daughter. Per mother her husband tested negative for covid and her 28 y.o was positive. Per mother her 593 y.o did not have any symptoms. Mother reported that she has been stressed out and having anxiety because of covid and her husband starting a new job. Per mother her husband is out of town during the week and comes home on the weekends. Per mother she does have support from her husband's mother that lives close by. Mother reported that her mother provides support however she is not close by. Mother reported that she had postpartum depression with her first baby. Mother reported that she is not having thoughts of hurting herself or anybody else. Mother reported that she works for [employer] and goes to a therapist through her employee assistance program. Mother reported that she will reach out to her provider or therapist if her depression and anxiety symptoms persist or get worse. CSW provided emotional support. Mother reported that she has all the supplies needed for infant and was  able to have a small baby shower. Mother reported no needs or concerns. CSW provided RN a list of [place] resources including [facility] to give to mother. Please reconsult if future social work needs arise. CSW signing off.    SW Plan/Description:  No Further Intervention Required/No Barriers to Discharge    Lyriq Jarchow, Lenice Llamas 07/27/2019, 4:22 PM

## 2019-07-27 NOTE — Progress Notes (Signed)
Post Partum Day 1 Subjective: no complaints and up ad lib  Objective: Blood pressure 107/80, pulse 70, temperature 97.9 F (36.6 C), temperature source Oral, resp. rate 18, height 5\' 3"  (1.6 m), weight 100.7 kg, last menstrual period 10/20/2018, SpO2 99 %, unknown if currently breastfeeding.  Physical Exam:  General: alert, cooperative and appears stated age Lochia: appropriate Uterine Fundus: firm DVT Evaluation: No evidence of DVT seen on physical exam. Negative Homan's sign.  Recent Labs    07/25/19 1810 07/27/19 0734  HGB 11.0* 9.9*  HCT 34.3* 31.5*    Assessment/Plan: Discharge home, Breastfeeding, Social Work consult, Circumcision prior to discharge and Contraception progestin-only pills for now Infant feeding Breast & Bottle; has been pumping to increase milk supply coming in.  Infant presently under bili-lights and recent levels have come down. Desires discharge as soon as possible.  Discussed depression and anxiety and will restart SSRI use.     LOS: 2 days   Melody N Shambley 07/27/2019, 4:59 PM

## 2019-07-27 NOTE — Anesthesia Postprocedure Evaluation (Signed)
Anesthesia Post Note  Patient: Shannon [name]  Procedure(s) Performed: AN AD HOC LABOR EPIDURAL  Patient location during evaluation: Mother Baby Anesthesia Type: Epidural Level of consciousness: awake, oriented, awake and alert and patient cooperative Pain management: pain level controlled Vital Signs Assessment: post-procedure vital signs reviewed and stable Respiratory status: spontaneous breathing, nonlabored ventilation and respiratory function stable Cardiovascular status: stable Postop Assessment: no headache, no backache, patient able to bend at knees, adequate PO intake and no apparent nausea or vomiting Anesthetic complications: no Comments: Pt positive COVID     Last Vitals:  Vitals:   07/26/19 1730 07/26/19 2033  BP: 112/79 97/79  Pulse: 71 80  Resp: 18   Temp: 37 C 36.9 C  SpO2: 99% 100%    Last Pain:  Vitals:   07/26/19 2033  TempSrc: Oral  PainSc:                  Ricki Miller

## 2019-07-28 NOTE — Progress Notes (Signed)
Reviewed D/C instructions with pt. Pt verbalized understanding of teaching. Discharged to home with husband and daughter via W/C. Pt to schedule f/u appt. Per CNM, advise pt to schedule tele-visit in 2-4 weeks for PPD follow up if feeling ok, face to face visit sooner if not ok. Message given to patient and printed on D/C instructions.

## 2019-08-01 NOTE — Discharge Summary (Signed)
Please refer to discharge summary from 07/26/2019 as nothing has changed since that note was dictated.  Patient remained for 12 more hours due to infant need for phototherapy.   Masahiro Iglesia,CNM

## 2019-08-02 ENCOUNTER — Telehealth: Payer: Self-pay | Admitting: Certified Nurse Midwife

## 2019-08-02 NOTE — Telephone Encounter (Signed)
[employer] case manager called to confirm pt's birth and information. She also stated that she was unable to reach pt several times so she will be closing her case. Thank you.

## 2019-08-22 ENCOUNTER — Other Ambulatory Visit: Payer: Self-pay | Admitting: *Deleted

## 2019-08-22 MED ORDER — SERTRALINE HCL 50 MG PO TABS: 50.0000 mg | ORAL_TABLET | Freq: Every day | ORAL | 6 refills | Status: DC

## 2019-09-07 ENCOUNTER — Other Ambulatory Visit: Payer: Self-pay

## 2019-09-07 ENCOUNTER — Encounter: Payer: Self-pay | Admitting: Certified Nurse Midwife

## 2019-09-07 ENCOUNTER — Ambulatory Visit (INDEPENDENT_AMBULATORY_CARE_PROVIDER_SITE_OTHER): Payer: Managed Care, Other (non HMO) | Admitting: Certified Nurse Midwife

## 2019-09-07 MED ORDER — NORETHIN ACE-ETH ESTRAD-FE 1-20 MG-MCG PO TABS: 1.0000 | ORAL_TABLET | Freq: Every day | ORAL | 11 refills | Status: DC

## 2019-09-07 MED ORDER — SERTRALINE HCL 100 MG PO TABS: 100.0000 mg | ORAL_TABLET | Freq: Every day | ORAL | 1 refills | Status: DC

## 2019-09-07 NOTE — Patient Instructions (Signed)
Preventive Care 21-28 Years Old, Female Preventive care refers to visits with your health care provider and lifestyle choices that can promote health and wellness. This includes:  A yearly physical exam. This may also be called an annual well check.  Regular dental visits and eye exams.  Immunizations.  Screening for certain conditions.  Healthy lifestyle choices, such as eating a healthy diet, getting regular exercise, not using drugs or products that contain nicotine and tobacco, and limiting alcohol use. What can I expect for my preventive care visit? Physical exam Your health care provider will check your:  Height and weight. This may be used to calculate body mass index (BMI), which tells if you are at a healthy weight.  Heart rate and blood pressure.  Skin for abnormal spots. Counseling Your health care provider may ask you questions about your:  Alcohol, tobacco, and drug use.  Emotional well-being.  Home and relationship well-being.  Sexual activity.  Eating habits.  Work and work environment.  Method of birth control.  Menstrual cycle.  Pregnancy history. What immunizations do I need?  Influenza (flu) vaccine  This is recommended every year. Tetanus, diphtheria, and pertussis (Tdap) vaccine  You may need a Td booster every 10 years. Varicella (chickenpox) vaccine  You may need this if you have not been vaccinated. Human papillomavirus (HPV) vaccine  If recommended by your health care provider, you may need three doses over 6 months. Measles, mumps, and rubella (MMR) vaccine  You may need at least one dose of MMR. You may also need a second dose. Meningococcal conjugate (MenACWY) vaccine  One dose is recommended if you are age 19-21 years and a first-year college student living in a residence hall, or if you have one of several medical conditions. You may also need additional booster doses. Pneumococcal conjugate (PCV13) vaccine  You may need  this if you have certain conditions and were not previously vaccinated. Pneumococcal polysaccharide (PPSV23) vaccine  You may need one or two doses if you smoke cigarettes or if you have certain conditions. Hepatitis A vaccine  You may need this if you have certain conditions or if you travel or work in places where you may be exposed to hepatitis A. Hepatitis B vaccine  You may need this if you have certain conditions or if you travel or work in places where you may be exposed to hepatitis B. Haemophilus influenzae type b (Hib) vaccine  You may need this if you have certain conditions. You may receive vaccines as individual doses or as more than one vaccine together in one shot (combination vaccines). Talk with your health care provider about the risks and benefits of combination vaccines. What tests do I need?  Blood tests  Lipid and cholesterol levels. These may be checked every 5 years starting at age 20.  Hepatitis C test.  Hepatitis B test. Screening  Diabetes screening. This is done by checking your blood sugar (glucose) after you have not eaten for a while (fasting).  Sexually transmitted disease (STD) testing.  BRCA-related cancer screening. This may be done if you have a family history of breast, ovarian, tubal, or peritoneal cancers.  Pelvic exam and Pap test. This may be done every 3 years starting at age 21. Starting at age 30, this may be done every 5 years if you have a Pap test in combination with an HPV test. Talk with your health care provider about your test results, treatment options, and if necessary, the need for more tests.   Follow these instructions at home: Eating and drinking   Eat a diet that includes fresh fruits and vegetables, whole grains, lean protein, and low-fat dairy.  Take vitamin and mineral supplements as recommended by your health care provider.  Do not drink alcohol if: ? Your health care provider tells you not to drink. ? You are  pregnant, may be pregnant, or are planning to become pregnant.  If you drink alcohol: ? Limit how much you have to 0-1 drink a day. ? Be aware of how much alcohol is in your drink. In the [place] one drink equals one 12 oz bottle of beer (355 mL), one 5 oz glass of wine (148 mL), or one 1 oz glass of hard liquor (44 mL). Lifestyle  Take daily care of your teeth and gums.  Stay active. Exercise for at least 30 minutes on 5 or more days each week.  Do not use any products that contain nicotine or tobacco, such as cigarettes, e-cigarettes, and chewing tobacco. If you need help quitting, ask your health care provider.  If you are sexually active, practice safe sex. Use a condom or other form of birth control (contraception) in order to prevent pregnancy and STIs (sexually transmitted infections). If you plan to become pregnant, see your health care provider for a preconception visit. What's next?  Visit your health care provider once a year for a well check visit.  Ask your health care provider how often you should have your eyes and teeth checked.  Stay up to date on all vaccines. This information is not intended to replace advice given to you by your health care provider. Make sure you discuss any questions you have with your health care provider. Document Released: 02/08/2002 Document Revised: 08/24/2018 Document Reviewed: 08/24/2018 Elsevier Patient Education  2020 [employer].

## 2019-09-07 NOTE — Progress Notes (Addendum)
Subjective:    Shannon [name] is a 28 y.o. G25P2012 Caucasian female who presents for a postpartum visit. She is 6 weeks postpartum following a spontaneous vaginal delivery at 39.6 gestational weeks. Anesthesia: epidural. I have fully reviewed the prenatal and intrapartum course. Postpartum course has been WNL Baby's course has been WNL. Baby is feeding by bottle. Bleeding no bleeding. Bowel function is normal. Bladder function is normal. Patient is not sexually active. Last sexual activity: prior to delivery. Contraception method is oral progesterone-only contraceptive. Postpartum depression screening: positive Score phq9 18, GAD 21.  Last pap 07/27/17 and was neg.  The following portions of the patient's history were reviewed and updated as appropriate: allergies, current medications, past medical history, past surgical history and problem list.  Review of Systems Pertinent items are noted in HPI.   Vitals:   09/07/19 1553  BP: 118/83  Pulse: 69  Weight: 195 lb 6 oz (88.6 kg)  Height: 5\' 3"  (1.6 m)   No LMP recorded.  Objective:   General:  alert, cooperative and no distress   Breasts:  deferred, no complaints  Lungs: clear to auscultation bilaterally  Heart:  regular rate and rhythm  Abdomen: soft, nontender   Vulva: normal  Vagina: normal vagina  Cervix:  closed  Corpus: Well-involuted  Adnexa:  Non-palpable  Rectal Exam: no hemorrhoids        Assessment:   Postpartum exam 6 wks s/p SVD Bottle feeding Depression screening Contraception counseling   Plan:  : OCP (estrogen/progesterone), changed pill to OCP ( no longer breast feeding) Zoloft dose increased 100 mg daily. Pt is going to reach out to work for counseling services, she had done that in the past and it helped. Follow up in 4 wks for medication check.   Philip Aspen, CNM

## 2019-09-30 ENCOUNTER — Other Ambulatory Visit: Payer: Self-pay | Admitting: Certified Nurse Midwife

## 2019-10-05 ENCOUNTER — Encounter: Payer: Managed Care, Other (non HMO) | Admitting: Certified Nurse Midwife

## 2019-10-09 ENCOUNTER — Other Ambulatory Visit: Payer: Self-pay

## 2019-10-09 ENCOUNTER — Ambulatory Visit (INDEPENDENT_AMBULATORY_CARE_PROVIDER_SITE_OTHER): Payer: Managed Care, Other (non HMO) | Admitting: Certified Nurse Midwife

## 2019-10-09 ENCOUNTER — Encounter: Payer: Self-pay | Admitting: Certified Nurse Midwife

## 2019-10-09 VITALS — BP 103/78 | HR 67 | Ht 63.0 in | Wt 197.3 lb

## 2019-10-09 DIAGNOSIS — Z79899 Other long term (current) drug therapy: Secondary | ICD-10-CM

## 2019-10-09 NOTE — Progress Notes (Signed)
  Medication Management Clinic Visit Note  Patient: Shannon [name] MRN: [id number] Date of Birth: 08-Mar-1991 PCP: Patient, No Pcp Per   Shannon [name] 28 y.o. female presents for follow up visit today due to medication dose change on 09/07/2019.   BP 103/78   Pulse 67   Ht 5\' 3"  (1.6 m)   Wt 197 lb 5 oz (89.5 kg)   LMP 09/15/2019 (Exact Date)   Breastfeeding No   BMI 34.95 kg/m   Patient Information   Past Medical History:  Diagnosis Date  . Anxiety   . Depression   . Migraines   . Post partum depression       Past Surgical History:  Procedure Laterality Date  . CHOLECYSTECTOMY    . FOOT SURGERY Left   . KNEE SURGERY Left      Family History  Problem Relation Age of Onset  . Migraines Mother   . Cancer Maternal Grandmother   . Cancer Maternal Grandfather   . Hypertension Paternal Grandfather   . Diabetes Maternal Aunt   . Diabetes Maternal Uncle   . Diabetes Maternal Aunt     New Diagnoses (since last visit):   Family Support: Good   Social History   Substance and Sexual Activity  Alcohol Use No   Comment: occasional, while not pregnant      Social History   Tobacco Use  Smoking Status Never Smoker  Smokeless Tobacco Never Used      Health Maintenance  Topic Date Due  . PAP-Cervical Cytology Screening  07/27/2020  . PAP SMEAR-Modifier  07/27/2020  . TETANUS/TDAP  05/09/2029  . INFLUENZA VACCINE  Completed  . HIV Screening  Completed    GAD 7 : Generalized Anxiety Score 10/09/2019 09/07/2019  Nervous, Anxious, on Edge 3 3  Control/stop worrying 3 3  Worry too much - different things 3 3  Trouble relaxing 1 3  Restless 3 3  Easily annoyed or irritable 3 3  Afraid - awful might happen 1 3  Total GAD 7 Score 17 21  Anxiety Difficulty Very difficult Extremely difficult   PHQ9 SCORE ONLY 10/09/2019 09/07/2019 07/27/2017  Score 19 18 10       Assessment and Plan:  PT states that she has not been taking the 100 mg of zoloft because it  makes her too sleepy. She states that being sleeping contributes to her mood so she has gone back to taking 50 mg daily. Discussed decreasing zoloft to 75 mg daily vs changing to a different SSRI. Specifically discussed use of Lexapro as it has an incidence of 4-13 % of pt experiencing drowsiness vs zoloft 11%. Pt will 75 mg daily x 1 week at bed time. She will let me know if she is able to tolerate. If not will switch to lexapro.She has not yet followed up with her work for the counseling. She state she will do that before our next visit. Offered to place referral, she declines.  Follow up 4 wks for medication  Check.   I attest more than 50% of visit spent reviewing history, discussing current symptoms, discussing medication , and counseling. Face to face time 10 min.   Philip Aspen, CNM

## 2019-10-09 NOTE — Patient Instructions (Signed)

## 2019-10-10 ENCOUNTER — Other Ambulatory Visit: Payer: Self-pay | Admitting: Certified Nurse Midwife

## 2019-10-10 MED ORDER — SERTRALINE HCL 50 MG PO TABS: 75.0000 mg | ORAL_TABLET | Freq: Every day | ORAL | 2 refills | Status: DC

## 2019-11-06 ENCOUNTER — Other Ambulatory Visit: Payer: Self-pay | Admitting: Certified Nurse Midwife

## 2019-11-06 ENCOUNTER — Encounter: Payer: Self-pay | Admitting: Certified Nurse Midwife

## 2019-11-06 ENCOUNTER — Other Ambulatory Visit: Payer: Self-pay

## 2019-11-06 ENCOUNTER — Ambulatory Visit (INDEPENDENT_AMBULATORY_CARE_PROVIDER_SITE_OTHER): Payer: Managed Care, Other (non HMO) | Admitting: Certified Nurse Midwife

## 2019-11-06 VITALS — BP 93/67 | HR 73 | Ht 63.0 in | Wt 202.2 lb

## 2019-11-06 DIAGNOSIS — Z79899 Other long term (current) drug therapy: Secondary | ICD-10-CM

## 2019-11-06 DIAGNOSIS — O99345 Other mental disorders complicating the puerperium: Secondary | ICD-10-CM

## 2019-11-06 DIAGNOSIS — F53 Postpartum depression: Secondary | ICD-10-CM | POA: Diagnosis not present

## 2019-11-06 MED ORDER — NORETHIN ACE-ETH ESTRAD-FE 1-20 MG-MCG PO TABS: 1.0000 | ORAL_TABLET | Freq: Every day | ORAL | 11 refills | Status: DC

## 2019-11-06 MED ORDER — SERTRALINE HCL 50 MG PO TABS: 75.0000 mg | ORAL_TABLET | Freq: Every day | ORAL | 4 refills | Status: DC

## 2019-11-06 MED ORDER — NORGESTIMATE-ETH ESTRADIOL 0.25-35 MG-MCG PO TABS: 1.0000 | ORAL_TABLET | Freq: Every day | ORAL | 11 refills | Status: DC

## 2019-11-06 NOTE — Progress Notes (Signed)
Pt changed her mind and request not to change birth control pill. Order placed.   Philip Aspen, CNM

## 2019-11-06 NOTE — Progress Notes (Signed)
  Medication Management Clinic Visit Note  Patient: Shannon [name] MRN: [id number] Date of Birth: Feb 05, 1991 PCP: Patient, No Pcp Per   Ursula Alert Pancoast 28 y.o. female presents for a follow visit today.She was started on Zoloft for postpartum depression and was seen 4 wks ago. Her dose was changed to 75 mg. She states she is feeling better.   BP 93/67   Pulse 73   Ht 5\' 3"  (1.6 m)   Wt 202 lb 3 oz (91.7 kg)   BMI 35.82 kg/m   Patient Information   Past Medical History:  Diagnosis Date  . Anxiety   . Depression   . Migraines   . Post partum depression       Past Surgical History:  Procedure Laterality Date  . CHOLECYSTECTOMY    . FOOT SURGERY Left   . KNEE SURGERY Left      Family History  Problem Relation Age of Onset  . Migraines Mother   . Cancer Maternal Grandmother   . Cancer Maternal Grandfather   . Hypertension Paternal Grandfather   . Diabetes Maternal Aunt   . Diabetes Maternal Uncle   . Diabetes Maternal Aunt     New Diagnoses (since last visit):   Family Support: Good  Social History   Substance and Sexual Activity  Alcohol Use No   Comment: occasional, while not pregnant      Social History   Tobacco Use  Smoking Status Never Smoker  Smokeless Tobacco Never Used      Health Maintenance  Topic Date Due  . PAP-Cervical Cytology Screening  07/27/2020  . PAP SMEAR-Modifier  07/27/2020  . TETANUS/TDAP  05/09/2029  . INFLUENZA VACCINE  Completed  . HIV Screening  Completed   PHQ9 SCORE ONLY 11/06/2019 10/09/2019 09/07/2019  Score 6 19 18      Assessment and Plan: Phq9 score improved. She has has one counseling session through her work and states it was helpful. She is scheduled to continue counseling and has up to 12 visit available to her if needed. She will continue Zoloft 75 mg daily for a total of 6 months at which point we will re-evaluate use and discuss possibly coming off of medication. She verbalizes and agrees to plan of  care. She request to change BC pill due to some of the side effect from current pill. Pill changed from Loestrin to Storey.  Follow up as discussed or sooner if needed.   I attest more than 50% of visit spent reviewing history and discussing plan of care. Face to face time 10 min.   Philip Aspen, CNM

## 2019-11-06 NOTE — Patient Instructions (Signed)
Depression Screening Depression screening is a tool that your health care provider can use to learn if you have symptoms of depression. Depression is a common condition with many symptoms that are also often found in other conditions. Depression is treatable, but it must first be diagnosed. You may not know that certain feelings, thoughts, and behaviors that you are having can be symptoms of depression. Taking a depression screening test can help you and your health care provider decide if you need more assessment, or if you should be referred to a mental health care provider. What are the screening tests?  You may have a physical exam to see if another condition is affecting your mental health. You may have a blood or urine sample taken during the physical exam.  You may be interviewed using a screening tool that was developed from research, such as one of these: ? Patient Health Questionnaire (PHQ). This is a set of either 2 or 9 questions. A health care provider who has been trained to score this screening test uses a guide to assess if your symptoms suggest that you may have depression. ? Hamilton Depression Rating Scale (HAM-D). This is a set of either 17 or 24 questions. You may be asked to take it again during or after your treatment, to see if your depression has gotten better. ? Beck Depression Inventory (BDI). This is a set of 21 multiple choice questions. Your health care provider scores your answers to assess:  Your level of depression, ranging from mild to severe.  Your response to treatment.  Your health care provider may talk with you about your daily activities, such as eating, sleeping, work, and recreation, and ask if you have had any changes in activity.  Your health care provider may ask you to see a mental health specialist, such as a psychiatrist or psychologist, for more evaluation. Who should be screened for depression?   All adults, including adults with a family history  of a mental health disorder.  Adolescents who are 12-18 years old.  People who are recovering from a myocardial infarction (MI).  Pregnant women, or women who have given birth.  People who have a long-term (chronic) illness.  Anyone who has been diagnosed with another type of a mental health disorder.  Anyone who has symptoms that could show depression. What do my results mean? Your health care provider will review the results of your depression screening, physical exam, and lab tests. Positive screens suggest that you may have depression. Screening is the first step in getting the care that you may need. It is up to you to get your screening results. Ask your health care provider, or the department that is doing your screening tests, when your results will be ready. Talk with your health care provider about your results and diagnosis. A diagnosis of depression is made using the Diagnostic and Statistical Manual of Mental Disorders (DSM-V). This is a book that lists the number and type of symptoms that must be present for a health care provider to give a specific diagnosis.  Your health care provider may work with you to treat your symptoms of depression, or your health care provider may help you find a mental health provider who can assess, diagnose, and treat your depression. Get help right away if:  You have thoughts about hurting yourself or others. If you ever feel like you may hurt yourself or others, or have thoughts about taking your own life, get help right away. You   can go to your nearest emergency department or call:  Your local emergency services (911 in the [place]  A suicide crisis helpline, such as the National Suicide Prevention Lifeline at 1-800-273-8255. This is open 24 hours a day. Summary  Depression screening is the first step in getting the help that you may need.  If your screening test shows symptoms of depression (is positive), your health care provider may ask  you to see a mental health provider.  Anyone who is age 12 or older should be screened for depression. This information is not intended to replace advice given to you by your health care provider. Make sure you discuss any questions you have with your health care provider. Document Released: 04/29/2017 Document Revised: 11/25/2017 Document Reviewed: 04/29/2017 Elsevier Patient Education  2020 Elsevier Inc.  

## 2019-11-21 ENCOUNTER — Other Ambulatory Visit: Payer: Self-pay | Admitting: Certified Nurse Midwife

## 2019-11-21 MED ORDER — NORETHIN ACE-ETH ESTRAD-FE 1-20 MG-MCG PO TABS: 1.0000 | ORAL_TABLET | Freq: Every day | ORAL | 11 refills | Status: DC

## 2020-01-06 IMAGING — DX PORTABLE CHEST - 1 VIEW
1 series · 1 of 1 positions shown · non-contrast
Comparison: None.

CLINICAL DATA: Cough and fever.

EXAM:
PORTABLE CHEST 1 VIEW

[chest ap]
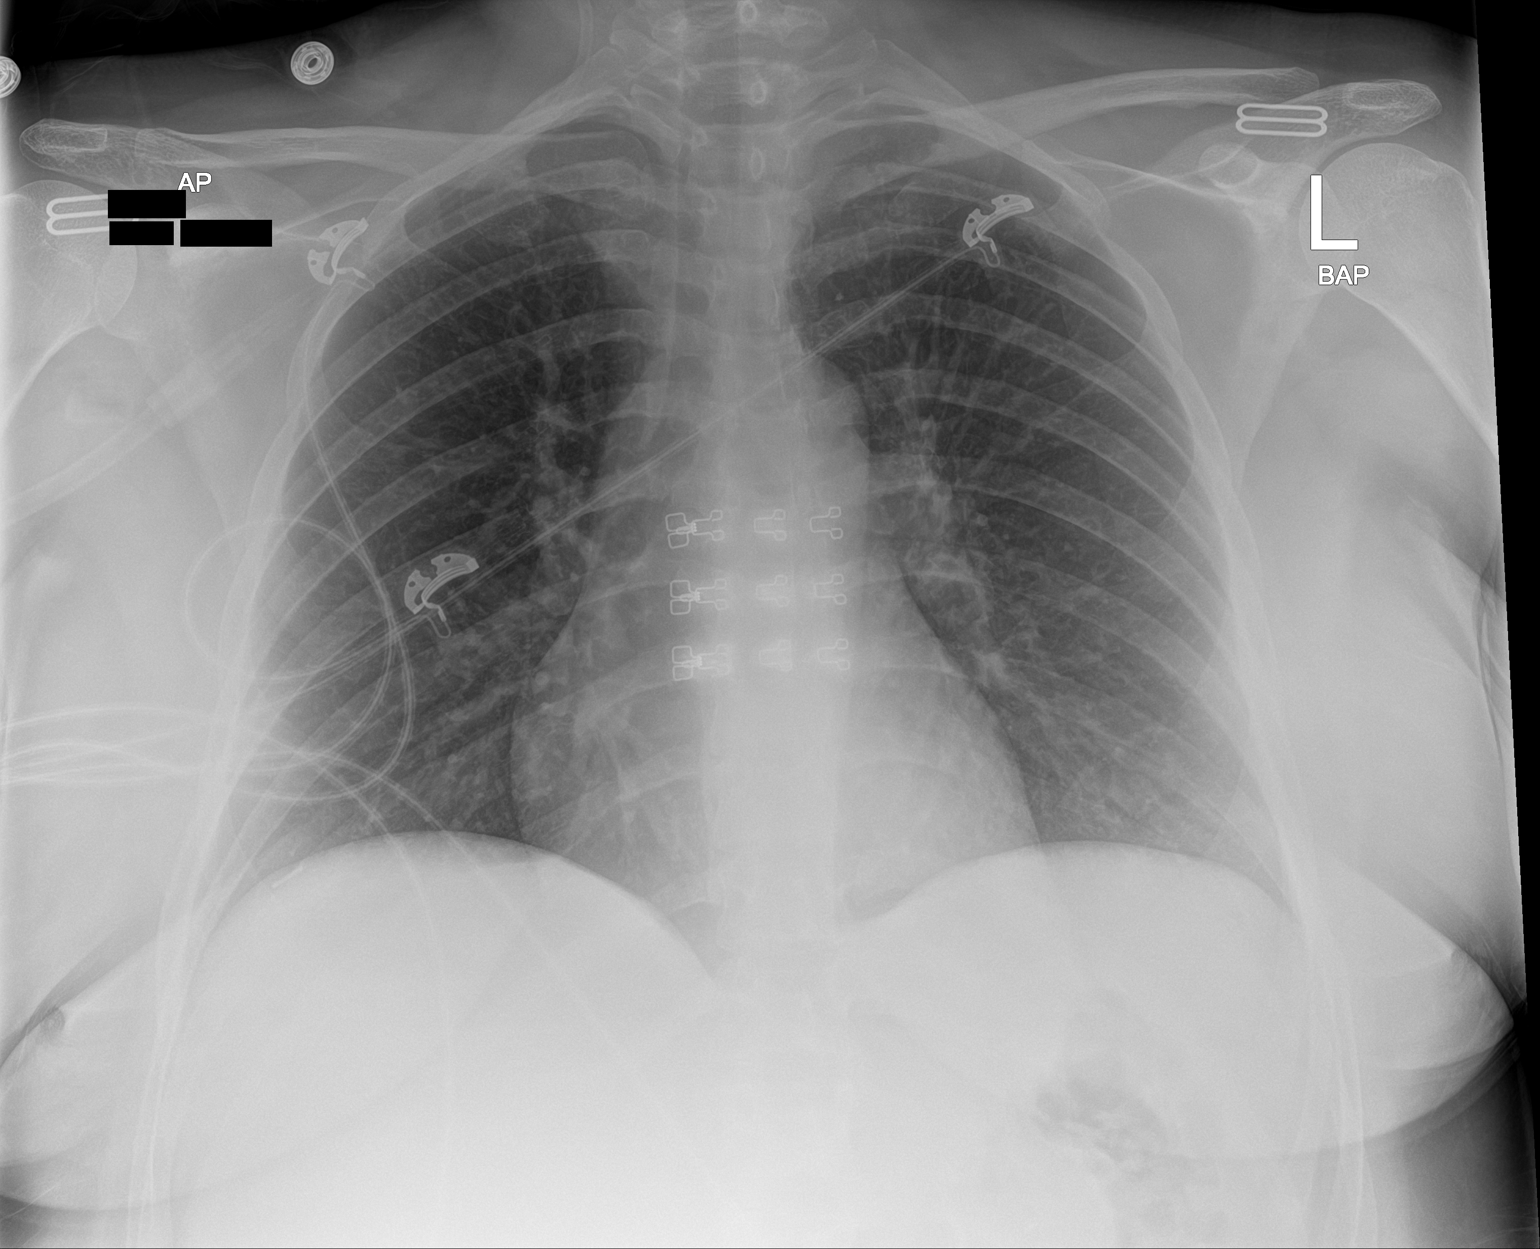

[1 of 1 positions shown; findings below may reference images not displayed]

FINDINGS: The heart size and mediastinal contours are within normal limits.
Both lungs are clear. The visualized skeletal structures are
unremarkable.
IMPRESSION: No active disease.

## 2020-02-09 IMAGING — US US MFM OB DETAIL+14 WK
1 of 2 series · 12 of 28 positions shown · non-contrast
Comparison: none

PATIENT INFO:

PERFORMED BY:
                   [facility]
SERVICE(S) PROVIDED:
 ----------------------------------------------------------------------
INDICATIONS:
  17 weeks gestation of pregnancy
FETAL EVALUATION:
 Num Of Fetuses:         1
 Fetal Heart Rate(bpm):  162
 Presentation:           Variable
 Placenta:               Anterior
 P. Cord Insertion:      Normal
BIOMETRY:
 BPD:      38.2  mm     G. Age:  17w 5d         40  %    CI:        69.39   %    70 - 86
                                                         FL/HC:      17.0   %    15.8 - 18
 HC:      146.4  mm     G. Age:  17w 6d         40  %
                                                         FL/BPD:     65.2   %
 FL:       24.9  mm     G. Age:  17w 4d         33  %
 HUM:      24.8  mm     G. Age:  17w 6d         52  %
GESTATIONAL AGE:
 LMP:           17w 6d        Date:  10/20/18                 EDD:   07/27/19
 U/S Today:     17w 5d                                        EDD:   07/28/19
 Best:          17w 6d     Det. By:  LMP  (10/20/18)          EDD:   07/27/19
ANATOMY:
 Cranium:               Normal appearance      Aortic Arch:            Normal appearance
 Cavum:                 CSP visualized         Ductal Arch:            Normal appearance
 Ventricles:            Normal appearance      Diaphragm:              Within Normal Limits
 Choroid Plexus:        Within Normal Limits   Stomach:                Seen
 Cerebellum:            Within Normal Limits   Abdomen:                Within Normal
                                                                       Limits
 Posterior Fossa:       Within Normal Limits   Abdominal Wall:         Normal appearance
 Nuchal Fold:           Within Normal Limits   Cord Vessels:           3 vessels
 Face:                  Orbits visualized      Kidneys:                Normal appearance
 Lips:                  Normal appearance      Bladder:                Seen
 Thoracic:              Within Normal Limits   Spine:                  Normal appearance
 Heart:                 4-Chamber view         Upper Extremities:      Visualized
                        appears normal
 RVOT:                  Normal appearance      Lower Extremities:      Visualized
 LVOT:                  Normal appearance
CERVIX UTERUS ADNEXA:
 Cervix
 Length:           4.47  cm.

[Series 1: us mfm ob detail+14 wk · 0.23mm/px · 12 of 72 slices shown]
[im 3/72]
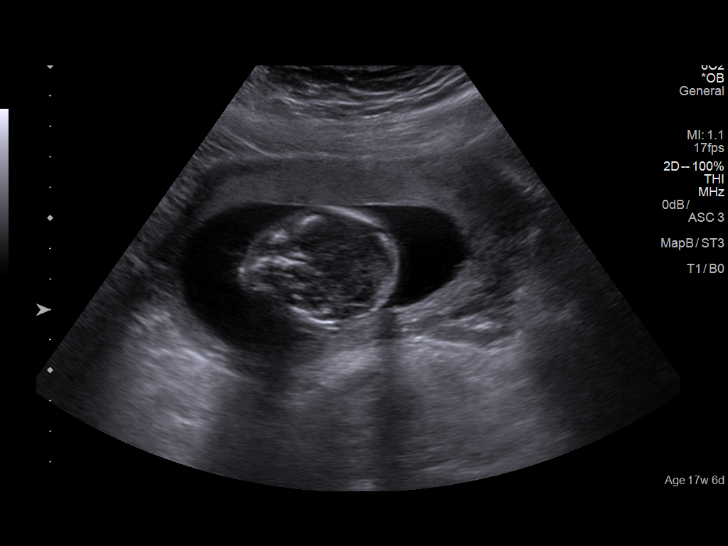
[im 9/72]
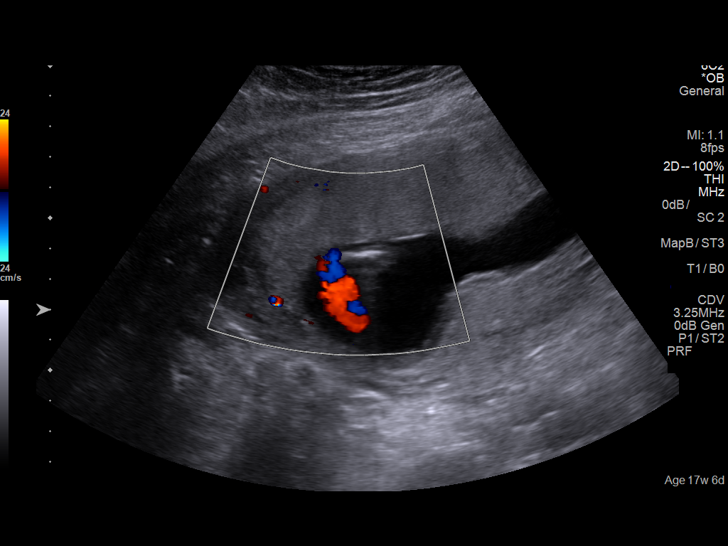
[im 14/72]
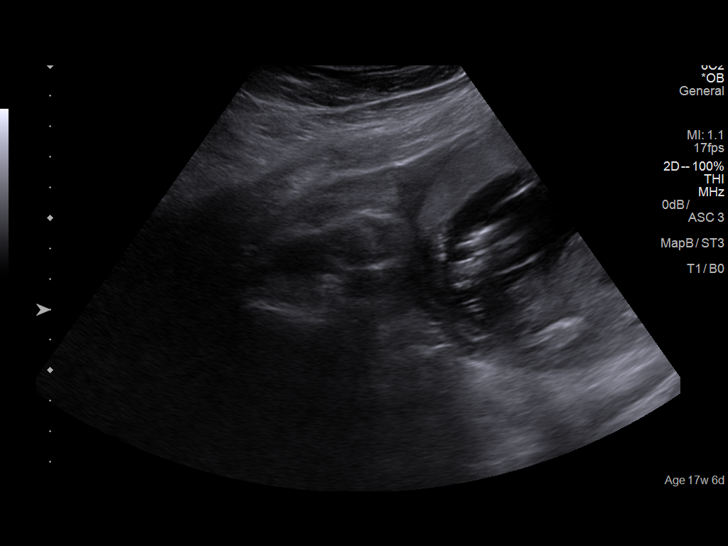
[im 22/72]
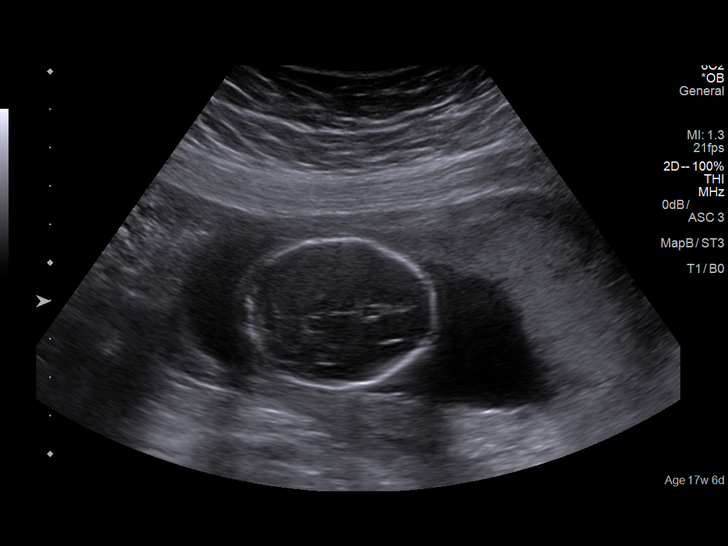
[im 28/72]
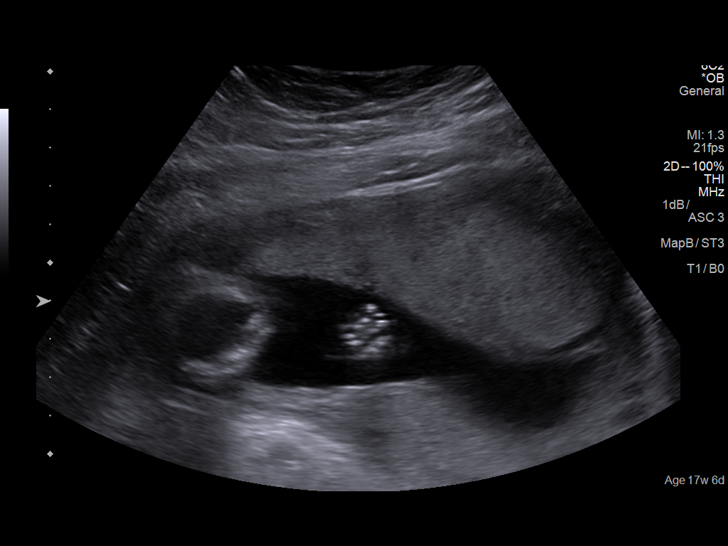
[im 33/72]
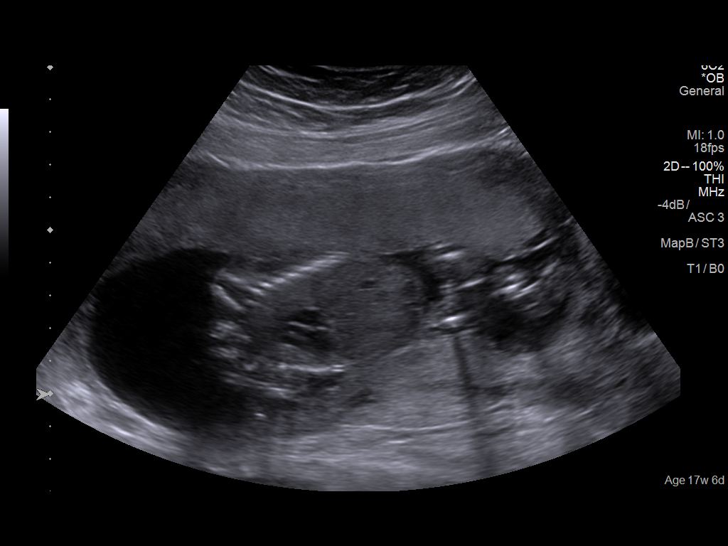
[im 41/72]
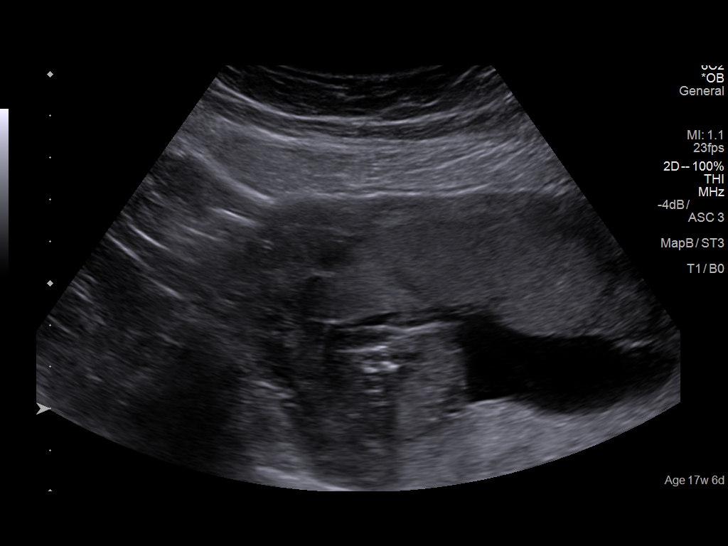
[im 47/72]
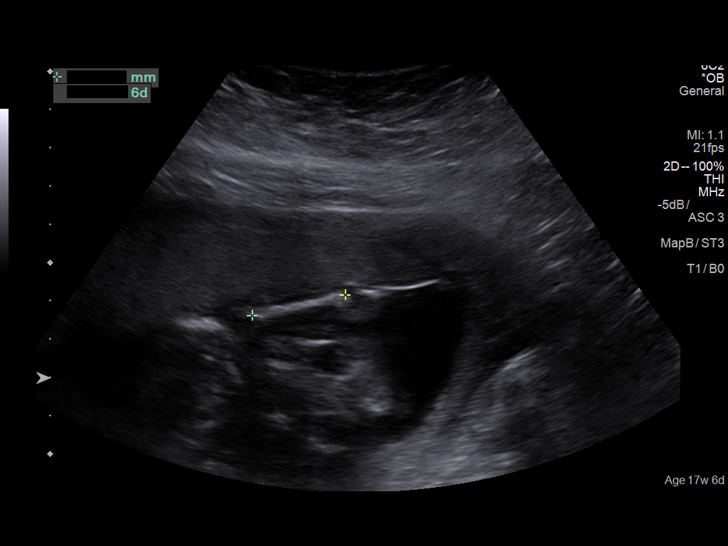
[im 52/72]
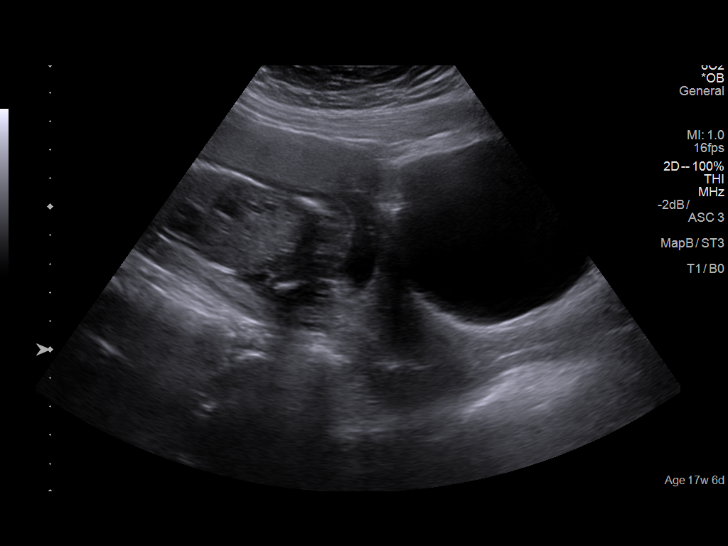
[im 61/72]
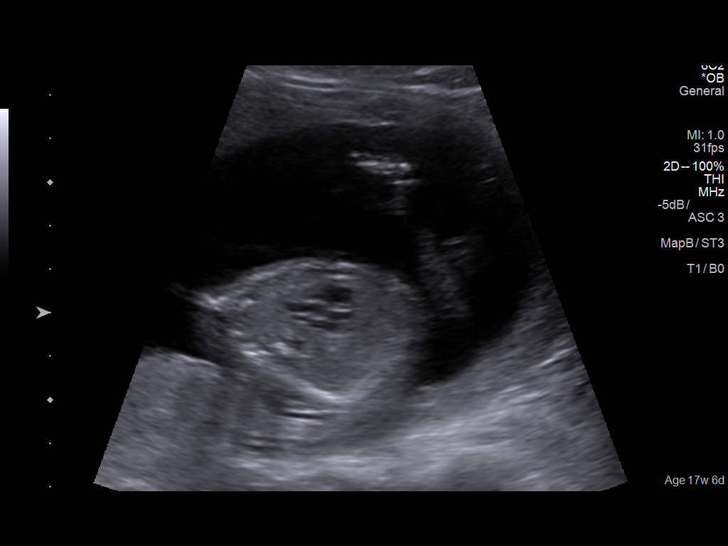
[im 66/72]
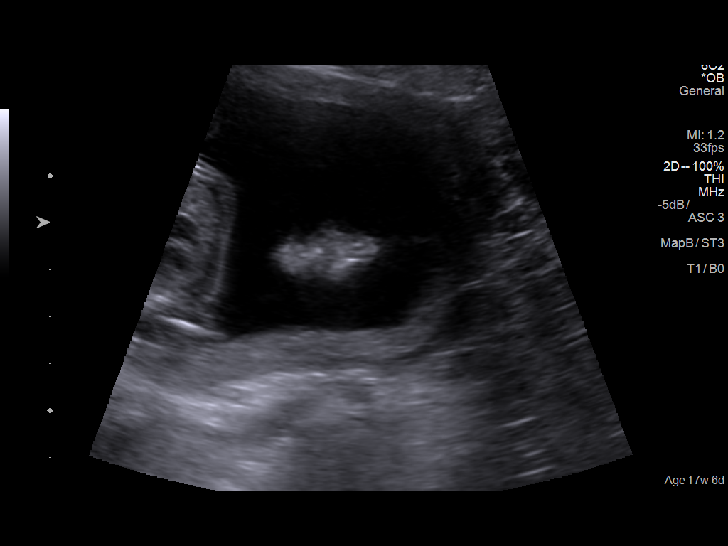
[im 72/72]
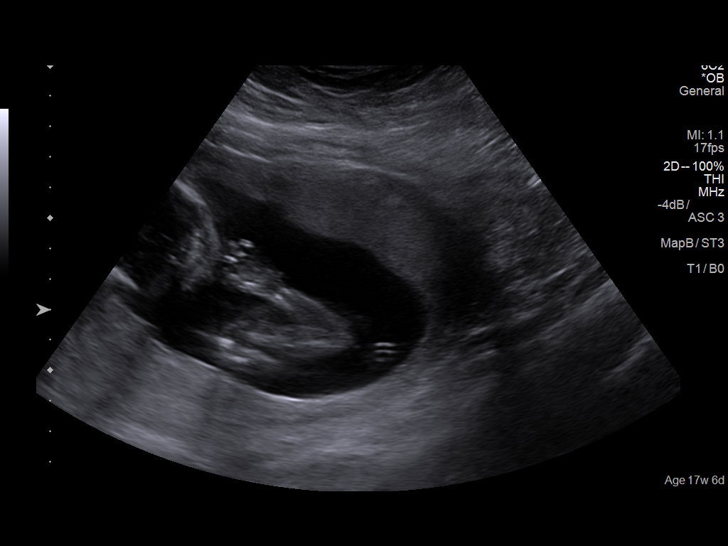

[12 of 28 positions shown; findings below may reference images not displayed]

IMPRESSION: Dear Dr.   IZABELA,

 Thank you for referring your patient to [facility] for a
 fetal anatomical survey.  She was originally referred to us in
 the first trimester for  genetic counseling and ultrasound due
 to abnormal first trimester screen results (elevated risk for
 T21 and T[REDACTED]). She elected to have cffDNA
 screening which was negative for aneuploidy and consistent
 with 46XY.

 There is a singleton gestation at 56w3d by LMP and US at
 [facility] on 12/26/18 at 9w3d.

 The fetal biometry correlates with established dating.

 Detailed evaluation of the fetal anatomy was performed.  The
 fetal anatomical survey appears within normal limits.

RECOMMENDATIONS:

 The patient met briefly with our genetic counselor.  The
 patient was offered MSAFP testing and declined.

 Thank you for allowing us to participate in your patient's care.

 assistance.

## 2020-04-09 ENCOUNTER — Ambulatory Visit: Payer: Managed Care, Other (non HMO) | Admitting: Certified Nurse Midwife

## 2020-04-09 ENCOUNTER — Other Ambulatory Visit: Payer: Self-pay

## 2020-04-09 ENCOUNTER — Encounter: Payer: Self-pay | Admitting: Certified Nurse Midwife

## 2020-04-09 ENCOUNTER — Other Ambulatory Visit (INDEPENDENT_AMBULATORY_CARE_PROVIDER_SITE_OTHER): Payer: Managed Care, Other (non HMO)

## 2020-04-09 ENCOUNTER — Other Ambulatory Visit (HOSPITAL_COMMUNITY)
Admission: RE | Admit: 2020-04-09 | Discharge: 2020-04-09 | Disposition: A | Discharge status: Home / Self Care | Payer: Managed Care, Other (non HMO) | Source: Ambulatory Visit | Attending: Certified Nurse Midwife | Admitting: Certified Nurse Midwife

## 2020-04-09 VITALS — BP 109/78 | HR 85 | Ht 63.0 in | Wt 218.1 lb

## 2020-04-09 DIAGNOSIS — R102 Pelvic and perineal pain: Secondary | ICD-10-CM | POA: Insufficient documentation

## 2020-04-09 DIAGNOSIS — N949 Unspecified condition associated with female genital organs and menstrual cycle: Secondary | ICD-10-CM | POA: Diagnosis present

## 2020-04-09 LAB — POCT URINALYSIS DIPSTICK
Bilirubin, UA: NEGATIVE
Glucose, UA: NEGATIVE
Ketones, UA: NEGATIVE
Leukocytes, UA: NEGATIVE
Nitrite, UA: NEGATIVE
Protein, UA: NEGATIVE
Spec Grav, UA: 1.02 (ref 1.010–1.025)
Urobilinogen, UA: 0.2 E.U./dL
pH, UA: 5 (ref 5.0–8.0)

## 2020-04-09 MED ORDER — NORETHINDRONE-ETH ESTRADIOL 1-35 MG-MCG PO TABS: 1.0000 | ORAL_TABLET | Freq: Every day | ORAL | 11 refills | Status: DC

## 2020-04-09 NOTE — Progress Notes (Signed)
GYN ENCOUNTER NOTE  Subjective:       Shannon [name] is a 29 y.o. [phone] female is here for gynecologic evaluation of the following issues:  1. Pelvic pain that started on Monday. She states it starts in the center below her belly and radiates to the sides and her back. The pain was so bad yesterday that she was bending over in pain. It radiates to stabbing pain in the vaginal and up her back. She denies any abnormal discharge, denies that it is related to her period. She denies any recent trama to her stomach. She denies burning with urination, frequency or urgency. She denies any bowel symptoms. She states that her period is irregular since she delivered even with the birth control she has break trough bleeding and will have bleeding every 2-3 wks, then skipped last month . She took a pregnancy test (it was negative) because it skipped. She eventually had one a few wks late.    Gynecologic History No LMP recorded. (Menstrual status: Oral contraceptives). Contraception: OCP (estrogen/progesterone) Last Pap: 07/27/2017. Results were: normal Last mammogram: n/a  Obstetric History OB History  Gravida Para Term Preterm AB Living  3 2 2  0 1 2  SAB TAB Ectopic Multiple Live Births  1 0 0 0 2    # Outcome Date GA Lbr Len/2nd Weight Sex Delivery Anes PTL Lv  3 Term 07/26/19 [redacted]w[redacted]d / 00:33 8 lb 3.2 oz (3.72 kg) M Vag-Spont EPI  LIV  2 SAB 2019          1 Term 05/21/16 [redacted]w[redacted]d 302:24 / 01:01 7 lb 14.6 oz (3.59 kg) F Vag-Spont EPI  LIV    Past Medical History:  Diagnosis Date  . Anxiety   . Depression   . Migraines   . Post partum depression     Past Surgical History:  Procedure Laterality Date  . CHOLECYSTECTOMY    . FOOT SURGERY Left   . KNEE SURGERY Left     Current Outpatient Medications on File Prior to Visit  Medication Sig Dispense Refill  . norethindrone-ethinyl estradiol (LOESTRIN FE) 1-20 MG-MCG tablet Take 1 tablet by mouth daily. 1 Package 11  . sertraline (ZOLOFT) 50 MG  tablet Take 1.5 tablets (75 mg total) by mouth daily. 45 tablet 4   No current facility-administered medications on file prior to visit.    Allergies  Allergen Reactions  . Robitussin (Alcohol Free) [Guaifenesin] Itching    Took Benadryl, helped    Social History   Socioeconomic History  . Marital status: Married    Spouse name: Jacolyn Joaquin  . Number of children: Not on file  . Years of education: Not on file  . Highest education level: Not on file  Occupational History  . Occupation: [employer]  Tobacco Use  . Smoking status: Never Smoker  . Smokeless tobacco: Never Used  Substance and Sexual Activity  . Alcohol use: No    Comment: occasional, while not pregnant  . Drug use: No  . Sexual activity: Yes    Birth control/protection: Other-see comments    Comment: Still deciding  Other Topics Concern  . Not on file  Social History Narrative  . Not on file   Social Determinants of Health   Financial Resource Strain: Unknown  . Difficulty of Paying Living Expenses: Patient refused  Food Insecurity: Unknown  . Worried About International aid/development worker in the Last Year: Patient refused  . Ran Out of Food in the Last Year: Patient  refused  Transportation Needs: Unknown  . Lack of Transportation (Medical): Patient refused  . Lack of Transportation (Non-Medical): Patient refused  Physical Activity: Unknown  . Days of Exercise per Week: Patient refused  . Minutes of Exercise per Session: Patient refused  Stress:   . Feeling of Stress :   Social Connections: Unknown  . Frequency of Communication with Friends and Family: Patient refused  . Frequency of Social Gatherings with Friends and Family: Patient refused  . Attends Religious Services: Patient refused  . Active Member of Clubs or Organizations: Patient refused  . Attends Banker Meetings: Patient refused  . Marital Status: Patient refused  Intimate Partner Violence: Unknown  . Fear of Current or  Ex-Partner: Patient refused  . Emotionally Abused: Patient refused  . Physically Abused: Patient refused  . Sexually Abused: Patient refused    Family History  Problem Relation Age of Onset  . Migraines Mother   . Cancer Maternal Grandmother   . Cancer Maternal Grandfather   . Hypertension Paternal Grandfather   . Diabetes Maternal Aunt   . Diabetes Maternal Uncle   . Diabetes Maternal Aunt     The following portions of the patient's history were reviewed and updated as appropriate: allergies, current medications, past family history, past medical history, past social history, past surgical history and problem list.  Review of Systems Review of Systems - Negative except as mentioned in HPI Review of Systems - General ROS: negative for - chills, fatigue, fever, hot flashes, malaise or night sweats Hematological and Lymphatic ROS: negative for - bleeding problems or swollen lymph nodes Gastrointestinal ROS: negative for - abdominal pain, blood in stools, change in bowel habits and nausea/vomiting Musculoskeletal ROS: negative for - joint pain, muscle pain or muscular weakness Genito-Urinary ROS: negative for - change in menstrual cycle, dysmenorrhea, dyspareunia, dysuria, genital discharge, genital ulcers, hematuria, incontinence, irregular/heavy menses, nocturia. Positive for pelvic pain   Objective:   There were no vitals taken for this visit. CONSTITUTIONAL: Well-developed, well-nourished female in no acute distress.  HENT:  Normocephalic, atraumatic.  NECK: Normal range of motion, supple, no masses.  Normal thyroid.  SKIN: Skin is warm and dry. No rash noted. Not diaphoretic. No erythema. No pallor. NEUROLGIC: Alert and oriented to person, place, and time. PSYCHIATRIC: Normal mood and affect. Normal behavior. Normal judgment and thought content. CARDIOVASCULAR:Not Examined RESPIRATORY: Not Examined BREASTS: Not Examined ABDOMEN: Soft, non distended; Non tender.  No  Organomegaly. PELVIC:  External Genitalia: Normal  BUS: Normal  Vagina: Normal  Cervix: Normal appearance and discharge, positive cervical motion tenderness  Uterus:  Size difficult to assess due to body habitus, shape,consistency, mobile  Adnexa: Normal, tenderness on right side with exam   RV: Normal   Bladder: Nontender MUSCULOSKELETAL: Normal range of motion. No tenderness.  No cyanosis, clubbing, or edema.  Patient Name: CERYS WINGET DOB: 10/02/1991 MRN: [id number]  ULTRASOUND REPORT  Location: [facility]  Date of Service: 04/09/2020     Indications:Pelvic Pain Findings:  The uterus is anteverted and measures 7.9 x 4.1 x 5.9 cm. Echo texture is homogenous without evidence of focal masses.  The Endometrium measures 5 mm.  Right Ovary measures 3.5 x 2.0 x 2.6 cm. It is normal in appearance.Hypoechoic lesion with good through transmission measuring 2.4 x 1.5 x 1.3 cm Left Ovary measures 2.9 x 1.8 x 2.1 cm. It is normal in appearance. Survey of the adnexa demonstrates no adnexal masses. There is no free fluid in the cul  de sac.  Impression: 1. Rt ovarian simple cyst as described above. 2.Survey of the adnexa demonstrates no adnexal masses. Recommendations: 1.Clinical correlation with the patient's History and Physical Exam.   Jenine M. Albertine Grates    RDMS  Assessment:   Pelvic pain    Plan:   Vaginal swab collected. Urine culture, U/s ordered. Discussed possible causes of pelvic pain including infection , and ovarian cysts, bladder infection, or GI causes. She admits that she has history of suspected endometriosis but has never been dx with it. She denies any history of surgery to her uterus, ovaries , or cervix.   U/s results reviewed. Rt ovarian cyst seen. Discussed motrin, tylenol, warm baths, and heating pad for pain. Follow up u/s 3-6 months to evaluate change.   Discussed change of birth control to different pill to improve bleeding profile. Pt agrees to  plan. Orders placed for ortho-novum 1/35. Pt to complete current pill pack then switch to new pill. She verbalizes and agrees to plan.   Will follow up with swab and urine culture.  Red flag symptoms reviewed.   Philip Aspen, CNM

## 2020-04-09 NOTE — Patient Instructions (Signed)
Ovarian Cyst     An ovarian cyst is a fluid-filled sac that forms on an ovary. The ovaries are small organs that produce eggs in women. Various types of cysts can form on the ovaries. Some may cause symptoms and require treatment. Most ovarian cysts go away on their own, are not cancerous (are benign), and do not cause problems. Common types of ovarian cysts include:  Functional (follicle) cysts. ? Occur during the menstrual cycle, and usually go away with the next menstrual cycle if you do not get pregnant. ? Usually cause no symptoms.  Endometriomas. ? Are cysts that form from the tissue that lines the uterus (endometrium). ? Are sometimes called "chocolate cysts" because they become filled with blood that turns brown. ? Can cause pain in the lower abdomen during intercourse and during your period.  Cystadenoma cysts. ? Develop from cells on the outside surface of the ovary. ? Can get very large and cause lower abdomen pain and pain with intercourse. ? Can cause severe pain if they twist or break open (rupture).  Dermoid cysts. ? Are sometimes found in both ovaries. ? May contain different kinds of body tissue, such as skin, teeth, hair, or cartilage. ? Usually do not cause symptoms unless they get very big.  Theca lutein cysts. ? Occur when too much of a certain hormone (human chorionic gonadotropin) is produced and overstimulates the ovaries to produce an egg. ? Are most common after having procedures used to assist with the conception of a baby (in vitro fertilization). What are the causes? Ovarian cysts may be caused by:  Ovarian hyperstimulation syndrome. This is a condition that can develop from taking fertility medicines. It causes multiple large ovarian cysts to form.  Polycystic ovarian syndrome (PCOS). This is a common hormonal disorder that can cause ovarian cysts, as well as problems with your period or fertility. What increases the risk? The following factors may  make you more likely to develop ovarian cysts:  Being overweight or obese.  Taking fertility medicines.  Taking certain forms of hormonal birth control.  Smoking. What are the signs or symptoms? Many ovarian cysts do not cause symptoms. If symptoms are present, they may include:  Pelvic pain or pressure.  Pain in the lower abdomen.  Pain during sex.  Abdominal swelling.  Abnormal menstrual periods.  Increasing pain with menstrual periods. How is this diagnosed? These cysts are commonly found during a routine pelvic exam. You may have tests to find out more about the cyst, such as:  Ultrasound.  X-ray of the pelvis.  CT scan.  MRI.  Blood tests. How is this treated? Many ovarian cysts go away on their own without treatment. Your health care provider may want to check your cyst regularly for 2-3 months to see if it changes. If you are in menopause, it is especially important to have your cyst monitored closely because menopausal women have a higher rate of ovarian cancer. When treatment is needed, it may include:  Medicines to help relieve pain.  A procedure to drain the cyst (aspiration).  Surgery to remove the whole cyst.  Hormone treatment or birth control pills. These methods are sometimes used to help dissolve a cyst. Follow these instructions at home:  Take over-the-counter and prescription medicines only as told by your health care provider.  Do not drive or use heavy machinery while taking prescription pain medicine.  Get regular pelvic exams and Pap tests as often as told by your health care provider.    Return to your normal activities as told by your health care provider. Ask your health care provider what activities are safe for you.  Do not use any products that contain nicotine or tobacco, such as cigarettes and e-cigarettes. If you need help quitting, ask your health care provider.  Keep all follow-up visits as told by your health care provider.  This is important. Contact a health care provider if:  Your periods are late, irregular, or painful, or they stop.  You have pelvic pain that does not go away.  You have pressure on your bladder or trouble emptying your bladder completely.  You have pain during sex.  You have any of the following in your abdomen: ? A feeling of fullness. ? Pressure. ? Discomfort. ? Pain that does not go away. ? Swelling.  You feel generally ill.  You become constipated.  You lose your appetite.  You develop severe acne.  You start to have more body hair and facial hair.  You are gaining weight or losing weight without changing your exercise and eating habits.  You think you may be pregnant. Get help right away if:  You have abdominal pain that is severe or gets worse.  You cannot eat or drink without vomiting.  You suddenly develop a fever.  Your menstrual period is much heavier than usual. This information is not intended to replace advice given to you by your health care provider. Make sure you discuss any questions you have with your health care provider. Document Revised: 03/13/2018 Document Reviewed: 05/16/2016 Elsevier Patient Education  2020 Elsevier Inc.  

## 2020-04-11 LAB — CERVICOVAGINAL ANCILLARY ONLY
Bacterial Vaginitis (gardnerella): NEGATIVE
Candida Glabrata: NEGATIVE
Candida Vaginitis: NEGATIVE
Chlamydia: NEGATIVE
Comment: NEGATIVE
Comment: NEGATIVE
Comment: NEGATIVE
Comment: NEGATIVE
Comment: NEGATIVE
Comment: NORMAL
Neisseria Gonorrhea: NEGATIVE
Trichomonas: NEGATIVE

## 2020-04-11 LAB — URINE CULTURE

## 2020-04-21 ENCOUNTER — Other Ambulatory Visit: Payer: Self-pay | Admitting: Certified Nurse Midwife

## 2020-04-21 DIAGNOSIS — R109 Unspecified abdominal pain: Secondary | ICD-10-CM

## 2020-04-21 NOTE — Progress Notes (Signed)
Kerly,

## 2020-05-13 ENCOUNTER — Ambulatory Visit (INDEPENDENT_AMBULATORY_CARE_PROVIDER_SITE_OTHER): Payer: Managed Care, Other (non HMO) | Admitting: Gastroenterology

## 2020-05-13 ENCOUNTER — Encounter: Payer: Self-pay | Admitting: Gastroenterology

## 2020-05-13 ENCOUNTER — Other Ambulatory Visit: Payer: Self-pay

## 2020-05-13 VITALS — BP 124/82 | HR 82 | Temp 98.2°F | Ht 63.0 in | Wt 216.6 lb

## 2020-05-13 DIAGNOSIS — R748 Abnormal levels of other serum enzymes: Secondary | ICD-10-CM

## 2020-05-13 DIAGNOSIS — R197 Diarrhea, unspecified: Secondary | ICD-10-CM | POA: Diagnosis not present

## 2020-05-13 DIAGNOSIS — R1013 Epigastric pain: Secondary | ICD-10-CM | POA: Diagnosis not present

## 2020-05-13 MED ORDER — OMEPRAZOLE 40 MG PO CPDR: 40.0000 mg | DELAYED_RELEASE_CAPSULE | Freq: Every day | ORAL | 0 refills | Status: DC

## 2020-05-13 NOTE — Patient Instructions (Signed)
Lactose Intolerance, Adult Lactose is a natural sugar that is found in dairy milk and dairy products such as cheese and yogurt. Lactose is digested by lactase, a protein (enzyme) in your small intestine. Some people do not produce enough lactase to digest lactose. This is called lactose intolerance. Lactose intolerance is different from milk allergy, which is a more serious reaction to the protein in milk. What are the causes? Causes of lactose intolerance may include:  Normal aging. The ability to produce lactase may lessen with age, causing lactose intolerance over time.  Being born without the ability to make lactase.  Digestive diseases such as gastroenteritis or inflammatory bowel disease (IBD).  Surgery or injury to your small intestine.  Infection in your intestines.  Certain antibiotic medicines and cancer treatments. What are the signs or symptoms? Lactose intolerance can cause discomfort within 30 minutes to 2 hours after you eat or drink something that contains lactose. Symptoms may include:  Nausea.  Diarrhea.  Cramps or pain in the abdomen.  A full, tight, or painful feeling in the abdomen (bloating).  Gas. How is this diagnosed? This condition may be diagnosed based on:  Your symptoms and medical history.  Lactose tolerance test. This test involves drinking a lactose solution and then having blood tests to measure the amount of glucose in your blood. If your blood glucose level does not go up, it means your body is not able to digest the lactose.  Lactose breath test (hydrogen breath test). This test involves drinking a lactose solution and then exhaling into a type of bag while you digest the solution. Having a lot of hydrogen in your breath can be a sign of lactose intolerance.  Stool acidity test. This involves drinking a lactose solution and then having your stool samples tested for bacteria. Having a lot of bacteria causes stool to be considered acidic, which  is a sign of lactose intolerance. How is this treated? There is no treatment to improve your body's ability to produce lactase. However, you can manage your symptoms at home by:  Limiting or avoiding dairy milk, dairy products, and other sources of lactose.  Taking lactase tablets when you eat milk products. Lactase tablets are over-the-counter medicines that help to improve lactose digestion. You may also add lactase drops to regular milk.  Adjusting your diet, such as drinking lactose-free milk. Lactose tolerance varies from person to person. Some people may be able to eat or drink small amounts of products that contain lactose, and other people may need to avoid all foods and drinks that contain lactose. Talk with your health care provider about what treatment is best for you. Follow these instructions at home:  Limit or avoid foods, beverages, and medicines that contain lactose, as told by your health care provider. Keep track of which foods, beverages, or medicines cause symptoms so you can decide what to avoid in the future.  Read food and medicine labels carefully. Avoid products that contain: ? Lactose. ? Milk solids. ? Casein. ? Whey.  Take over-the-counter and prescription medicines (including lactase tablets) only as told by your health care provider.  If you stop eating and drinking dairy products (eliminate dairy from your diet), make sure to get enough protein, calcium, and vitamin D from other foods. Work with your health care provider or a diet and nutrition specialist (dietitian) to make sure you get enough of those nutrients.  Choose a milk substitute that is fortified with calcium and vitamin D. ? Soy milk   contains high-quality protein. ? Milks that are made from nuts or grains (such as almond milk and rice milk) contain very small amounts of protein.  Keep all follow-up visits as told by your health care provider. This is important. Contact a health care provider  if:  You have no relief from your symptoms after you have eliminated milk products and other sources of lactose. Get help right away if:  You have blood in your stool.  You have severe abdomen (abdominal) pain. Summary  Lactose is a natural sugar that is found in dairy milk and dairy products such as cheese and yogurt. Lactose is digested by lactase, which is a protein (enzyme) in the small intestine.  Some people do not produce enough lactase to digest lactose. This is called lactose intolerance.  Lactose intolerance can cause discomfort within 30 minutes to 2 hours after you eat or drink something that contains lactose.  Limit or avoid foods, beverages, and medicines that contain lactose, as told by your health care provider. This information is not intended to replace advice given to you by your health care provider. Make sure you discuss any questions you have with your health care provider. Document Revised: 11/25/2017 Document Reviewed: 07/29/2017 Elsevier Patient Education  2020 [employer] Inc.  

## 2020-05-14 NOTE — Progress Notes (Signed)
Melodie Bouillon [address]  [address]  [place], [place] [address]  Main: [phone]  Fax: [phone]   Gastroenterology Consultation  Referring Provider:     Doreene Burke, CNM Primary Care Physician:  Patient, No Pcp Per Reason for Consultation:    Abdominal pain        HPI:    Chief Complaint  Patient presents with  . New Patient (Initial Visit)  . Abdominal Pain    Patient stated that she had been having abdminal cramping.  . Constipation    Patient stated that she has had constipation and strains.    Maggi Hershkowitz Collier is a 29 y.o. y/o female referred for consultation & management  by Dr. Patient, No Pcp Per.  Patient reports history of a "sensitive stomach", states has to go to the bathroom and have a bowel movement every time she eats.  3-4 bowel movements a day, with no blood.  Epigastric abdominal pain, for 5 to 6 months, dull, nonradiating, not associated with nausea or vomiting.  States she is not pregnant as she uses birth control.  No prior EGD or colonoscopy.  Reports significant symptoms with dairy products.  Has tried gluten free, but not consistently and questions if she has gluten intolerance  Past Medical History:  Diagnosis Date  . Anxiety   . Depression   . Migraines   . Post partum depression     Past Surgical History:  Procedure Laterality Date  . CHOLECYSTECTOMY    . FOOT SURGERY Left   . KNEE SURGERY Left     Prior to Admission medications   Medication Sig Start Date End Date Taking? Authorizing Provider  norethindrone-ethinyl estradiol 1/35 (ORTHO-NOVUM) tablet Take 1 tablet by mouth daily. 04/09/20  Yes Doreene Burke, CNM  omeprazole (PRILOSEC) 40 MG capsule Take 1 capsule (40 mg total) by mouth daily. 05/13/20   Pasty Spillers, MD    Family History  Problem Relation Age of Onset  . Migraines Mother   . Cancer Maternal Grandmother   . Cancer Maternal Grandfather   . Hypertension Paternal Grandfather   . Diabetes  Maternal Aunt   . Diabetes Maternal Uncle   . Diabetes Maternal Aunt      Social History   Tobacco Use  . Smoking status: Never Smoker  . Smokeless tobacco: Never Used  Substance Use Topics  . Alcohol use: No    Comment: occasional, while not pregnant  . Drug use: No    Allergies as of 05/13/2020 - Review Complete 05/13/2020  Allergen Reaction Noted  . Robitussin (alcohol free) [guaifenesin] Itching 04/01/2016    Review of Systems:    All systems reviewed and negative except where noted in HPI.   Physical Exam:  BP 124/82   Pulse 82   Temp 98.2 F (36.8 C) (Oral)   Ht 5\' 3"  (1.6 m)   Wt 216 lb 9.6 oz (98.2 kg)   BMI 38.37 kg/m  No LMP recorded. (Menstrual status: Irregular Periods). Psych:  Alert and cooperative. Normal mood and affect. General:   Alert,  Well-developed, well-nourished, pleasant and cooperative in NAD Head:  Normocephalic and atraumatic. Eyes:  Sclera clear, no icterus.   Conjunctiva pink. Ears:  Normal auditory acuity. Nose:  No deformity, discharge, or lesions. Mouth:  No deformity or lesions,oropharynx pink & moist. Neck:  Supple; no masses or thyromegaly. Abdomen:  Normal bowel sounds.  No bruits.  Soft, non-tender and non-distended without masses, hepatosplenomegaly or hernias noted.  No  guarding or rebound tenderness.    Msk:  Symmetrical without gross deformities. Good, equal movement & strength bilaterally. Pulses:  Normal pulses noted. Extremities:  No clubbing or edema.  No cyanosis. Neurologic:  Alert and oriented x3;  grossly normal neurologically. Skin:  Intact without significant lesions or rashes. No jaundice. Lymph Nodes:  No significant cervical adenopathy. Psych:  Alert and cooperative. Normal mood and affect.   Labs: CBC    Component Value Date/Time   WBC 10.4 07/27/2019 0734   RBC 3.93 07/27/2019 0734   HGB 9.9 (L) 07/27/2019 0734   HGB 12.3 05/10/2019 1012   HGB 13.6 10/03/2015 0000   HCT 31.5 (L) 07/27/2019 0734    HCT 37.0 05/10/2019 1012   HCT 40 10/03/2015 0000   PLT 344 07/27/2019 0734   PLT 394 05/10/2019 1012   PLT 376 10/03/2015 0000   MCV 80.2 07/27/2019 0734   MCV 83 05/10/2019 1012   MCV 89 11/01/2014 0655   MCH 25.2 (L) 07/27/2019 0734   MCHC 31.4 07/27/2019 0734   RDW 16.0 (H) 07/27/2019 0734   RDW 12.3 05/10/2019 1012   RDW 12.6 11/01/2014 0655   LYMPHSABS 1.9 06/29/2019 2206   LYMPHSABS 3.1 11/01/2014 0655   MONOABS 0.8 06/29/2019 2206   MONOABS 0.6 11/01/2014 0655   EOSABS 0.0 06/29/2019 2206   EOSABS 0.2 11/01/2014 0655   BASOSABS 0.0 06/29/2019 2206   BASOSABS 0.0 11/01/2014 0655   CMP     Component Value Date/Time   NA 136 06/30/2019 1002   NA 138 06/17/2014 1402   K 3.4 (L) 06/30/2019 1002   K 3.8 06/17/2014 1402   CL 105 06/30/2019 1002   CL 104 06/17/2014 1402   CO2 20 (L) 06/30/2019 1002   CO2 28 06/17/2014 1402   GLUCOSE 83 06/30/2019 1002   GLUCOSE 102 (H) 06/17/2014 1402   BUN <5 (L) 06/30/2019 1002   BUN 7 06/17/2014 1402   CREATININE 0.54 06/30/2019 1002   CREATININE 0.77 06/17/2014 1402   CALCIUM 8.5 (L) 06/30/2019 1002   CALCIUM 9.6 06/17/2014 1402   PROT 6.4 (L) 05/13/2016 1831   PROT 7.8 06/09/2013 1639   ALBUMIN 2.6 (L) 05/13/2016 1831   ALBUMIN 3.8 06/09/2013 1639   AST 34 05/13/2016 1831   AST 26 06/09/2013 1639   ALT 37 05/13/2016 1831   ALT 27 06/09/2013 1639   ALKPHOS 210 (H) 05/13/2016 1831   ALKPHOS 90 06/09/2013 1639   BILITOT 0.9 05/13/2016 1831   BILITOT 0.9 06/09/2013 1639   GFRNONAA >60 06/30/2019 1002   GFRNONAA >60 06/17/2014 1402   GFRAA >60 06/30/2019 1002   GFRAA >60 06/17/2014 1402    Imaging Studies: No results found.  Assessment and Plan:   SEDALIA GREESON is a 29 y.o. y/o female has been referred for abdominal pain  Obtain H. pylori breath test Start PPI after completing H. pylori test  Also obtain celiac panel and fecal calprotectin  If fecal calprotectin elevated, would recommend colonoscopy  Last  labs were done when she was pregnant and she was anemic at that time   Repeat CBC on next visit if symptoms continue  Dr Vonda Antigua  Speech recognition software was used to dictate the above note.

## 2020-05-14 NOTE — Addendum Note (Signed)
Addended by: Melodie Bouillon on: 05/14/2020 01:49 PM   Modules accepted: Orders

## 2020-05-15 ENCOUNTER — Telehealth: Payer: Self-pay

## 2020-05-15 ENCOUNTER — Other Ambulatory Visit: Payer: Self-pay | Admitting: Gastroenterology

## 2020-05-15 LAB — CELIAC DISEASE AB SCREEN W/RFX
Antigliadin Abs, IgA: 4 units (ref 0–19)
IgA/Immunoglobulin A, Serum: 203 mg/dL (ref 87–352)
Transglutaminase IgA: <2 U/mL (ref 0–3)

## 2020-05-15 LAB — C-REACTIVE PROTEIN: CRP: 13 mg/L — ABNORMAL HIGH (ref 0–10)

## 2020-05-15 LAB — H. PYLORI BREATH TEST: H pylori Breath Test: NEGATIVE

## 2020-05-15 NOTE — Telephone Encounter (Signed)
-----   Message from Pasty Spillers, MD sent at 05/14/2020  1:49 PM EDT ----- Kandis Cocking please let the patient know, because of her inflammatory marker being elevated, that is her CRP, I have ordered further lab tests for evaluation.

## 2020-05-15 NOTE — Telephone Encounter (Signed)
Called patient and was able to let her know that Dr. Maximino Greenland wanted me to tell her that her CRP was elevated. I told the patient that is a protein made by her liver and that it's sent into her bloodstream in response to inflammation. Therefore, she needed more tests so Dr. Maximino Greenland would see where it could be coming from. Patient agreed and stated that she would be coming in today and have her labs drawn. Patient had no further questions.

## 2020-05-16 LAB — COMPREHENSIVE METABOLIC PANEL
ALT: 30 IU/L (ref 0–32)
AST: 22 IU/L (ref 0–40)
Albumin/Globulin Ratio: 1.6 (ref 1.2–2.2)
Albumin: 4.7 g/dL (ref 3.9–5.0)
Alkaline Phosphatase: 131 IU/L — ABNORMAL HIGH (ref 48–121)
BUN/Creatinine Ratio: 10 (ref 9–23)
BUN: 7 mg/dL (ref 6–20)
Bilirubin Total: 0.4 mg/dL (ref 0.0–1.2)
CO2: 23 mmol/L (ref 20–29)
Calcium: 9.7 mg/dL (ref 8.7–10.2)
Chloride: 102 mmol/L (ref 96–106)
Creatinine, Ser: 0.72 mg/dL (ref 0.57–1.00)
GFR calc Af Amer: 132 mL/min/{1.73_m2} (ref >59)
GFR calc non Af Amer: 114 mL/min/{1.73_m2} (ref >59)
Globulin, Total: 2.9 g/dL (ref 1.5–4.5)
Glucose: 85 mg/dL (ref 65–99)
Potassium: 4.6 mmol/L (ref 3.5–5.2)
Sodium: 138 mmol/L (ref 134–144)
Total Protein: 7.6 g/dL (ref 6.0–8.5)

## 2020-05-16 LAB — CBC
Hematocrit: 43.2 % (ref 34.0–46.6)
Hemoglobin: 13.7 g/dL (ref 11.1–15.9)
MCH: 27.9 pg (ref 26.6–33.0)
MCHC: 31.7 g/dL (ref 31.5–35.7)
MCV: 88 fL (ref 79–97)
Platelets: 427 10*3/uL (ref 150–450)
RBC: 4.91 x10E6/uL (ref 3.77–5.28)
RDW: 13.2 % (ref 11.7–15.4)
WBC: 9.8 10*3/uL (ref 3.4–10.8)

## 2020-05-16 NOTE — Addendum Note (Signed)
Addended by: Melodie Bouillon on: 05/16/2020 09:38 AM   Modules accepted: Orders

## 2020-05-21 ENCOUNTER — Telehealth: Payer: Self-pay

## 2020-05-21 LAB — CALPROTECTIN, FECAL: Calprotectin, Fecal: 25 ug/g (ref 0–120)

## 2020-05-21 NOTE — Telephone Encounter (Signed)
Called patient and had to leave her a voicemail to return my call. I will also send her a MyChart message since she had MyChart.

## 2020-05-21 NOTE — Telephone Encounter (Signed)
Patient read my message on MyChart and awaiting on her to let me know if she would proceed with me scheduling her EGD and Colonoscopy per Dr. Maximino Greenland.

## 2020-06-05 ENCOUNTER — Other Ambulatory Visit: Payer: Self-pay | Admitting: Gastroenterology

## 2020-06-05 DIAGNOSIS — R1013 Epigastric pain: Secondary | ICD-10-CM

## 2020-06-11 LAB — SPECIMEN STATUS REPORT

## 2020-06-11 LAB — GAMMA GT: GGT: 46 IU/L (ref 0–60)

## 2020-06-24 ENCOUNTER — Other Ambulatory Visit: Payer: Self-pay | Admitting: Gastroenterology

## 2020-06-24 DIAGNOSIS — R1013 Epigastric pain: Secondary | ICD-10-CM

## 2020-07-01 ENCOUNTER — Other Ambulatory Visit: Payer: Self-pay

## 2020-07-01 ENCOUNTER — Ambulatory Visit: Payer: Managed Care, Other (non HMO) | Admitting: Physician Assistant

## 2020-07-01 ENCOUNTER — Encounter: Payer: Self-pay | Admitting: Physician Assistant

## 2020-07-01 ENCOUNTER — Ambulatory Visit: Payer: Managed Care, Other (non HMO) | Admitting: Gastroenterology

## 2020-07-01 VITALS — BP 122/66 | HR 70 | Temp 97.6°F | Resp 16 | Ht 63.0 in | Wt 210.0 lb

## 2020-07-01 DIAGNOSIS — Z0000 Encounter for general adult medical examination without abnormal findings: Secondary | ICD-10-CM | POA: Diagnosis not present

## 2020-07-01 DIAGNOSIS — Z008 Encounter for other general examination: Secondary | ICD-10-CM

## 2020-07-01 NOTE — Progress Notes (Signed)
Subjective:    Patient ID: Shannon [name], female    DOB: [birth year], 29 y.o.   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  Has 2 children 1 yo son and 108 yo daughter Has gained weight since delivery of children Remembers last labs as being indicative of "pre-DM' But has not changed diet or added exercise Today 210 lbs on 5'3" frame  Has family hx of DM, majority of adults +  Has hx GERD , uses Omeprazole- modified diet to reduce spice but still loves it.   Review of Systems Menses were twice monthly and heavy- now on BCPs and much better light short 4 days and once monthly. Undecided about additional pregnancies    Objective:   Physical Exam Constitutional:      General: She is not in acute distress.    Appearance: Normal appearance. She is obese.  HENT:     Head: Normocephalic and atraumatic.     Right Ear: Tympanic membrane, ear canal and external ear normal.     Left Ear: Tympanic membrane, ear canal and external ear normal.     Nose: Nose normal.     Mouth/Throat:     Mouth: Mucous membranes are moist.     Comments: Does not do dental care- afraid- denies hx of difficult visits Uses medium toothbrush No obvious caries  Encourage routine hygiene visits and daily/BID brushing  Eyes:     Extraocular Movements: Extraocular movements intact.     Conjunctiva/sclera: Conjunctivae normal.  Cardiovascular:     Rate and Rhythm: Normal rate and regular rhythm.     Pulses: Normal pulses.     Heart sounds: Normal heart sounds.  Pulmonary:     Effort: Pulmonary effort is normal.     Breath sounds: Normal breath sounds.  Abdominal:     General: Bowel sounds are normal.     Palpations: Abdomen is soft.     Tenderness: There is no abdominal tenderness. There is no guarding.     Comments: Rotund, no masses palpable, normal BS  Genitourinary:    Comments: Deferred Has Gyn  care Musculoskeletal:        General: Normal range of motion.     Cervical back: Normal range of motion.  Skin:    General: Skin is warm and dry.     Capillary Refill: Capillary refill takes less than 2 seconds.  Neurological:     General: No focal deficit present.     Mental Status: She is alert.     Deep Tendon Reflexes: Reflexes normal.  Psychiatric:        Mood and Affect: Mood normal.        Behavior: Behavior normal.      Assessment & Plan:  Office is one story building- no stairs-encourage 30 minute brisk walk daily broken into 2 x or 3 x as tolerated circling the parking lot or up and down in interior halls. Encourage others and make a sport of it Has NO exercise currently and anxious about attempting. Reviewed it as a walking program - steady not racing- consistency , daily , very important.  Discussed healthy food choices and portion control- she reports husband (also an Technical sales engineer)  and children are slender. She needs to really evaluate calories in vs. calories burned  Has no PCP and is given list of local offices accepting new patients. Is  familiar with [facility] for  her children and has heard of [facility]. Strongly encouraged to establish care now  Also request that she establish care with [facility] of choice-  Encourage her to tell of anxiety while making the appointment as the staff will support her as it is a common anxiety.  Will report labs as available . She has My Chart active by her report

## 2020-07-02 LAB — LIPID PANEL
Chol/HDL Ratio: 2.8 ratio (ref 0.0–4.4)
Cholesterol, Total: 150 mg/dL (ref 100–199)
HDL: 53 mg/dL (ref >39)
LDL Chol Calc (NIH): 73 mg/dL (ref 0–99)
Triglycerides: 137 mg/dL (ref 0–149)
VLDL Cholesterol Cal: 24 mg/dL (ref 5–40)

## 2020-07-02 LAB — GLUCOSE, RANDOM: Glucose: 111 mg/dL — ABNORMAL HIGH (ref 65–99)

## 2020-10-30 ENCOUNTER — Emergency Department
Admission: EM | Admit: 2020-10-30 | Discharge: 2020-10-30 | Disposition: A | Discharge status: Home / Self Care | Payer: Managed Care, Other (non HMO) | Attending: Emergency Medicine | Admitting: Emergency Medicine

## 2020-10-30 ENCOUNTER — Other Ambulatory Visit: Payer: Self-pay

## 2020-10-30 ENCOUNTER — Encounter: Payer: Self-pay | Admitting: *Deleted

## 2020-10-30 DIAGNOSIS — Y92010 Kitchen of single-family (private) house as the place of occurrence of the external cause: Secondary | ICD-10-CM | POA: Insufficient documentation

## 2020-10-30 DIAGNOSIS — T2122XA Burn of second degree of abdominal wall, initial encounter: Secondary | ICD-10-CM | POA: Diagnosis not present

## 2020-10-30 DIAGNOSIS — T2112XA Burn of first degree of abdominal wall, initial encounter: Secondary | ICD-10-CM | POA: Insufficient documentation

## 2020-10-30 DIAGNOSIS — T2120XA Burn of second degree of trunk, unspecified site, initial encounter: Secondary | ICD-10-CM

## 2020-10-30 DIAGNOSIS — Z23 Encounter for immunization: Secondary | ICD-10-CM | POA: Diagnosis not present

## 2020-10-30 DIAGNOSIS — Y93G1 Activity, food preparation and clean up: Secondary | ICD-10-CM | POA: Insufficient documentation

## 2020-10-30 DIAGNOSIS — X131XXA Other contact with steam and other hot vapors, initial encounter: Secondary | ICD-10-CM | POA: Insufficient documentation

## 2020-10-30 DIAGNOSIS — T23072A Burn of unspecified degree of left wrist, initial encounter: Secondary | ICD-10-CM | POA: Insufficient documentation

## 2020-10-30 DIAGNOSIS — T2102XA Burn of unspecified degree of abdominal wall, initial encounter: Secondary | ICD-10-CM | POA: Diagnosis present

## 2020-10-30 MED ORDER — SODIUM CHLORIDE 0.9 % IV BOLUS
1000.0000 mL | Freq: Once | INTRAVENOUS | Status: AC
  Administered 2020-10-30: 1000 mL via INTRAVENOUS

## 2020-10-30 MED ORDER — SILVER SULFADIAZINE 1 % EX CREA: TOPICAL_CREAM | CUTANEOUS | 1 refills | Status: DC

## 2020-10-30 MED ORDER — OXYCODONE-ACETAMINOPHEN 5-325 MG PO TABS
1.0000 | ORAL_TABLET | Freq: Four times a day (QID) | ORAL | 0 refills | Status: AC | PRN
Start: 2020-10-30 — End: 2020-11-01

## 2020-10-30 MED ORDER — SILVER SULFADIAZINE 1 % EX CREA
TOPICAL_CREAM | Freq: Once | CUTANEOUS | Status: AC
  Administered 2020-10-30: 1 via TOPICAL

## 2020-10-30 MED ORDER — TETANUS-DIPHTH-ACELL PERTUSSIS 5-2.5-18.5 LF-MCG/0.5 IM SUSY
0.5000 mL | PREFILLED_SYRINGE | Freq: Once | INTRAMUSCULAR | Status: AC
  Administered 2020-10-30: 0.5 mL via INTRAMUSCULAR
  Filled 2020-10-30: qty 0.5

## 2020-10-30 MED ORDER — ONDANSETRON HCL 4 MG/2ML IJ SOLN
4.0000 mg | Freq: Once | INTRAMUSCULAR | Status: AC
  Administered 2020-10-30: 4 mg via INTRAVENOUS
  Filled 2020-10-30: qty 2

## 2020-10-30 MED ORDER — MORPHINE SULFATE (PF) 4 MG/ML IV SOLN
4.0000 mg | Freq: Once | INTRAVENOUS | Status: AC
  Administered 2020-10-30: 4 mg via INTRAVENOUS
  Filled 2020-10-30: qty 1
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
## 2021-03-16 ENCOUNTER — Other Ambulatory Visit: Payer: Self-pay | Admitting: Certified Nurse Midwife

## 2021-03-24 ENCOUNTER — Other Ambulatory Visit: Payer: Self-pay

## 2021-04-11 ENCOUNTER — Other Ambulatory Visit: Payer: Self-pay | Admitting: Certified Nurse Midwife

## 2021-04-27 ENCOUNTER — Other Ambulatory Visit: Payer: Self-pay | Admitting: Certified Nurse Midwife

## 2021-04-29 ENCOUNTER — Encounter: Payer: Self-pay | Admitting: Certified Nurse Midwife

## 2021-04-29 ENCOUNTER — Other Ambulatory Visit (HOSPITAL_COMMUNITY)
Admission: RE | Admit: 2021-04-29 | Discharge: 2021-04-29 | Disposition: A | Discharge status: Home / Self Care | Payer: Managed Care, Other (non HMO) | Source: Ambulatory Visit | Attending: Certified Nurse Midwife | Admitting: Certified Nurse Midwife

## 2021-04-29 ENCOUNTER — Ambulatory Visit (INDEPENDENT_AMBULATORY_CARE_PROVIDER_SITE_OTHER): Payer: Managed Care, Other (non HMO) | Admitting: Certified Nurse Midwife

## 2021-04-29 ENCOUNTER — Other Ambulatory Visit: Payer: Self-pay

## 2021-04-29 VITALS — BP 113/76 | HR 81 | Resp 16 | Ht 63.0 in | Wt 214.4 lb

## 2021-04-29 DIAGNOSIS — Z01419 Encounter for gynecological examination (general) (routine) without abnormal findings: Secondary | ICD-10-CM | POA: Diagnosis not present

## 2021-04-29 DIAGNOSIS — R635 Abnormal weight gain: Secondary | ICD-10-CM

## 2021-04-29 DIAGNOSIS — Z1159 Encounter for screening for other viral diseases: Secondary | ICD-10-CM | POA: Diagnosis not present

## 2021-04-29 DIAGNOSIS — E669 Obesity, unspecified: Secondary | ICD-10-CM | POA: Diagnosis not present

## 2021-04-29 DIAGNOSIS — Z124 Encounter for screening for malignant neoplasm of cervix: Secondary | ICD-10-CM | POA: Insufficient documentation

## 2021-04-29 MED ORDER — ALYACEN 1/35 1-35 MG-MCG PO TABS
1.0000 | ORAL_TABLET | Freq: Every day | ORAL | 11 refills | Status: DC
Start: 2021-04-29 — End: 2021-12-25

## 2021-04-29 NOTE — Patient Instructions (Signed)
Preventive Care 21-30 Years Old, Female Preventive care refers to lifestyle choices and visits with your health care provider that can promote health and wellness. This includes:  A yearly physical exam. This is also called an annual wellness visit.  Regular dental and eye exams.  Immunizations.  Screening for certain conditions.  Healthy lifestyle choices, such as: ? Eating a healthy diet. ? Getting regular exercise. ? Not using drugs or products that contain nicotine and tobacco. ? Limiting alcohol use. What can I expect for my preventive care visit? Physical exam Your health care provider may check your:  Height and weight. These may be used to calculate your BMI (body mass index). BMI is a measurement that tells if you are at a healthy weight.  Heart rate and blood pressure.  Body temperature.  Skin for abnormal spots. Counseling Your health care provider may ask you questions about your:  Past medical problems.  Family's medical history.  Alcohol, tobacco, and drug use.  Emotional well-being.  Home life and relationship well-being.  Sexual activity.  Diet, exercise, and sleep habits.  Work and work environment.  Access to firearms.  Method of birth control.  Menstrual cycle.  Pregnancy history. What immunizations do I need? Vaccines are usually given at various ages, according to a schedule. Your health care provider will recommend vaccines for you based on your age, medical history, and lifestyle or other factors, such as travel or where you work.   What tests do I need? Blood tests  Lipid and cholesterol levels. These may be checked every 5 years starting at age 20.  Hepatitis C test.  Hepatitis B test. Screening  Diabetes screening. This is done by checking your blood sugar (glucose) after you have not eaten for a while (fasting).  STD (sexually transmitted disease) testing, if you are at risk.  BRCA-related cancer screening. This may be  done if you have a family history of breast, ovarian, tubal, or peritoneal cancers.  Pelvic exam and Pap test. This may be done every 3 years starting at age 21. Starting at age 30, this may be done every 5 years if you have a Pap test in combination with an HPV test. Talk with your health care provider about your test results, treatment options, and if necessary, the need for more tests.   Follow these instructions at home: Eating and drinking  Eat a healthy diet that includes fresh fruits and vegetables, whole grains, lean protein, and low-fat dairy products.  Take vitamin and mineral supplements as recommended by your health care provider.  Do not drink alcohol if: ? Your health care provider tells you not to drink. ? You are pregnant, may be pregnant, or are planning to become pregnant.  If you drink alcohol: ? Limit how much you have to 0-1 drink a day. ? Be aware of how much alcohol is in your drink. In the [place] one drink equals one 12 oz bottle of beer (355 mL), one 5 oz glass of wine (148 mL), or one 1 oz glass of hard liquor (44 mL).   Lifestyle  Take daily care of your teeth and gums. Brush your teeth every morning and night with fluoride toothpaste. Floss one time each day.  Stay active. Exercise for at least 30 minutes 5 or more days each week.  Do not use any products that contain nicotine or tobacco, such as cigarettes, e-cigarettes, and chewing tobacco. If you need help quitting, ask your health care provider.  Do not   use drugs.  If you are sexually active, practice safe sex. Use a condom or other form of protection to prevent STIs (sexually transmitted infections).  If you do not wish to become pregnant, use a form of birth control. If you plan to become pregnant, see your health care provider for a prepregnancy visit.  Find healthy ways to cope with stress, such as: ? Meditation, yoga, or listening to music. ? Journaling. ? Talking to a trusted  person. ? Spending time with friends and family. Safety  Always wear your seat belt while driving or riding in a vehicle.  Do not drive: ? If you have been drinking alcohol. Do not ride with someone who has been drinking. ? When you are tired or distracted. ? While texting.  Wear a helmet and other protective equipment during sports activities.  If you have firearms in your house, make sure you follow all gun safety procedures.  Seek help if you have been physically or sexually abused. What's next?  Go to your health care provider once a year for an annual wellness visit.  Ask your health care provider how often you should have your eyes and teeth checked.  Stay up to date on all vaccines. This information is not intended to replace advice given to you by your health care provider. Make sure you discuss any questions you have with your health care provider. Document Revised: 08/10/2020 Document Reviewed: 08/24/2018 Elsevier Patient Education  2021 Elsevier Inc.  

## 2021-04-29 NOTE — Progress Notes (Signed)
GYNECOLOGY ANNUAL PREVENTATIVE CARE ENCOUNTER NOTE  History:     Shannon [name] is a 30 y.o. [phone] female here for a routine annual gynecologic exam.  Current complaints: difficulty losing weight.   Denies abnormal vaginal bleeding, discharge, pelvic pain, problems with intercourse or other gynecologic concerns.     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 Contraception: OCP (estrogen/progesterone) Last Pap: 07/2017. Results were: normal  Last mammogram: n/a   Obstetric History OB History  Gravida Para Term Preterm AB Living  3 2 2  0 1 2  SAB IAB Ectopic Multiple Live Births  1 0 0 0 2    # Outcome Date GA Lbr Len/2nd Weight Sex Delivery Anes PTL Lv  3 Term 07/26/19 [redacted]w[redacted]d / 00:33 8 lb 3.2 oz (3.72 kg) M Vag-Spont EPI  LIV  2 SAB 2019          1 Term 05/21/16 110w5d 302:24 / 01:01 7 lb 14.6 oz (3.59 kg) F Vag-Spont EPI  LIV    Past Medical History:  Diagnosis Date  . Anxiety   . Depression   . Migraines   . Post partum depression     Past Surgical History:  Procedure Laterality Date  . CHOLECYSTECTOMY    . FOOT SURGERY Left   . KNEE SURGERY Left     Current Outpatient Medications on File Prior to Visit  Medication Sig Dispense Refill  . ALAYCEN 1/35 tablet TAKE 1 TABLET BY MOUTH EVERY DAY 28 tablet 11   No current facility-administered medications on file prior to visit.    Allergies  Allergen Reactions  . Robitussin (Alcohol Free) [Guaifenesin] Itching    Took Benadryl, helped    Social History:  reports that she has never smoked. She has never used smokeless tobacco. She reports that she does not drink alcohol and does not use drugs.  Family History  Problem Relation Age of Onset  . Migraines Mother   . Cancer Maternal Grandmother   . Cancer Maternal Grandfather   .  Hypertension Paternal Grandfather   . Diabetes Maternal Aunt   . Diabetes Maternal Uncle   . Diabetes Maternal Aunt     The following portions of the patient's history were reviewed and updated as appropriate: allergies, current medications, past family history, past medical history, past social history, past surgical history and problem list.  Review of Systems Pertinent items noted in HPI and remainder of comprehensive ROS otherwise negative.  Physical Exam:  BP 113/76   Pulse 81   Resp 16   Ht 5\' 3"  (1.6 m)   Wt 214 lb 6.4 oz (97.3 kg)   LMP 04/24/2021 (Exact Date)   BMI 37.98 kg/m  CONSTITUTIONAL: Well-developed, well-nourished female in no acute distress.  HENT:  Normocephalic, atraumatic, External right and left ear normal. Oropharynx is clear and moist EYES: Conjunctivae and EOM are normal. Pupils are equal, round, and reactive to light. No scleral icterus.  NECK: Normal range of motion, supple, no masses.  Normal thyroid.  SKIN: Skin is warm and dry. No rash noted. Not diaphoretic. No erythema. No pallor. MUSCULOSKELETAL: Normal range of motion. No tenderness.  No cyanosis, clubbing, or edema.  2+ distal pulses. NEUROLOGIC: Alert and oriented to person, place, and time. Normal reflexes, muscle tone coordination.  PSYCHIATRIC: Normal mood and affect. Normal behavior. Normal judgment and thought content. CARDIOVASCULAR: Normal heart rate noted,  regular rhythm RESPIRATORY: Clear to auscultation bilaterally. Effort and breath sounds normal, no problems with respiration noted. BREASTS: Symmetric in size. No masses, tenderness, skin changes, nipple drainage, or lymphadenopathy bilaterally.  ABDOMEN: Soft, no distention noted.  No tenderness, rebound or guarding.  PELVIC: Normal appearing external genitalia and urethral meatus; normal appearing vaginal mucosa and cervix.  No abnormal discharge noted.  Pap smear obtained.  Normal uterine size, no other palpable masses, no uterine or  adnexal tenderness.  .   Assessment and Plan:    1. Women's annual routine gynecological examination . Pap:Will follow up results of pap smear and manage accordingly Mammogram : n/a  Labs: TSH, Hem A1c, Lipid, CMP Refills: OCP  Referral: none Routine preventative health maintenance measures emphasized. Please refer to After Visit Summary for other counseling recommendations.      Doreene Burke, CNM [facility],  [facility]

## 2021-04-30 LAB — THYROID PANEL WITH TSH
Free Thyroxine Index: 2.5 (ref 1.2–4.9)
T3 Uptake Ratio: 26 % (ref 24–39)
T4, Total: 9.7 ug/dL (ref 4.5–12.0)
TSH: 0.97 u[IU]/mL (ref 0.450–4.500)

## 2021-04-30 LAB — COMPREHENSIVE METABOLIC PANEL
ALT: 32 IU/L (ref 0–32)
AST: 27 IU/L (ref 0–40)
Albumin/Globulin Ratio: 1.5 (ref 1.2–2.2)
Albumin: 4.4 g/dL (ref 3.9–5.0)
Alkaline Phosphatase: 143 IU/L — ABNORMAL HIGH (ref 44–121)
BUN/Creatinine Ratio: 13 (ref 9–23)
BUN: 9 mg/dL (ref 6–20)
Bilirubin Total: 0.5 mg/dL (ref 0.0–1.2)
CO2: 24 mmol/L (ref 20–29)
Calcium: 9.5 mg/dL (ref 8.7–10.2)
Chloride: 99 mmol/L (ref 96–106)
Creatinine, Ser: 0.72 mg/dL (ref 0.57–1.00)
Globulin, Total: 2.9 g/dL (ref 1.5–4.5)
Glucose: 93 mg/dL (ref 65–99)
Potassium: 4.2 mmol/L (ref 3.5–5.2)
Sodium: 137 mmol/L (ref 134–144)
Total Protein: 7.3 g/dL (ref 6.0–8.5)
eGFR: 116 mL/min/{1.73_m2} (ref >59)

## 2021-04-30 LAB — LIPID PANEL
Chol/HDL Ratio: 3.3 ratio (ref 0.0–4.4)
Cholesterol, Total: 181 mg/dL (ref 100–199)
HDL: 55 mg/dL (ref >39)
LDL Chol Calc (NIH): 100 mg/dL — ABNORMAL HIGH (ref 0–99)
Triglycerides: 147 mg/dL (ref 0–149)
VLDL Cholesterol Cal: 26 mg/dL (ref 5–40)

## 2021-04-30 LAB — HEMOGLOBIN A1C
Est. average glucose Bld gHb Est-mCnc: 120 mg/dL
Hgb A1c MFr Bld: 5.8 % — ABNORMAL HIGH (ref 4.8–5.6)

## 2021-04-30 LAB — HEPATITIS C ANTIBODY: Hep C Virus Ab: <0.1 s/co ratio (ref 0.0–0.9)

## 2021-05-01 ENCOUNTER — Telehealth: Payer: Self-pay | Admitting: Certified Nurse Midwife

## 2021-05-01 NOTE — Telephone Encounter (Signed)
New Message:  Pt states that she was suppose to get a RX sent for Zoloft to her pharmacy.  She has checked and they have yet to receive it.  Pt uses [facility] in [place] on [address]

## 2021-05-01 NOTE — Telephone Encounter (Signed)
Pt aware no documentation about zoloft or phq9 in her chart from the last visit. Pt states AT told her she was going to rx.   Pt aware I am unable to give due to no documentation. AT will address upon her return on 5/10. Pt very understanding.

## 2021-05-05 ENCOUNTER — Other Ambulatory Visit: Payer: Self-pay | Admitting: Certified Nurse Midwife

## 2021-05-06 ENCOUNTER — Other Ambulatory Visit: Payer: Self-pay

## 2021-05-06 ENCOUNTER — Other Ambulatory Visit: Payer: Self-pay | Admitting: Certified Nurse Midwife

## 2021-05-06 ENCOUNTER — Encounter: Payer: Self-pay | Admitting: Certified Nurse Midwife

## 2021-05-06 ENCOUNTER — Ambulatory Visit (INDEPENDENT_AMBULATORY_CARE_PROVIDER_SITE_OTHER): Payer: Managed Care, Other (non HMO) | Admitting: Certified Nurse Midwife

## 2021-05-06 VITALS — BP 113/78 | HR 77 | Resp 16 | Ht 63.0 in | Wt 220.0 lb

## 2021-05-06 DIAGNOSIS — Z7689 Persons encountering health services in other specified circumstances: Secondary | ICD-10-CM | POA: Diagnosis not present

## 2021-05-06 DIAGNOSIS — R7303 Prediabetes: Secondary | ICD-10-CM

## 2021-05-06 DIAGNOSIS — E669 Obesity, unspecified: Secondary | ICD-10-CM | POA: Diagnosis not present

## 2021-05-06 LAB — CYTOLOGY - PAP
Diagnosis: NEGATIVE
Diagnosis: REACTIVE

## 2021-05-06 MED ORDER — CYANOCOBALAMIN 1000 MCG/ML IJ SOLN: 1000.0000 ug | Freq: Once | INTRAMUSCULAR | 5 refills | Status: DC

## 2021-05-06 MED ORDER — SERTRALINE HCL 50 MG PO TABS: 75.0000 mg | ORAL_TABLET | Freq: Every day | ORAL | 5 refills | Status: DC

## 2021-05-06 MED ORDER — CYANOCOBALAMIN 1000 MCG/ML IJ SOLN
1000.0000 ug | Freq: Once | INTRAMUSCULAR | Status: AC
  Administered 2021-05-06: 1000 ug via INTRAMUSCULAR

## 2021-05-06 MED ORDER — PHENTERMINE HCL 37.5 MG PO TABS: 37.5000 mg | ORAL_TABLET | Freq: Every day | ORAL | 0 refills | Status: DC

## 2021-05-06 NOTE — Addendum Note (Signed)
Addended by: Tommie Raymond on: 05/06/2021 11:29 AM   Modules accepted: Orders

## 2021-05-06 NOTE — Progress Notes (Signed)
Subjective:  Shannon [name] is a 30 y.o. [id number] at Unknown being seen today for weight loss management- initial visit.  Patient reports General ROS: negative and reports previous weight loss attempts: Management changes made at the last visit include:  a stopping medication, diet and exercise  Onset was gradual,  Year ago.  Onset followed:  Birth control,  change in work environment, recent pregnancy, and mental(anxiety and derepression) status changes. Associated symptoms include: fatigue, depression, anxiety,  abdominal pain due to gi upset with poor diet,  change in clothing fit and menstrual changes. Current treatment includes: small frequent feedings, gluten free diet, , antidepressant, vitamin B-12 injections and appetite stimulant.  Pertinent medical history includes: chronic digestive disease, pre-diabetes,  anxiety and depression.  Risk factors include: none  The patient has a surgical history of: none Pertinent social history includes: none  Past evaluation has included: metabolic profile, hemoglobin A1c, thyroid panel.  The following portions of the patient's history were reviewed and updated as appropriate: allergies, current medications, past family history, past medical history, past social history, past surgical history and problem list.   Objective:   Vitals:   05/06/21 1041  BP: 113/78  Pulse: 77  Resp: 16  Weight: 220 lb (99.8 kg)  Height: 5\' 3"  (1.6 m)    General:  Alert, oriented and cooperative. Patient is in no acute distress.  PE: Well groomed female in no current distress,   Mental Status: Normal mood and affect. Normal behavior. Normal judgment and thought content.   Current BMI: Body mass index is 38.97 kg/m.   Assessment and Plan:  Obesity  1. Encounter for weight management  Patient completed/signed controlled substance agreement form. Information given on use and possible side effects of medications.   Plan: low carb, High protein diet RX for  adipex 37.5 mg daily and B12 .ml monthly, to start now with first injection given at today's visit. Reviewed side-effects common to both medications and expected outcomes. Increase daily water intake to at least 8 bottle a day, every day.  Goal is to reduse weight by 5% (11 lbs) by end of three months, and will re-evaluate then.   2. Anxiety and depression, pt re starting zoloft. State she is not sleeping well due to anxiety and worried about her kids well being. Stress at work over whelming.   GAD 7 : Generalized Anxiety Score 05/06/2021 10/09/2019 09/07/2019  Nervous, Anxious, on Edge 2 3 3   Control/stop worrying 3 3 3   Worry too much - different things 3 3 3   Trouble relaxing 2 1 3   Restless 3 3 3   Easily annoyed or irritable 3 3 3   Afraid - awful might happen 3 1 3   Total GAD 7 Score 19 17 21   Anxiety Difficulty Very difficult Very difficult Extremely difficult    PHQ9 SCORE ONLY 05/06/2021 04/29/2021 11/06/2019  PHQ-9 Total Score 0 1 6     RTC in 4 weeks for Nurse visit to check weight & BP, and get next B12 injections.    Please refer to After Visit Summary for other counseling recommendations.   PMP aware website: [place], [place], 77F Refine Search Contact the [facility] Knowledge/Help Center Date of Birth: 1991/04/11 Recent Address: [address] [place], 07/06/2021 06/29/2021 View Linked Records (1) Other Tools/Metrics [facility] Report generated on 05/06/2021. Report Date Range: 05/07/2019 - 05/06/2021 PDF Report Export Narx Scores Narcotic 070 Sedative 030 Stimulant 000 Explanation and Guidance Overdose Risk Score 130 (Range 000-999) Explanation and  Guidance State Indicators (0)   Doreene Burke, CNM      Consider the Low Glycemic Index Diet and 6 smaller meals daily .  This boosts your metabolism and regulates your sugars:   Use the protein bar by [employer]kins because they have lots of fiber in them  Find the low carb flatbreads, tortillas  and pita breads for sandwiches:  Joseph's makes a pita bread and a flat bread , available at [facility] and [employer]; Toufayah makes a low carb flatbread available at [employer] and HT that is 9 net carbs and 100 cal [place] makes a low carb whole wheat tortilla available at [employer] most grocery stores with 6 net carbs and 210 cal  [place] yogurt can still have a lot of carbs .  Dannon Light N fit has 80 cal and 8 carbs

## 2021-05-06 NOTE — Patient Instructions (Signed)

## 2021-05-29 ENCOUNTER — Other Ambulatory Visit: Payer: Self-pay | Admitting: Certified Nurse Midwife

## 2021-06-08 ENCOUNTER — Other Ambulatory Visit: Payer: Self-pay

## 2021-06-08 ENCOUNTER — Encounter: Payer: Self-pay | Admitting: Certified Nurse Midwife

## 2021-06-08 ENCOUNTER — Ambulatory Visit: Payer: Managed Care, Other (non HMO) | Admitting: Certified Nurse Midwife

## 2021-06-08 VITALS — BP 109/75 | HR 73 | Ht 63.0 in | Wt 205.2 lb

## 2021-06-08 DIAGNOSIS — E669 Obesity, unspecified: Secondary | ICD-10-CM

## 2021-06-08 DIAGNOSIS — Z7689 Persons encountering health services in other specified circumstances: Secondary | ICD-10-CM

## 2021-06-08 MED ORDER — CYANOCOBALAMIN 1000 MCG/ML IJ SOLN
1000.0000 ug | Freq: Once | INTRAMUSCULAR | Status: AC
Start: 2021-06-08 — End: 2021-06-08
  Administered 2021-06-08: 1000 ug via INTRAMUSCULAR

## 2021-06-08 MED ORDER — PHENTERMINE HCL 37.5 MG PO TABS
37.5000 mg | ORAL_TABLET | Freq: Every day | ORAL | 0 refills | Status: DC
Start: 2021-06-08 — End: 2021-07-06

## 2021-06-08 NOTE — Patient Instructions (Signed)
Exercising to Lose Weight Exercise is structured, repetitive physical activity to improve fitness and health. Getting regular exercise is important for everyone. It is especially important if you are overweight. Being overweight increases your risk of heart disease, stroke, diabetes, high blood pressure, and several types of cancer.Reducing your calorie intake and exercising can help you lose weight. Exercise is usually categorized as moderate or vigorous intensity. To lose weight, most people need to do a certain amount of moderate-intensity orvigorous-intensity exercise each week. Moderate-intensity exercise  Moderate-intensity exercise is any activity that gets you moving enough to burn at least three times more energy (calories) than if you were sitting. Examples of moderate exercise include: Walking a mile in 15 minutes. Doing light yard work. Biking at an easy pace. Most people should get at least 150 minutes (2 hours and 30 minutes) a week ofmoderate-intensity exercise to maintain their body weight. Vigorous-intensity exercise Vigorous-intensity exercise is any activity that gets you moving enough to burn at least six times more calories than if you were sitting. When you exercise at this intensity, you should be working hard enough that you are not able tocarry on a conversation. Examples of vigorous exercise include: Running. Playing a team sport, such as football, basketball, and soccer. Jumping rope. Most people should get at least 75 minutes (1 hour and 15 minutes) a week ofvigorous-intensity exercise to maintain their body weight. How can exercise affect me? When you exercise enough to burn more calories than you eat, you lose weight. Exercise also reduces body fat and builds muscle. The more muscle you have, the more calories you burn. Exercise also: Improves mood. Reduces stress and tension. Improves your overall fitness, flexibility, and endurance. Increases bone strength. The  amount of exercise you need to lose weight depends on: Your age. The type of exercise. Any health conditions you have. Your overall physical ability. Talk to your health care provider about how much exercise you need and whattypes of activities are safe for you. What actions can I take to lose weight? Nutrition  Make changes to your diet as told by your health care provider or diet and nutrition specialist (dietitian). This may include: Eating fewer calories. Eating more protein. Eating less unhealthy fats. Eating a diet that includes fresh fruits and vegetables, whole grains, low-fat dairy products, and lean protein. Avoiding foods with added fat, salt, and sugar. Drink plenty of water while you exercise to prevent dehydration or heat stroke.  Activity Choose an activity that you enjoy and set realistic goals. Your health care provider can help you make an exercise plan that works for you. Exercise at a moderate or vigorous intensity most days of the week. The intensity of exercise may vary from person to person. You can tell how intense a workout is for you by paying attention to your breathing and heartbeat. Most people will notice their breathing and heartbeat get faster with more intense exercise. Do resistance training twice each week, such as: Push-ups. Sit-ups. Lifting weights. Using resistance bands. Getting short amounts of exercise can be just as helpful as long structured periods of exercise. If you have trouble finding time to exercise, try to include exercise in your daily routine. Get up, stretch, and walk around every 30 minutes throughout the day. Go for a walk during your lunch break. Park your car farther away from your destination. If you take public transportation, get off one stop early and walk the rest of the way. Make phone calls while standing up and   walking around. Take the stairs instead of elevators or escalators. Wear comfortable clothes and shoes with  good support. Do not exercise so much that you hurt yourself, feel dizzy, or get very short of breath. Where to find more information [place] Department of Health and Human Services: www.hhs.gov Centers for Disease Control and Prevention (CDC): www.cdc.gov Contact a health care provider: Before starting a new exercise program. If you have questions or concerns about your weight. If you have a medical problem that keeps you from exercising. Get help right away if you have any of the following while exercising: Injury. Dizziness. Difficulty breathing or shortness of breath that does not go away when you stop exercising. Chest pain. Rapid heartbeat. Summary Being overweight increases your risk of heart disease, stroke, diabetes, high blood pressure, and several types of cancer. Losing weight happens when you burn more calories than you eat. Reducing the amount of calories you eat in addition to getting regular moderate or vigorous exercise each week helps you lose weight. This information is not intended to replace advice given to you by your health care provider. Make sure you discuss any questions you have with your healthcare provider. Document Revised: 03/24/2020 Document Reviewed: 04/10/2020 Elsevier Patient Education  2022 [employer]  

## 2021-06-08 NOTE — Progress Notes (Signed)
SUBJECTIVE:  30 y.o. here for follow-up weight loss visit, previously seen 4 weeks ago. Denies any concerns and feels like medication is working well. Initally felt very jittery with medication but has adjusted to it now. Denies any SOB or chest pain. She state she has cut back on alcohol and is cut out soda, she has increased her walking daily trying to meet number of steps daily.   OBJECTIVE:  BP 109/75   Pulse 73   Ht 5\' 3"  (1.6 m)   Wt 205 lb 3.2 oz (93.1 kg)   LMP 05/23/2021   BMI 36.35 kg/m   Body mass index is 36.35 kg/m. Waist:  Patient appears well. ASSESSMENT:  Obesity- responding well to weight loss plan PLAN:  To continue with current medications. B12 1020mcg/ml injection given RTC in 4 weeks as planned  80m CNM

## 2021-07-01 ENCOUNTER — Ambulatory Visit: Payer: Managed Care, Other (non HMO) | Admitting: Dietician

## 2021-07-06 ENCOUNTER — Encounter: Payer: Self-pay | Admitting: Certified Nurse Midwife

## 2021-07-06 ENCOUNTER — Other Ambulatory Visit: Payer: Self-pay

## 2021-07-06 ENCOUNTER — Ambulatory Visit: Payer: Managed Care, Other (non HMO) | Admitting: Certified Nurse Midwife

## 2021-07-06 VITALS — BP 116/80 | HR 98 | Ht 63.0 in | Wt 196.5 lb

## 2021-07-06 DIAGNOSIS — Z7689 Persons encountering health services in other specified circumstances: Secondary | ICD-10-CM

## 2021-07-06 MED ORDER — CYANOCOBALAMIN 1000 MCG/ML IJ SOLN
1000.0000 ug | Freq: Once | INTRAMUSCULAR | Status: AC
  Administered 2021-07-06: 1000 ug via INTRAMUSCULAR

## 2021-07-06 MED ORDER — PHENTERMINE HCL 37.5 MG PO TABS
37.5000 mg | ORAL_TABLET | Freq: Every day | ORAL | 0 refills | Status: DC
Start: 2021-07-06 — End: 2021-08-05

## 2021-07-06 NOTE — Progress Notes (Signed)
SUBJECTIVE:  30 y.o. here for follow-up weight loss visit, previously seen 4 weeks ago. Denies any concerns and feels like medication is working well. She lost 9 lbs. Since her last visit. States that she is been focusing on her eating more than exercise. Has noticed that since she had changed her diet she feels bad when eating poorly. She is walking daily 30-45 min. She walks at work q 2 hrs 10-15 min.   OBJECTIVE:  BP 116/80   Pulse 98   Ht 5\' 3"  (1.6 m)   Wt 196 lb 8 oz (89.1 kg)   BMI 34.81 kg/m   Body mass index is 34.81 kg/m. Patient appears well. States her goal is 160 lbs.  ASSESSMENT:  Obesity- responding well to weight loss plan PLAN:  To continue with current medications. B12 1022mcg/ml injection given RTC in 4 weeks as planned  80m, CNM

## 2021-07-06 NOTE — Patient Instructions (Signed)

## 2021-08-05 ENCOUNTER — Encounter: Payer: Self-pay | Admitting: Certified Nurse Midwife

## 2021-08-05 ENCOUNTER — Ambulatory Visit: Payer: Managed Care, Other (non HMO) | Admitting: Certified Nurse Midwife

## 2021-08-05 ENCOUNTER — Other Ambulatory Visit: Payer: Self-pay

## 2021-08-05 VITALS — BP 116/77 | HR 86 | Ht 63.0 in | Wt 197.0 lb

## 2021-08-05 DIAGNOSIS — Z6834 Body mass index (BMI) 34.0-34.9, adult: Secondary | ICD-10-CM | POA: Diagnosis not present

## 2021-08-05 DIAGNOSIS — E6609 Other obesity due to excess calories: Secondary | ICD-10-CM | POA: Diagnosis not present

## 2021-08-05 DIAGNOSIS — Z7689 Persons encountering health services in other specified circumstances: Secondary | ICD-10-CM

## 2021-08-05 MED ORDER — PHENTERMINE HCL 37.5 MG PO TABS
37.5000 mg | ORAL_TABLET | Freq: Every day | ORAL | 0 refills | Status: DC
Start: 2021-08-05 — End: 2021-09-15

## 2021-08-05 MED ORDER — CYANOCOBALAMIN 1000 MCG/ML IJ SOLN
1000.0000 ug | Freq: Once | INTRAMUSCULAR | Status: AC
  Administered 2021-08-05: 1000 ug via INTRAMUSCULAR

## 2021-08-05 NOTE — Patient Instructions (Signed)

## 2021-08-05 NOTE — Progress Notes (Signed)
SUBJECTIVE:  30 y.o. here for follow-up weight loss visit, previously seen 4 weeks ago. Denies any concerns and feels like medication is working well. She did gain 1 lb this month. States the only thing she has done different is added a day of exercise. She states she has been eating smaller more frequent meals. She has appointment with nutritionist at the end of this month. Recommend that she keep food journal to take with her so they can guide her on her diet. She verbalizes and agrees.  OBJECTIVE:  BP 116/77   Pulse 86   Ht 5\' 3"  (1.6 m)   Wt 197 lb (89.4 kg)   BMI 34.90 kg/m   Body mass index is 34.9 kg/m. Waist 40 in. Patient appears well. ASSESSMENT:  Obesity- responding well to weight loss plan PLAN:  To continue with current medications. B12 1045mcg/ml injection given RTC in 4 weeks as planned  80m, CNM

## 2021-08-20 ENCOUNTER — Encounter: Payer: Self-pay | Admitting: Dietician

## 2021-08-20 ENCOUNTER — Other Ambulatory Visit: Payer: Self-pay

## 2021-08-20 ENCOUNTER — Encounter: Payer: Managed Care, Other (non HMO) | Attending: Certified Nurse Midwife | Admitting: Dietician

## 2021-08-20 VITALS — Ht 63.0 in | Wt 197.9 lb

## 2021-08-20 DIAGNOSIS — Z6835 Body mass index (BMI) 35.0-35.9, adult: Secondary | ICD-10-CM | POA: Diagnosis not present

## 2021-08-20 DIAGNOSIS — E669 Obesity, unspecified: Secondary | ICD-10-CM | POA: Diagnosis not present

## 2021-08-20 DIAGNOSIS — E66812 Obesity, class 2: Secondary | ICD-10-CM

## 2021-08-20 DIAGNOSIS — R7303 Prediabetes: Secondary | ICD-10-CM | POA: Diagnosis not present

## 2021-08-20 NOTE — Patient Instructions (Signed)
Continue with regular exercise, great job so far! Keep alcohol intake low- moderate; ideally no more than 1-2 drinks at a time. General goal is 1 drink at a time for women.  Aim for 1200 calories daily at a minimum; 1400-1500 calories is likely a healthy level for gradual weight loss when you are incorporating regular exercise. Plan to have something to eat every 3-5 hours during the day; if not hungry for a snack, ok to skip it unless you get overly hungry before the next meal. Or try a smaller snack like a fruit without peanut butter, or 1 egg, etc.

## 2021-08-20 NOTE — Progress Notes (Signed)
Medical Nutrition Therapy: Visit start time: 1330  end time: 1430  Assessment:  Diagnosis: overweight Past medical history: pre-diabetes Psychosocial issues/ stress concerns: anxiety  Preferred learning method:  Auditory Hands-on   Current weight: 197.9lbs Height: 5'3" BMI: 35.06 Medications, supplements: reconciled list in medical record  Progress and evaluation:  Reports stable weight over at least the past month. Has increased gym workouts  Reports allergy/ intolerance to dairy -- GI upset with milk, ice cream, and rash/ hives with cheese dip; bloating/ constipation with gluten; acid reflux with citrus or spicy foods  Has been using MyFitnessPal to track intake, ranging 1200-1600kcal daily Taking phentermine which seems to be reducing appetite, feels full sooner when eating meals, often not hungry for snacks.  Physical activity: HIIT 30 minutes 3x a week  Dietary Intake:  Usual eating pattern includes 3 meals and 3 snacks per day. Dining out frequency: 4-5 meals per week.  Breakfast: coffee; herbalife shake, herbalife tea with aloe Snack: belvita biscuit (urgent BM after); 3 boiled eggs, pistachios Lunch: thin steak, small portion rice; grilled chicken and pinto beans Snack: banana and peanut butter; protein bar; apple with peanut butter, protein bar Supper: steak, frozen hash browns Snack: none Beverages: water, coffee, herbalife tea with aloe  Nutrition Care Education: Topics covered:  Basic nutrition: basic food groups, appropriate nutrient balance, appropriate meal and snack schedule, general nutrition guidelines    Weight control: importance of low sugar and low fat choices, portion control, estimated energy needs for weight loss at 1400 kcal, provided guidance for 40-45% CHO, 25-30% pro, 30% fat -- discussed healthy ranges for macronutrients; non food factors that can affect weight; role of physical activity and effects on body composition  Other: brief discussion of  fodmap diet relating to food intolerances reported by pateint  Nutritional Diagnosis:  [place]-3.3 Overweight/obesity As related to history of excess calories and inadequate physical activity.  As evidenced by patient with current BMI of 35, working on diet and lifestyle changes to promote weight loss.  Intervention:  Instruction and discussion as noted above. Patient has been making healthy lifestyle changes and is motivated to continue.  Established goals for additional change with direction from patient.   Education Materials given:  Designer, industrial/product with food lists Fodmap diet overview Visit summary with goals/ instructions   Learner/ who was taught:  Patient   Level of understanding: Verbalizes/ demonstrates competency   Demonstrated degree of understanding via:   Teach back Learning barriers: None   Willingness to learn/ readiness for change: Eager, change in progress   Monitoring and Evaluation:  Dietary intake, exercise, and body weight      follow up:  09/30/21 at 8:15am

## 2021-09-02 ENCOUNTER — Encounter: Payer: Managed Care, Other (non HMO) | Admitting: Certified Nurse Midwife

## 2021-09-15 ENCOUNTER — Encounter: Payer: Self-pay | Admitting: Certified Nurse Midwife

## 2021-09-15 ENCOUNTER — Other Ambulatory Visit: Payer: Self-pay

## 2021-09-15 ENCOUNTER — Ambulatory Visit: Payer: Managed Care, Other (non HMO) | Admitting: Certified Nurse Midwife

## 2021-09-15 VITALS — BP 125/91 | HR 73 | Ht 63.0 in | Wt 190.3 lb

## 2021-09-15 DIAGNOSIS — Z7689 Persons encountering health services in other specified circumstances: Secondary | ICD-10-CM | POA: Diagnosis not present

## 2021-09-15 MED ORDER — PHENTERMINE HCL 37.5 MG PO TABS
37.5000 mg | ORAL_TABLET | Freq: Every day | ORAL | 0 refills | Status: DC
Start: 2021-09-15 — End: 2021-10-26

## 2021-09-15 MED ORDER — CYANOCOBALAMIN 1000 MCG/ML IJ SOLN
1000.0000 ug | Freq: Once | INTRAMUSCULAR | Status: AC
  Administered 2021-09-15: 1000 ug via INTRAMUSCULAR

## 2021-09-15 NOTE — Patient Instructions (Signed)

## 2021-09-15 NOTE — Progress Notes (Signed)
SUBJECTIVE:  30 y.o. here for follow-up weight loss visit, previously seen 4 weeks ago. Denies any concerns and feels like medication is helping with appetite suppression. She lost 7 Lbs this past month. She went to nutritionist and did not feel like it was helpful. States that the dietician went over what she already knew and did not address her specific questions/issues.   OBJECTIVE:  BP (!) 125/91   Pulse 73   Ht 5\' 3"  (1.6 m)   Wt 190 lb 4.8 oz (86.3 kg)   BMI 33.71 kg/m   Body mass index is 33.71 kg/m.  Waist: 38.5  RPT BP 118/85  Patient appears well. ASSESSMENT:  Obesity- responding well to weight loss plan PLAN:  To continue with current medications. B12 1078mcg/ml injection given RTC in 4 weeks as planned  80m, CNM

## 2021-09-30 ENCOUNTER — Ambulatory Visit: Payer: Managed Care, Other (non HMO) | Admitting: Dietician

## 2021-10-13 ENCOUNTER — Encounter: Payer: Self-pay | Admitting: Certified Nurse Midwife

## 2021-10-13 ENCOUNTER — Encounter: Payer: Managed Care, Other (non HMO) | Admitting: Certified Nurse Midwife

## 2021-10-13 DIAGNOSIS — Z7689 Persons encountering health services in other specified circumstances: Secondary | ICD-10-CM

## 2021-10-19 ENCOUNTER — Encounter: Payer: Self-pay | Admitting: Dietician

## 2021-10-19 NOTE — Progress Notes (Signed)
Patient did not wish to reschedule her cancelled appointment from 09/30/21. Sent notification to referring provider.

## 2021-10-26 ENCOUNTER — Encounter: Payer: Self-pay | Admitting: Certified Nurse Midwife

## 2021-10-26 ENCOUNTER — Other Ambulatory Visit: Payer: Self-pay

## 2021-10-26 ENCOUNTER — Ambulatory Visit (INDEPENDENT_AMBULATORY_CARE_PROVIDER_SITE_OTHER): Payer: Managed Care, Other (non HMO) | Admitting: Certified Nurse Midwife

## 2021-10-26 VITALS — BP 116/82 | HR 92 | Ht 63.0 in | Wt 187.8 lb

## 2021-10-26 DIAGNOSIS — Z7689 Persons encountering health services in other specified circumstances: Secondary | ICD-10-CM | POA: Diagnosis not present

## 2021-10-26 MED ORDER — CYANOCOBALAMIN 1000 MCG/ML IJ SOLN
1000.0000 ug | Freq: Once | INTRAMUSCULAR | Status: AC
  Administered 2021-10-26: 1000 ug via INTRAMUSCULAR

## 2021-10-26 MED ORDER — PHENTERMINE HCL 37.5 MG PO TABS
37.5000 mg | ORAL_TABLET | Freq: Every day | ORAL | 0 refills | Status: DC
Start: 2021-10-26 — End: 2021-12-25

## 2021-10-26 NOTE — Patient Instructions (Signed)

## 2021-10-26 NOTE — Progress Notes (Signed)
SUBJECTIVE:  30 y.o. here for follow-up weight loss visit, previously seen 4 weeks ago. Denies any concerns and feels like medication is helping with appetite control She has lost 3 lbs this past month. She denies any issues with medication use.   OBJECTIVE:  BP 116/82   Pulse 92   Ht 5\' 3"  (1.6 m)   Wt 187 lb 12.8 oz (85.2 kg)   BMI 33.27 kg/m   Body mass index is 33.27 kg/m. Waist: 37   Patient appears well. ASSESSMENT:  Obesity- responding well to weight loss plan PLAN:  To continue with current medications. B12 1068mcg/ml injection given RTC in 4 weeks as planned  80m, CNM

## 2021-11-18 ENCOUNTER — Other Ambulatory Visit: Payer: Self-pay | Admitting: Certified Nurse Midwife

## 2021-11-24 ENCOUNTER — Other Ambulatory Visit: Payer: Self-pay

## 2021-11-24 ENCOUNTER — Encounter: Payer: Self-pay | Admitting: Certified Nurse Midwife

## 2021-11-24 ENCOUNTER — Ambulatory Visit: Payer: Managed Care, Other (non HMO) | Admitting: Certified Nurse Midwife

## 2021-11-24 VITALS — BP 120/80 | HR 93 | Ht 63.0 in | Wt 197.7 lb

## 2021-11-24 DIAGNOSIS — Z3200 Encounter for pregnancy test, result unknown: Secondary | ICD-10-CM | POA: Diagnosis not present

## 2021-11-24 LAB — POCT URINE PREGNANCY: Preg Test, Ur: POSITIVE — AB

## 2021-11-24 NOTE — Patient Instructions (Signed)

## 2021-11-24 NOTE — Progress Notes (Signed)
Subjective:    Shannon [name] is a 30 y.o. female who presents for evaluation of amenorrhea. She believes she could be pregnant. Pregnancy is desired. Sexual Activity: single partner, contraception: none. Current symptoms also include: fatigue and weight gain . Last period was normal.   Patient's last menstrual period was 10/09/2021 (exact date). The following portions of the patient's history were reviewed and updated as appropriate: allergies, current medications, past family history, past medical history, past social history, past surgical history, and problem list.  Review of Systems Pertinent items are noted in HPI.     Objective:    BP 120/80   Pulse 93   Ht 5\' 3"  (1.6 m)   Wt 197 lb 11.2 oz (89.7 kg)   LMP 10/09/2021 (Exact Date)   BMI 35.02 kg/m  General: appears stated age and no acute distress    Lab Review Urine HCG: positive    Assessment:    Absence of menstruation.     Plan:    Pregnancy Test:  Positive: EDC: 07/16/22. Briefly discussed pre-natal care options. Midwifery and MD care reviewed.. Encouraged well-balanced diet, plenty of rest when needed, pre-natal vitamins daily and walking for exercise. Discussed self-help for nausea, avoiding OTC medications until consulting provider or pharmacist, other than Tylenol as needed, minimal caffeine (1-2 cups daily) and avoiding alcohol. She will schedule dating u/s 2 wks , nurse visit in 4 wks, and her initial NOB visit with me in 6 wks . Feel free to call with any questions.   07/18/22, CNM

## 2021-12-08 ENCOUNTER — Other Ambulatory Visit: Payer: Self-pay | Admitting: Certified Nurse Midwife

## 2021-12-08 ENCOUNTER — Ambulatory Visit (INDEPENDENT_AMBULATORY_CARE_PROVIDER_SITE_OTHER): Payer: Managed Care, Other (non HMO)

## 2021-12-08 ENCOUNTER — Other Ambulatory Visit: Payer: Self-pay

## 2021-12-08 DIAGNOSIS — Z3200 Encounter for pregnancy test, result unknown: Secondary | ICD-10-CM | POA: Diagnosis not present

## 2021-12-25 ENCOUNTER — Other Ambulatory Visit: Payer: Self-pay

## 2021-12-25 ENCOUNTER — Ambulatory Visit (INDEPENDENT_AMBULATORY_CARE_PROVIDER_SITE_OTHER): Payer: Managed Care, Other (non HMO) | Admitting: Certified Nurse Midwife

## 2021-12-25 VITALS — BP 114/71 | HR 74 | Resp 16 | Ht 63.0 in | Wt 202.5 lb

## 2021-12-25 DIAGNOSIS — Z3481 Encounter for supervision of other normal pregnancy, first trimester: Secondary | ICD-10-CM

## 2021-12-25 DIAGNOSIS — Z3A11 11 weeks gestation of pregnancy: Secondary | ICD-10-CM

## 2021-12-25 DIAGNOSIS — Z0283 Encounter for blood-alcohol and blood-drug test: Secondary | ICD-10-CM | POA: Diagnosis not present

## 2021-12-25 DIAGNOSIS — O99210 Obesity complicating pregnancy, unspecified trimester: Secondary | ICD-10-CM | POA: Diagnosis not present

## 2021-12-25 DIAGNOSIS — Z113 Encounter for screening for infections with a predominantly sexual mode of transmission: Secondary | ICD-10-CM

## 2021-12-25 NOTE — Patient Instructions (Signed)
First Trimester of Pregnancy °The first trimester of pregnancy starts on the first day of your last menstrual period until the end of week 12. This is also called months 1 through 3 of pregnancy. °Body changes during your first trimester °Your body goes through many changes during pregnancy. The changes usually return to normal after your baby is born. °Physical changes °You may gain or lose weight. °Your breasts may grow larger and hurt. The area around your nipples may get darker. °Dark spots or blotches may develop on your face. °You may have changes in your hair. °Health changes °You may feel like you might vomit (nauseous), and you may vomit. °You may have heartburn. °You may have headaches. °You may have trouble pooping (constipation). °Your gums may bleed. °Other changes °You may get tired easily. °You may pee (urinate) more often. °Your menstrual periods will stop. °You may not feel hungry. °You may want to eat certain kinds of food. °You may have changes in your emotions from day to day. °You may have more dreams. °Follow these instructions at home: °Medicines °Take over-the-counter and prescription medicines only as told by your doctor. Some medicines are not safe during pregnancy. °Take a prenatal vitamin that contains at least 600 micrograms (mcg) of folic acid. °Eating and drinking °Eat healthy meals that include: °Fresh fruits and vegetables. °Whole grains. °Good sources of protein, such as meat, eggs, or tofu. °Low-fat dairy products. °Avoid raw meat and unpasteurized juice, milk, and cheese. °If you feel like you may vomit, or you vomit: °Eat 4 or 5 small meals a day instead of 3 large meals. °Try eating a few soda crackers. °Drink liquids between meals instead of during meals. °You may need to take these actions to prevent or treat trouble pooping: °Drink enough fluids to keep your pee (urine) pale yellow. °Eat foods that are high in fiber. These include beans, whole grains, and fresh fruits and  vegetables. °Limit foods that are high in fat and sugar. These include fried or sweet foods. °Activity °Exercise only as told by your doctor. Most people can do their usual exercise routine during pregnancy. °Stop exercising if you have cramps or pain in your lower belly (abdomen) or low back. °Do not exercise if it is too hot or too humid, or if you are in a place of great height (high altitude). °Avoid heavy lifting. °If you choose to, you may have sex unless your doctor tells you not to. °Relieving pain and discomfort °Wear a good support bra if your breasts are sore. °Rest with your legs raised (elevated) if you have leg cramps or low back pain. °If you have bulging veins (varicose veins) in your legs: °Wear support hose as told by your doctor. °Raise your feet for 15 minutes, 3-4 times a day. °Limit salt in your food. °Safety °Wear your seat belt at all times when you are in a car. °Talk with your doctor if someone is hurting you or yelling at you. °Talk with your doctor if you are feeling sad or have thoughts of hurting yourself. °Lifestyle °Do not use hot tubs, steam rooms, or saunas. °Do not douche. Do not use tampons or scented sanitary pads. °Do not use herbal medicines, illegal drugs, or medicines that are not approved by your doctor. Do not drink alcohol. °Do not smoke or use any products that contain nicotine or tobacco. If you need help quitting, ask your doctor. °Avoid cat litter boxes and soil that is used by cats. These carry germs   that can cause harm to the baby and can cause a loss of your baby by miscarriage or stillbirth. °General instructions °Keep all follow-up visits. This is important. °Ask for help if you need counseling or if you need help with nutrition. Your doctor can give you advice or tell you where to go for help. °Visit your dentist. At home, brush your teeth with a soft toothbrush. Floss gently. °Write down your questions. Take them to your prenatal visits. °Where to find more  information °American Pregnancy Association: americanpregnancy.org °American College of Obstetricians and Gynecologists: www.acog.org °Office on Women's Health: womenshealth.gov/pregnancy °Contact a doctor if: °You are dizzy. °You have a fever. °You have mild cramps or pressure in your lower belly. °You have a nagging pain in your belly area. °You continue to feel like you may vomit, you vomit, or you have watery poop (diarrhea) for 24 hours or longer. °You have a bad-smelling fluid coming from your vagina. °You have pain when you pee. °You are exposed to a disease that spreads from person to person, such as chickenpox, measles, Zika virus, HIV, or hepatitis. °Get help right away if: °You have spotting or bleeding from your vagina. °You have very bad belly cramping or pain. °You have shortness of breath or chest pain. °You have any kind of injury, such as from a fall or a car crash. °You have new or increased pain, swelling, or redness in an arm or leg. °Summary °The first trimester of pregnancy starts on the first day of your last menstrual period until the end of week 12 (months 1 through 3). °Eat 4 or 5 small meals a day instead of 3 large meals. °Do not smoke or use any products that contain nicotine or tobacco. If you need help quitting, ask your doctor. °Keep all follow-up visits. °This information is not intended to replace advice given to you by your health care provider. Make sure you discuss any questions you have with your health care provider. °Document Revised: 05/21/2020 Document Reviewed: 03/27/2020 °Elsevier Patient Education © 2022 [employer] °Commonly Asked Questions During Pregnancy ° °Cats: A parasite can be excreted in cat feces.  To avoid exposure you need to have another person empty the little box.  If you must empty the litter box you will need to wear gloves.  Wash your hands after handling your cat.  This parasite can also be found in raw or undercooked meat so this should also be  avoided. ° °Colds, Sore Throats, Flu: Please check your medication sheet to see what you can take for symptoms.  If your symptoms are unrelieved by these medications please call the office. ° °Dental Work: Most any dental work your dentist recommends is permitted.  X-rays should only be taken during the first trimester if absolutely necessary.  Your abdomen should be shielded with a lead apron during all x-rays.  Please notify your provider prior to receiving any x-rays.  Novocaine is fine; gas is not recommended.  If your dentist requires a note from us prior to dental work please call the office and we will provide one for you. ° °Exercise: Exercise is an important part of staying healthy during your pregnancy.  You may continue most exercises you were accustomed to prior to pregnancy.  Later in your pregnancy you will most likely notice you have difficulty with activities requiring balance like riding a bicycle.  It is important that you listen to your body and avoid activities that put you at a higher risk   of falling.  Adequate rest and staying well hydrated are a must!  If you have questions about the safety of specific activities ask your provider.   ° °Exposure to Children with illness: Try to avoid obvious exposure; report any symptoms to us when noted,  If you have chicken pos, red measles or mumps, you should be immune to these diseases.   Please do not take any vaccines while pregnant unless you have checked with your OB provider. ° °Fetal Movement: After 28 weeks we recommend you do "kick counts" twice daily.  Lie or sit down in a calm quiet environment and count your baby movements "kicks".  You should feel your baby at least 10 times per hour.  If you have not felt 10 kicks within the first hour get up, walk around and have something sweet to eat or drink then repeat for an additional hour.  If count remains less than 10 per hour notify your provider. ° °Fumigating: Follow your pest control agent's  advice as to how long to stay out of your home.  Ventilate the area well before re-entering. ° °Hemorrhoids:   Most over-the-counter preparations can be used during pregnancy.  Check your medication to see what is safe to use.  It is important to use a stool softener or fiber in your diet and to drink lots of liquids.  If hemorrhoids seem to be getting worse please call the office.  ° °Hot Tubs:  Hot tubs Jacuzzis and saunas are not recommended while pregnant.  These increase your internal body temperature and should be avoided. ° °Intercourse:  Sexual intercourse is safe during pregnancy as long as you are comfortable, unless otherwise advised by your provider.  Spotting may occur after intercourse; report any bright red bleeding that is heavier than spotting. ° °Labor:  If you know that you are in labor, please go to the hospital.  If you are unsure, please call the office and let us help you decide what to do. ° °Lifting, straining, etc:  If your job requires heavy lifting or straining please check with your provider for any limitations.  Generally, you should not lift items heavier than that you can lift simply with your hands and arms (no back muscles) ° °Painting:  Paint fumes do not harm your pregnancy, but may make you ill and should be avoided if possible.  Latex or water based paints have less odor than oils.  Use adequate ventilation while painting. ° °Permanents & Hair Color:  Chemicals in hair dyes are not recommended as they cause increase hair dryness which can increase hair loss during pregnancy.  " Highlighting" and permanents are allowed.  Dye may be absorbed differently and permanents may not hold as well during pregnancy. ° °Sunbathing:  Use a sunscreen, as skin burns easily during pregnancy.  Drink plenty of fluids; avoid over heating. ° °Tanning Beds:  Because their possible side effects are still unknown, tanning beds are not recommended. ° °Ultrasound Scans:  Routine ultrasounds are performed  at approximately 20 weeks.  You will be able to see your baby's general anatomy an if you would like to know the gender this can usually be determined as well.  If it is questionable when you conceived you may also receive an ultrasound early in your pregnancy for dating purposes.  Otherwise ultrasound exams are not routinely performed unless there is a medical necessity.  Although you can request a scan we ask that you pay for it when conducted   because insurance does not cover " patient request" scans. ° °Work: If your pregnancy proceeds without complications you may work until your due date, unless your physician or employer advises otherwise. ° °Round Ligament Pain/Pelvic Discomfort:  Sharp, shooting pains not associated with bleeding are fairly common, usually occurring in the second trimester of pregnancy.  They tend to be worse when standing up or when you remain standing for long periods of time.  These are the result of pressure of certain pelvic ligaments called "round ligaments".  Rest, Tylenol and heat seem to be the most effective relief.  As the womb and fetus grow, they rise out of the pelvis and the discomfort improves.  Please notify the office if your pain seems different than that described.  It may represent a more serious condition. ° °Morning Sickness °Morning sickness is when you feel like you may vomit (feel nauseous) during pregnancy. Sometimes, you may vomit. Morning sickness most often happens in the morning, but it can also happen at any time of the day. Some women may have morning sickness that makes them vomit all the time. This is a more serious problem that needs treatment. °What are the causes? °The cause of this condition is not known. °What increases the risk? °You had vomiting or a feeling like you may vomit before your pregnancy. °You had morning sickness in another pregnancy. °You are pregnant with more than one baby, such as twins. °What are the signs or symptoms? °Feeling like  you may vomit. °Vomiting. °How is this treated? °Treatment is usually not needed for this condition. You may only need to change what you eat. In some cases, your doctor may give you some things to take for your condition. These include: °Vitamin B6 supplements. °Medicines to treat the feeling that you may vomit. °Ginger. °Follow these instructions at home: °Medicines °Take over-the-counter and prescription medicines only as told by your doctor. Do not take any medicines until you talk with your doctor about them first. °Take multivitamins before you get pregnant. These can stop or lessen the symptoms of morning sickness. °Eating and drinking °Eat dry toast or crackers before getting out of bed. °Eat 5 or 6 small meals a day. °Eat dry and bland foods like rice and baked potatoes. °Do not eat greasy, fatty, or spicy foods. °Have someone cook for you if the smell of food causes you to vomit or to feel like you may vomit. °If you feel like you may vomit after taking prenatal vitamins, take them at night or with a snack. °Eat protein foods when you need a snack. Nuts, yogurt, and cheese are good choices. °Drink fluids throughout the day. °Try ginger ale made with real ginger, ginger tea made from fresh grated ginger, or ginger candies. °General instructions °Do not smoke or use any products that contain nicotine or tobacco. If you need help quitting, ask your doctor. °Use an air purifier to keep the air in your house free of smells. °Get lots of fresh air. °Try to avoid smells that make you feel sick. °Try wearing an acupressure wristband. This is a wristband that is used to treat seasickness. °Try a treatment called acupuncture. In this treatment, a doctor puts needles into certain areas of your body to make you feel better. °Contact a doctor if: °You need medicine to feel better. °You feel dizzy or light-headed. °You are losing weight. °Get help right away if: °The feeling that you may vomit will not go away, or you  cannot   stop vomiting. °You faint. °You have very bad pain in your belly. °Summary °Morning sickness is when you feel like you may vomit (feel nauseous) during pregnancy. °You may feel sick in the morning, but you can feel this way at any time of the day. °Making some changes to what you eat may help your symptoms go away. °This information is not intended to replace advice given to you by your health care provider. Make sure you discuss any questions you have with your health care provider. °Document Revised: 07/28/2020 Document Reviewed: 07/07/2020 °Elsevier Patient Education © 2022 Elsevier Inc. ° °

## 2021-12-25 NOTE — Progress Notes (Incomplete)
Binnie Rail Medinger presents for NOB nurse interview visit. Pregnancy confirmation done 11/24/2021.  [id number]. Pregnancy education material explained and given. No cats in the home. NOB labs ordered. TSH/HbgA1c due to Increased BMI. Body mass index is 35.87 kg/m. HIV labs and Drug screen were explained optional and she did decline. Drug screen declined. PNV encouraged. Genetic screening options discussed. Genetic testing: Ordered.  Pt may discuss with provider.  Financial policy reviewed. FMLA form reviewed and signed. Pt. To follow up with [name] in 2 weeks for NOB physical.  All questions answered. 2

## 2021-12-26 LAB — URINALYSIS, ROUTINE W REFLEX MICROSCOPIC
Bilirubin, UA: NEGATIVE
Glucose, UA: NEGATIVE
Ketones, UA: NEGATIVE
Leukocytes,UA: NEGATIVE
Nitrite, UA: NEGATIVE
Protein,UA: NEGATIVE
RBC, UA: NEGATIVE
Specific Gravity, UA: 1.018 (ref 1.005–1.030)
Urobilinogen, Ur: 0.2 mg/dL (ref 0.2–1.0)
pH, UA: 6 (ref 5.0–7.5)

## 2021-12-27 LAB — CULTURE, OB URINE

## 2021-12-27 LAB — GC/CHLAMYDIA PROBE AMP
Chlamydia trachomatis, NAA: NEGATIVE
Neisseria Gonorrhoeae by PCR: NEGATIVE

## 2021-12-27 LAB — URINE CULTURE, OB REFLEX

## 2021-12-27 NOTE — L&D Delivery Note (Signed)
Delivery Note   [name] is a 31 y.o. [id number] at [redacted]w[redacted]d Estimated Date of Delivery: 07/27/22  PRE-OPERATIVE DIAGNOSIS:  1) [redacted]w[redacted]d pregnancy.  GBS positive  POST-OPERATIVE DIAGNOSIS:  1) [redacted]w[redacted]d pregnancy s/p Vaginal, Spontaneous  Adequate GBS prophylaxis  Delivery Type: Vaginal, Spontaneous    Delivery Anesthesia: Epidural   Labor Complications:  None    ESTIMATED BLOOD LOSS: 400  ml    FINDINGS:   1) female infant, "[name]," Apgar scores of 8   at 1 minute and 9   at 5 minutes and a birthweight of 9 lbs., 12.6 ounces.     SPECIMENS:   PLACENTA:   Appearance: Intact    Removal: Spontaneous      Disposition:    CORD BLOOD: Collected/Not Indicated  DISPOSITION:  Infant left in stable condition in the delivery room, with L&D personnel and mother.  NARRATIVE SUMMARY: Labor course:  Shannon [name] is a [id number] at [redacted]w[redacted]d who presented to Labor & Delivery for induction of labor. Her initial cervical exam was 2/50/-3. Labor proceeded with augmentation, and she was found to be completely dilated at 1804. With excellent maternal pushing effort, she birthed a viable female infant "[name]" at [facility]. The shoulders were birthed with strong maternal effort and gentle traction. The infant was placed skin-to-skin with Paula Compton. The cord was doubly clamped and cut when pulsations ceased. The placenta delivered spontaneously and was noted to be intact with a 3VC. A perineal and vaginal examination was performed. Episiotomy/Lacerations: None   The patient tolerated this well. Mother and baby were left in stable condition.   [name] Spanish, CNM 07/23/2022 9:14 PM

## 2021-12-30 LAB — VIRAL HEPATITIS HBV, HCV
HCV Ab: 0.2 s/co ratio (ref 0.0–0.9)
Hep B Core Total Ab: NEGATIVE
Hep B Surface Ab, Qual: REACTIVE
Hepatitis B Surface Ag: NEGATIVE

## 2021-12-30 LAB — RPR: RPR Ser Ql: NONREACTIVE

## 2021-12-30 LAB — HCV INTERPRETATION

## 2021-12-30 LAB — ABO AND RH: Rh Factor: POSITIVE

## 2021-12-30 LAB — VARICELLA ZOSTER ANTIBODY, IGG: Varicella zoster IgG: 620 index (ref >165)

## 2021-12-30 LAB — ANTIBODY SCREEN: Antibody Screen: NEGATIVE

## 2021-12-30 LAB — RUBELLA SCREEN: Rubella Antibodies, IGG: 5.04 index (ref >0.99)

## 2021-12-30 LAB — PARVOVIRUS B19 ANTIBODY, IGG AND IGM
Parvovirus B19 IgG: 7.3 index — ABNORMAL HIGH (ref 0.0–0.8)
Parvovirus B19 IgM: 0.3 index (ref 0.0–0.8)

## 2021-12-30 LAB — HEMOGLOBIN A1C
Est. average glucose Bld gHb Est-mCnc: 111 mg/dL
Hgb A1c MFr Bld: 5.5 % (ref 4.8–5.6)

## 2021-12-30 LAB — TSH: TSH: 1.37 u[IU]/mL (ref 0.450–4.500)

## 2022-01-01 ENCOUNTER — Telehealth: Payer: Self-pay | Admitting: Certified Nurse Midwife

## 2022-01-01 NOTE — Telephone Encounter (Signed)
Pt is calling in stating that she was lying down and her son was on the bed with her and he started jumping up and down and he landed on her stomach.  Pt stated after that happened she is now having cramping and dull pain underneath her belly button and when she gets up to walk she is cramping a lot.  Pt would like to have a call back to let her know what she need to do before her appointment on Tuesday 01/05/2022 w/Annie she is aware that we only have 1 provider in the office today.

## 2022-01-04 NOTE — Progress Notes (Signed)
Shannon [name] presents for NOB nurse interview visit. Pregnancy confirmation done 11/24/2021.  [id number]. Pregnancy education material explained and given. No cats in the home. NOB labs ordered. TSH/HbgA1c due to Increased BMI. Body mass index is 35.87 kg/m. HIV labs and Drug screen were explained optional and she did decline. Drug screen declined. PNV encouraged. Genetic screening options discussed. Genetic testing: Ordered.  Pt may discuss with provider.  Financial policy reviewed. FMLA form reviewed and signed. Pt. To follow up with [name] in 2 weeks for NOB physical.  All questions answered.

## 2022-01-05 ENCOUNTER — Ambulatory Visit (INDEPENDENT_AMBULATORY_CARE_PROVIDER_SITE_OTHER): Payer: Managed Care, Other (non HMO) | Admitting: Certified Nurse Midwife

## 2022-01-05 ENCOUNTER — Other Ambulatory Visit: Payer: Self-pay

## 2022-01-05 ENCOUNTER — Encounter: Payer: Self-pay | Admitting: Certified Nurse Midwife

## 2022-01-05 VITALS — BP 114/79 | HR 84 | Wt 201.9 lb

## 2022-01-05 DIAGNOSIS — Z1379 Encounter for other screening for genetic and chromosomal anomalies: Secondary | ICD-10-CM

## 2022-01-05 DIAGNOSIS — Z3A12 12 weeks gestation of pregnancy: Secondary | ICD-10-CM

## 2022-01-05 DIAGNOSIS — Z3481 Encounter for supervision of other normal pregnancy, first trimester: Secondary | ICD-10-CM

## 2022-01-05 LAB — POCT URINALYSIS DIPSTICK OB
Bilirubin, UA: NEGATIVE
Glucose, UA: NEGATIVE
Ketones, UA: NEGATIVE
Leukocytes, UA: NEGATIVE
Nitrite, UA: NEGATIVE
POC,PROTEIN,UA: NEGATIVE
Spec Grav, UA: 1.015 (ref 1.010–1.025)
Urobilinogen, UA: 0.2 E.U./dL
pH, UA: 6.5 (ref 5.0–8.0)

## 2022-01-05 MED ORDER — ASPIRIN EC 81 MG PO TBEC: 81.0000 mg | DELAYED_RELEASE_TABLET | Freq: Every day | ORAL | 11 refills | Status: DC

## 2022-01-05 NOTE — Telephone Encounter (Signed)
Pt was seen in office today for NOB PE. Pt had u/s following visit to confirm fetal heartbeat which was 169. All pt questions answered.

## 2022-01-05 NOTE — Patient Instructions (Signed)
Prenatal Care ?Prenatal care is health care during pregnancy. It helps you and your unborn baby (fetus) stay as healthy as possible. Prenatal care may be provided by a midwife, a family practice doctor, a mid-level practitioner (nurse practitioner or physician assistant), or a childbirth and pregnancy doctor (obstetrician). ?How does this affect me? ?During pregnancy, you will be closely monitored for any new conditions that might develop. To lower your risk of pregnancy complications, you and your health care provider will talk about any underlying conditions you have. ?How does this affect my baby? ?Early and consistent prenatal care increases the chance that your baby will be healthy during pregnancy. Prenatal care lowers the risk that your baby will be: ?Born early (prematurely). ?Smaller than expected at birth (small for gestational age). ?What can I expect at the first prenatal care visit? ?Your first prenatal care visit will likely be the longest. You should schedule your first prenatal care visit as soon as you know that you are pregnant. Your first visit is a good time to talk about any questions or concerns you have about pregnancy. ?Medical history ?At your visit, you and your health care provider will talk about your medical history, including: ?Any past pregnancies. ?Your family's medical history. ?Medical history of the baby's father. ?Any long-term (chronic) health conditions you have and how you manage them. ?Any surgeries or procedures you have had. ?Any current over-the-counter or prescription medicines, herbs, or supplements that you are taking. ?Other factors that could pose a risk to your baby, including: ?Exposure to harmful chemicals or radiation at work or at home. ?Any substance use, including tobacco, alcohol, and drug use. ?Your home setting and your stress levels, including: ?Exposure to abuse or violence. ?Household financial strain. ?Your daily health habits, including diet and  exercise. ?Tests and screenings ?Your health care provider will: ?Measure your weight, height, and blood pressure. ?Do a physical exam, including a pelvic and breast exam. ?Perform blood tests and urine tests to check for: ?Urinary tract infection. ?Sexually transmitted infections (STIs). ?Low iron levels in your blood (anemia). ?Blood type and certain proteins on red blood cells (Rh antibodies). ?Infections and immunity to viruses, such as hepatitis B and rubella. ?HIV (human immunodeficiency virus). ?Discuss your options for genetic screening. ?Tips about staying healthy ?Your health care provider will also give you information about how to keep yourself and your baby healthy, including: ?Nutrition and taking vitamins. ?Physical activity. ?How to manage pregnancy symptoms such as nausea and vomiting (morning sickness). ?Infections and substances that may be harmful to your baby and how to avoid them. ?Food safety. ?Dental care. ?Working. ?Travel. ?Warning signs to watch for and when to call your health care provider. ?How often will I have prenatal care visits? ?After your first prenatal care visit, you will have regular visits throughout your pregnancy. The visit schedule is often as follows: ?Up to week 28 of pregnancy: once every 4 weeks. ?28-36 weeks: once every 2 weeks. ?After 36 weeks: every week until delivery. ?Some women may have visits more or less often depending on any underlying health conditions and the health of the baby. ?Keep all follow-up and prenatal care visits. This is important. ?What happens during routine prenatal care visits? ?Your health care provider will: ?Measure your weight and blood pressure. ?Check for fetal heart sounds. ?Measure the height of your uterus in your abdomen (fundal height). This may be measured starting around week 20 of pregnancy. ?Check the position of your baby inside your uterus. ?Ask questions   about your diet, sleeping patterns, and whether you can feel the baby  move. ?Review warning signs to watch for and signs of labor. ?Ask about any pregnancy symptoms you are having and how you are dealing with them. Symptoms may include: ?Headaches. ?Nausea and vomiting. ?Vaginal discharge. ?Swelling. ?Fatigue. ?Constipation. ?Changes in your vision. ?Feeling persistently sad or anxious. ?Any discomfort, including back or pelvic pain. ?Bleeding or spotting. ?Make a list of questions to ask your health care provider at your routine visits. ?What tests might I have during prenatal care visits? ?You may have blood, urine, and imaging tests throughout your pregnancy, such as: ?Urine tests to check for glucose, protein, or signs of infection. ?Glucose tests to check for a form of diabetes that can develop during pregnancy (gestational diabetes mellitus). This is usually done around week 24 of pregnancy. ?Ultrasounds to check your baby's growth and development, to check for birth defects, and to check your baby's well-being. These can also help to decide when you should deliver your baby. ?A test to check for group B strep (GBS) infection. This is usually done around week 36 of pregnancy. ?Genetic testing. This may include blood, fluid, or tissue sampling, or imaging tests, such as an ultrasound. Some genetic tests are done during the first trimester and some are done during the second trimester. ?What else can I expect during prenatal care visits? ?Your health care provider may recommend getting certain vaccines during pregnancy. These may include: ?A yearly flu shot (annual influenza vaccine). This is especially important if you will be pregnant during flu season. ?Tdap (tetanus, diphtheria, pertussis) vaccine. Getting this vaccine during pregnancy can protect your baby from whooping cough (pertussis) after birth. This vaccine may be recommended between weeks 27 and 36 of pregnancy. ?A COVID-19 vaccine. ?Later in your pregnancy, your health care provider may give you information  about: ?Childbirth and breastfeeding classes. ?Choosing a health care provider for your baby. ?Umbilical cord banking. ?Breastfeeding. ?Birth control after your baby is born. ?The hospital labor and delivery unit and how to set up a tour. ?Registering at the hospital before you go into labor. ?Where to find more information ?Office on Women's Health: womenshealth.gov ?American Pregnancy Association: americanpregnancy.org ?March of Dimes: marchofdimes.org ?Summary ?Prenatal care helps you and your baby stay as healthy as possible during pregnancy. ?Your first prenatal care visit will most likely be the longest. ?You will have visits and tests throughout your pregnancy to monitor your health and your baby's health. ?Bring a list of questions to your visits to ask your health care provider. ?Make sure to keep all follow-up and prenatal care visits. ?This information is not intended to replace advice given to you by your health care provider. Make sure you discuss any questions you have with your health care provider. ?Document Revised: 09/25/2020 Document Reviewed: 09/25/2020 ?Elsevier Patient Education ? 2022 Elsevier Inc. ? ?

## 2022-01-05 NOTE — Progress Notes (Signed)
NEW OB HISTORY AND PHYSICAL  SUBJECTIVE:       Shannon [name] is a 31 y.o. [phone] female, Patient's last menstrual period was 10/09/2021 (exact date)., Estimated Date of Delivery: 07/27/22, 11 weeks , presents today for establishment of Prenatal Care. She has no unusual complaints. States that her toddler accidentally jumped on her last week, She experienced some mild cramping that resolved. She denies bleeding.   Body mass index is 35.76 kg/m.   Relationship: married Living with spouse and children Work: Medical illustrator job  Exercise: none currently Smoking/alcohol/drug use: denies  Gynecologic History Patient's last menstrual period was 10/09/2021 (exact date). Normal Contraception: none Last Pap: 04/29/2021. Results were: normal  Obstetric History OB History  Gravida Para Term Preterm AB Living  4 2 2  0 1 2  SAB IAB Ectopic Multiple Live Births  1 0 0 0 2    # Outcome Date GA Lbr Len/2nd Weight Sex Delivery Anes PTL Lv  4 Current           3 Term 07/26/19 [redacted]w[redacted]d / 00:33 8 lb 3.2 oz (3.72 kg) M Vag-Spont EPI  LIV  2 SAB 2019          1 Term 05/21/16 [redacted]w[redacted]d 302:24 / 01:01 7 lb 14.6 oz (3.59 kg) F Vag-Spont EPI  LIV    Past Medical History:  Diagnosis Date   Anxiety    Depression    Migraines    Post partum depression     Past Surgical History:  Procedure Laterality Date   CHOLECYSTECTOMY     FOOT SURGERY Left    KNEE SURGERY Left     Current Outpatient Medications on File Prior to Visit  Medication Sig Dispense Refill   Prenatal Vit-Fe Fumarate-FA (MULTIVITAMIN-PRENATAL) 27-0.8 MG TABS tablet Take 1 tablet by mouth daily at 12 noon.     No current facility-administered medications on file prior to visit.    Allergies  Allergen Reactions   Robitussin (Alcohol Free) [Guaifenesin] Itching    Took Benadryl, helped    Social History   Socioeconomic History   Marital status: Married    Spouse name: Darinda [place]   Number of children: Not on file    Years of education: Not on file   Highest education level: Not on file  Occupational History   Occupation: [employer]  Tobacco Use   Smoking status: Never   Smokeless tobacco: Never  Vaping Use   Vaping Use: Never used  Substance and Sexual Activity   Alcohol use: Not Currently    Alcohol/week: 4.0 standard drinks    Types: 4 Standard drinks or equivalent per week   Drug use: No   Sexual activity: Yes    Birth control/protection: Other-see comments    Comment: Still deciding  Other Topics Concern   Not on file  Social History Narrative   Not on file   Social Determinants of Health   Financial Resource Strain: Not on file  Food Insecurity: Not on file  Transportation Needs: Not on file  Physical Activity: Not on file  Stress: Not on file  Social Connections: Not on file  Intimate Partner Violence: Not on file    Family History  Problem Relation Age of Onset   Migraines Mother    Cancer Maternal Grandmother    Cancer Maternal Grandfather    Hypertension Paternal Grandfather    Diabetes Maternal Aunt    Diabetes Maternal Uncle    Diabetes Maternal Aunt     The  following portions of the patient's history were reviewed and updated as appropriate: allergies, current medications, past OB history, past medical history, past surgical history, past family history, past social history, and problem list.    OBJECTIVE: Initial Physical Exam (New OB)  GENERAL APPEARANCE: alert, well appearing, in no apparent distress, oriented to person, place and time, overweight HEAD: normocephalic, atraumatic MOUTH: mucous membranes moist, pharynx normal without lesions THYROID: no thyromegaly or masses present BREASTS: no masses noted, no significant tenderness, no palpable axillary nodes, no skin changes LUNGS: clear to auscultation, no wheezes, rales or rhonchi, symmetric air entry HEART: regular rate and rhythm, no murmurs ABDOMEN: soft, nontender, nondistended, no abnormal  masses, no epigastric pain, obese, and FHT not heard EXTREMITIES: no redness or tenderness in the calves or thighs, no edema, no limitation in range of motion, intact peripheral pulses SKIN: normal coloration and turgor, no rashes LYMPH NODES: no adenopathy palpable NEUROLOGIC: alert, oriented, normal speech, no focal findings or movement disorder noted  PELVIC EXAM deferred, pap not due, tested pelvis  ASSESSMENT: Normal pregnancy  Unable to get FHT, u/s confirmed HR 169.  PLAN: Prenatal care See orders New OB counseling: The patient has been given an overview regarding routine prenatal care. Recommendations regarding diet, weight gain, and exercise in pregnancy were given. Prenatal testing, optional genetic testing, carrier screening, and ultrasound use in pregnancy were reviewed. Will do panorama next visit. Benefits of Breast Feeding were discussed. The patient is  encouraged to consider nursing her baby post partum. Discussed Asprin use 81 mg daily 14-16 weeks due to elevated BMI. She is in agreement.   Follow up 4 wks with Missy.   Doreene Burke, CNM

## 2022-01-05 NOTE — Progress Notes (Signed)
Error ent

## 2022-01-06 ENCOUNTER — Other Ambulatory Visit: Payer: Managed Care, Other (non HMO)

## 2022-01-06 DIAGNOSIS — Z1379 Encounter for other screening for genetic and chromosomal anomalies: Secondary | ICD-10-CM

## 2022-01-11 LAB — MATERNIT21 PLUS CORE+SCA
Fetal Fraction: 8
Monosomy X (Turner Syndrome): NOT DETECTED
Result (T21): NEGATIVE
Trisomy 13 (Patau syndrome): NEGATIVE
Trisomy 18 (Edwards syndrome): NEGATIVE
Trisomy 21 (Down syndrome): NEGATIVE
XXX (Triple X Syndrome): NOT DETECTED
XXY (Klinefelter Syndrome): NOT DETECTED
XYY (Jacobs Syndrome): NOT DETECTED

## 2022-02-03 ENCOUNTER — Ambulatory Visit (INDEPENDENT_AMBULATORY_CARE_PROVIDER_SITE_OTHER): Payer: Managed Care, Other (non HMO) | Admitting: Obstetrics

## 2022-02-03 ENCOUNTER — Other Ambulatory Visit: Payer: Self-pay

## 2022-02-03 VITALS — BP 102/70 | Wt 203.0 lb

## 2022-02-03 DIAGNOSIS — Z3482 Encounter for supervision of other normal pregnancy, second trimester: Secondary | ICD-10-CM

## 2022-02-03 NOTE — Progress Notes (Signed)
ROB at [redacted]w[redacted]d. Not sleeping well d/t anxiety and nighttime headaches. Stopped sertraline in first trimester; discussed restarting if desired, can use a lower dose if she prefers (was on 75, could start at 20). Reviewed comfort measures for headaches and sleep. CBC today. Anatomy US ordered. RTC in 4 weeks.  Lloyd Huger, CNM

## 2022-02-03 NOTE — Progress Notes (Signed)
Pt c/o having a lot of migraines recently, mainly at night. Had migraines w/ 1st pregnancy. No vb. No lof.

## 2022-02-04 LAB — CBC WITH DIFFERENTIAL/PLATELET
Basophils Absolute: 0 10*3/uL (ref 0.0–0.2)
Basos: 0 %
EOS (ABSOLUTE): 0 10*3/uL (ref 0.0–0.4)
Eos: 1 %
Hematocrit: 38.6 % (ref 34.0–46.6)
Hemoglobin: 13.1 g/dL (ref 11.1–15.9)
Immature Grans (Abs): 0 10*3/uL (ref 0.0–0.1)
Immature Granulocytes: 0 %
Lymphocytes Absolute: 2.3 10*3/uL (ref 0.7–3.1)
Lymphs: 28 %
MCH: 29.4 pg (ref 26.6–33.0)
MCHC: 33.9 g/dL (ref 31.5–35.7)
MCV: 87 fL (ref 79–97)
Monocytes Absolute: 0.5 10*3/uL (ref 0.1–0.9)
Monocytes: 6 %
Neutrophils Absolute: 5.3 10*3/uL (ref 1.4–7.0)
Neutrophils: 65 %
Platelets: 381 10*3/uL (ref 150–450)
RBC: 4.46 x10E6/uL (ref 3.77–5.28)
RDW: 12 % (ref 11.7–15.4)
WBC: 8.2 10*3/uL (ref 3.4–10.8)

## 2022-03-16 ENCOUNTER — Other Ambulatory Visit: Payer: Self-pay

## 2022-03-16 ENCOUNTER — Ambulatory Visit (INDEPENDENT_AMBULATORY_CARE_PROVIDER_SITE_OTHER): Payer: Managed Care, Other (non HMO)

## 2022-03-16 ENCOUNTER — Encounter: Payer: Self-pay | Admitting: Certified Nurse Midwife

## 2022-03-16 ENCOUNTER — Ambulatory Visit (INDEPENDENT_AMBULATORY_CARE_PROVIDER_SITE_OTHER): Payer: Managed Care, Other (non HMO) | Admitting: Certified Nurse Midwife

## 2022-03-16 VITALS — BP 110/75 | HR 81 | Wt 211.1 lb

## 2022-03-16 DIAGNOSIS — Z3482 Encounter for supervision of other normal pregnancy, second trimester: Secondary | ICD-10-CM

## 2022-03-16 DIAGNOSIS — Z3A21 21 weeks gestation of pregnancy: Secondary | ICD-10-CM

## 2022-03-16 LAB — POCT URINALYSIS DIPSTICK OB
Bilirubin, UA: NEGATIVE
Blood, UA: NEGATIVE
Glucose, UA: NEGATIVE
Ketones, UA: NEGATIVE
Leukocytes, UA: NEGATIVE
Nitrite, UA: NEGATIVE
POC,PROTEIN,UA: NEGATIVE
Spec Grav, UA: 1.01 (ref 1.010–1.025)
Urobilinogen, UA: 0.2 E.U./dL
pH, UA: 6.5 (ref 5.0–8.0)

## 2022-03-16 NOTE — Patient Instructions (Signed)
Round Ligament Pain The round ligaments are a pair of cord-like tissues that help support the uterus. They can become a source of pain during pregnancy as the ligaments soften and stretch as the baby grows. The pain usually begins in the second trimester (13-28 weeks) of pregnancy, and should only last for a few seconds when it occurs. However, the pain can come and go until the baby is delivered. The pain does not cause harm to the baby. Round ligament pain is usually a short, sharp, and pinching pain, but it can also be a dull, lingering, and aching pain. The pain is felt in the lower side of the abdomen or in the groin. It usually starts deep in the groin and moves up to the outside of the hip area. The pain may happen when you: Suddenly change position, such as quickly going from a sitting to standing position. Do physical activity. Cough or sneeze. Follow these instructions at home: Managing pain  When the pain starts, relax. Then, try any of these methods to help with the pain: Sit down. Flex your knees up to your abdomen. Lie on your side with one pillow under your abdomen and another pillow between your legs. Sit in a warm bath for 15-20 minutes or until the pain goes away. General instructions Watch your condition for any changes. Move slowly when you sit down or stand up. Stop or reduce your physical activities if they cause pain. Avoid long walks if they cause pain. Take over-the-counter and prescription medicines only as told by your health care provider. Keep all follow-up visits. This is important. Contact a health care provider if: Your pain does not go away with treatment. You feel pain in your back that you did not have before. Your medicine is not helping. You have a fever or chills. You have nausea or vomiting. You have diarrhea. You have pain when you urinate. Get help right away if: You have pain that is a rhythmic, cramping pain similar to labor pains. Labor pains  are usually 2 minutes apart, last for about 1 minute, and involve a bearing down feeling or pressure in your pelvis. You have vaginal bleeding. These symptoms may represent a serious problem that is an emergency. Do not wait to see if the symptoms will go away. Get medical help right away. Call your local emergency services (911 in the [place] Do not drive yourself to the hospital. Summary Round ligament pain is felt in the lower abdomen or groin. This pain usually begins in the second trimester (13-28 weeks) and should only last for a few seconds when it occurs. You may notice the pain when you suddenly change position, when you cough or sneeze, or during physical activity. Relaxing, flexing your knees to your abdomen, lying on one side, or taking a warm bath may help to get rid of the pain. Contact your health care provider if the pain does not go away. This information is not intended to replace advice given to you by your health care provider. Make sure you discuss any questions you have with your health care provider. Document Revised: 02/25/2021 Document Reviewed: 02/25/2021 Elsevier Patient Education  2022 Elsevier Inc.  

## 2022-03-16 NOTE — Progress Notes (Signed)
ROB and anatomy u/s today. See below. Reviewed with patient and her spouse. All questions answered. Discussed merging with [facility] and that they would be taking call with use Mid April. She verbalizes understanding. Follow up 4 wk for ROB with Missy.  ? ? ?Patient Name: Shannon [name] ?DOB: 05/31/1991 ?MRN: [id number] ? ?ULTRASOUND REPORT ? ?Location: [facility] ?Date of Service: 03/16/2022  ? ?Indications:Anatomy Ultrasound ?Findings:  ?Singleton intrauterine pregnancy is visualized with FHR at 141 BPM.  ? ?Biometrics give an (U/S) Gestational age of [redacted]w[redacted]d and an (U/S) EDD of 07/24/22; this correlates with the clinically established Estimated Date of Delivery: 07/27/22  ? ?Fetal presentation is Variable.  ?EFW: 447g / 1lb. ?Placenta: posterior. Grade: 0 ?AFI: subjectively normal. ? ?Anatomic survey is complete and normal; Gender - female.   ? ?Ovaries are not visualized. ?Survey of the adnexa demonstrates no adnexal masses. ? ?There is no free peritoneal fluid in the cul de sac. ? ?Impression: ?1. [redacted]w[redacted]d Viable Singleton Intrauterine pregnancy by U/S. ?2. (U/S) EDD is consistent with Clinically established Estimated Date of Delivery: 07/27/22 . ?3. Normal Anatomy Scan ? ?Recommendations: ?1.Clinical correlation with the patient's History and Physical Exam. ? ?Sheralyn Boatman  [place] ?

## 2022-04-14 ENCOUNTER — Ambulatory Visit (INDEPENDENT_AMBULATORY_CARE_PROVIDER_SITE_OTHER): Payer: Managed Care, Other (non HMO) | Admitting: Obstetrics

## 2022-04-14 ENCOUNTER — Encounter: Payer: Managed Care, Other (non HMO) | Admitting: Obstetrics

## 2022-04-14 VITALS — BP 117/77 | HR 80 | Wt 215.0 lb

## 2022-04-14 DIAGNOSIS — Z3A25 25 weeks gestation of pregnancy: Secondary | ICD-10-CM

## 2022-04-14 DIAGNOSIS — Z3482 Encounter for supervision of other normal pregnancy, second trimester: Secondary | ICD-10-CM

## 2022-04-14 LAB — POCT URINALYSIS DIPSTICK OB
Glucose, UA: NEGATIVE
POC,PROTEIN,UA: NEGATIVE

## 2022-04-14 NOTE — Progress Notes (Signed)
ROB at [redacted]w[redacted]d. Active baby. Denies ctx, LOF, and vaginal bleeding. Having leg cramps at night; recommended magnesium qhs. Reviewed prior births. Anticipatory guidance about third trimester. Reviewed danger signs. Discussed 1-hour glucose, CBC, and RPR and next visit. RTC in 3 weeks. ? ?[name] Spanish, CNM ?

## 2022-04-27 ENCOUNTER — Telehealth: Payer: Self-pay | Admitting: Obstetrics

## 2022-04-27 NOTE — Telephone Encounter (Signed)
Patient called and stated that she started feeling light headed around 5am. Patient states that she called the nurses hotline and was advised to seek medical attention. Patient stated that she called EMS that arrived around 6am. Pt states that paramedics took her vital signs and glucose. Blood pressure was 135/72 and glucose was 110. Patient states she is currently still dizzy, light headed, nauseous and awoke from a nap with a headache. Please advise.  ?

## 2022-04-27 NOTE — Telephone Encounter (Signed)
LM with patient relaying info.  ?

## 2022-05-03 ENCOUNTER — Encounter: Payer: Managed Care, Other (non HMO) | Admitting: Certified Nurse Midwife

## 2022-05-06 ENCOUNTER — Ambulatory Visit (INDEPENDENT_AMBULATORY_CARE_PROVIDER_SITE_OTHER): Payer: Managed Care, Other (non HMO) | Admitting: Certified Nurse Midwife

## 2022-05-06 ENCOUNTER — Other Ambulatory Visit: Payer: Managed Care, Other (non HMO)

## 2022-05-06 ENCOUNTER — Encounter: Payer: Self-pay | Admitting: Certified Nurse Midwife

## 2022-05-06 VITALS — BP 113/76 | HR 87 | Wt 218.6 lb

## 2022-05-06 DIAGNOSIS — Z23 Encounter for immunization: Secondary | ICD-10-CM

## 2022-05-06 DIAGNOSIS — Z3483 Encounter for supervision of other normal pregnancy, third trimester: Secondary | ICD-10-CM | POA: Diagnosis not present

## 2022-05-06 DIAGNOSIS — Z3A28 28 weeks gestation of pregnancy: Secondary | ICD-10-CM

## 2022-05-06 MED ORDER — TETANUS-DIPHTH-ACELL PERTUSSIS 5-2.5-18.5 LF-MCG/0.5 IM SUSY
0.5000 mL | PREFILLED_SYRINGE | Freq: Once | INTRAMUSCULAR | Status: AC
  Administered 2022-05-06: 0.5 mL via INTRAMUSCULAR

## 2022-05-06 NOTE — Patient Instructions (Signed)
Td (Tetanus, Diphtheria) Vaccine: What You Need to Know 1. Why get vaccinated? Td vaccine can prevent tetanus and diphtheria. Tetanus enters the body through cuts or wounds. Diphtheria spreads from person to person. TETANUS (T) causes painful stiffening of the muscles. Tetanus can lead to serious health problems, including being unable to open the mouth, having trouble swallowing and breathing, or death. DIPHTHERIA (D) can lead to difficulty breathing, heart failure, paralysis, or death. 2. Td vaccine Td is only for children 7 years and older, adolescents, and adults.  Td is usually given as a booster dose every 10 years, or after 5 years in the case of a severe or dirty wound or burn. Another vaccine, called "Tdap," may be used instead of Td. Tdap protects against pertussis, also known as "whooping cough," in addition to tetanus and diphtheria. Td may be given at the same time as other vaccines. 3. Talk with your health care provider Tell your vaccination provider if the person getting the vaccine: Has had an allergic reaction after a previous dose of any vaccine that protects against tetanus or diphtheria, or has any severe, life-threatening allergies Has ever had Guillain-Barr Syndrome (also called "GBS") Has had severe pain or swelling after a previous dose of any vaccine that protects against tetanus or diphtheria In some cases, your health care provider may decide to postpone Td vaccination until a future visit. People with minor illnesses, such as a cold, may be vaccinated. People who are moderately or severely ill should usually wait until they recover before getting Td vaccine.  Your health care provider can give you more information. 4. Risks of a vaccine reaction Pain, redness, or swelling where the shot was given, mild fever, headache, feeling tired, and nausea, vomiting, diarrhea, or stomachache sometimes happen after Td vaccination. People sometimes faint after medical procedures,  including vaccination. Tell your provider if you feel dizzy or have vision changes or ringing in the ears.  As with any medicine, there is a very remote chance of a vaccine causing a severe allergic reaction, other serious injury, or death. 5. What if there is a serious problem? An allergic reaction could occur after the vaccinated person leaves the clinic. If you see signs of a severe allergic reaction (hives, swelling of the face and throat, difficulty breathing, a fast heartbeat, dizziness, or weakness), call 9-1-1 and get the person to the nearest hospital.  For other signs that concern you, call your health care provider.  Adverse reactions should be reported to the Vaccine Adverse Event Reporting System (VAERS). Your health care provider will usually file this report, or you can do it yourself. Visit the VAERS website at www.vaers.hhs.gov or call 1-800-822-7967. VAERS is only for reporting reactions, and VAERS staff members do not give medical advice. 6. The National Vaccine Injury Compensation Program The National Vaccine Injury Compensation Program (VICP) is a federal program that was created to compensate people who may have been injured by certain vaccines. Claims regarding alleged injury or death due to vaccination have a time limit for filing, which may be as short as two years. Visit the VICP website at www.hrsa.gov/vaccinecompensation or call 1-800-338-2382 to learn about the program and about filing a claim. 7. How can I learn more? Ask your health care provider. Call your local or state health department. Visit the website of the Food and Drug Administration (FDA) for vaccine package inserts and additional information at www.fda.gov/vaccines-blood-biologics/vaccines. Contact the Centers for Disease Control and Prevention (CDC): Call 1-800-232-4636 (1-800-CDC-INFO) or Visit   CDC's website at www.cdc.gov/vaccines. Source: CDC Vaccine Information Statement Td (Tetanus, Diphtheria) Vaccine  (08/01/2020) This same material is available at www.cdc.gov for no charge. This information is not intended to replace advice given to you by your health care provider. Make sure you discuss any questions you have with your health care provider. Document Revised: 11/11/2021 Document Reviewed: 09/14/2021 Elsevier Patient Education  2023 Elsevier Inc.  

## 2022-05-06 NOTE — Progress Notes (Signed)
ROB doing well. Feels good movement. 28 wk labs today: Glucose screen/RPR/CBC. Tdap, Blood transfusion consent completed, all questions answered. Ready set baby reviewed, see check list for topics covered. Sample birth plan given, will follow up in upcoming visits. Discussed birth control after delivery, information pamphlet given. Discussed episode that happened to her recently at home . She called EMS due to waking up not feeling well, light headed dizzy , nausea . She states paramedics did BS it was normal and BM was normal. Eventually felt better. She denied palpitation , SOB during episode. She can not remember if she was on her back when she woke. I discussed possible compression of vessels that may have affected BP and blood flow. Discussed to monitor and if she has another episode will consider cardiology consult. She verbalizes and agrees to plan.  ? ?Follow up 2 wk with Missy for ROB or sooner if needed.  ? ? ?Philip Aspen, CNM ?

## 2022-05-07 LAB — GLUCOSE, 1 HOUR GESTATIONAL: Gestational Diabetes Screen: 114 mg/dL (ref 70–139)

## 2022-05-07 LAB — CBC
Hematocrit: 38.3 % (ref 34.0–46.6)
Hemoglobin: 12.6 g/dL (ref 11.1–15.9)
MCH: 28.2 pg (ref 26.6–33.0)
MCHC: 32.9 g/dL (ref 31.5–35.7)
MCV: 86 fL (ref 79–97)
Platelets: 388 10*3/uL (ref 150–450)
RBC: 4.47 x10E6/uL (ref 3.77–5.28)
RDW: 12.6 % (ref 11.7–15.4)
WBC: 10 10*3/uL (ref 3.4–10.8)

## 2022-05-07 LAB — RPR: RPR Ser Ql: NONREACTIVE

## 2022-05-17 ENCOUNTER — Encounter: Payer: Self-pay | Admitting: Certified Nurse Midwife

## 2022-05-20 ENCOUNTER — Ambulatory Visit (INDEPENDENT_AMBULATORY_CARE_PROVIDER_SITE_OTHER): Payer: Managed Care, Other (non HMO) | Admitting: Obstetrics

## 2022-05-20 VITALS — BP 116/75 | HR 88 | Wt 219.8 lb

## 2022-05-20 DIAGNOSIS — Z3A30 30 weeks gestation of pregnancy: Secondary | ICD-10-CM

## 2022-05-20 DIAGNOSIS — Z3480 Encounter for supervision of other normal pregnancy, unspecified trimester: Secondary | ICD-10-CM

## 2022-05-20 LAB — POCT URINALYSIS DIPSTICK
Bilirubin, UA: NEGATIVE
Blood, UA: NEGATIVE
Clarity, UA: NEGATIVE
Glucose, UA: NEGATIVE
Ketones, UA: NEGATIVE
Leukocytes, UA: NEGATIVE
Nitrite, UA: NEGATIVE
Protein, UA: NEGATIVE
Spec Grav, UA: 1.02 (ref 1.010–1.025)
Urobilinogen, UA: 0.2 E.U./dL
pH, UA: 6 (ref 5.0–8.0)

## 2022-05-20 NOTE — Progress Notes (Signed)
ROB at [redacted]w[redacted]d. Active baby. Denies contractions, LOF, and vaginal bleeding. On Monday, she did have a period of time where she felt no baby movement, but he became active again at night time. Reviewed kick counts and when to go to the hospital for decreased fetal movement. Experiencing a lot of lower back and tailbone pain. Heat makes it worse; Tylenol gives minimal relief. Recommend chiropractor, cat/cow, ice, belly band, Spinning Bab[employer]. Plans epidural for birth. Reviewed normal lab results. Denies any further dizzy spells. RTC in 2 weeks.  [name] Spanish, CNM

## 2022-06-01 ENCOUNTER — Encounter: Payer: Managed Care, Other (non HMO) | Admitting: Certified Nurse Midwife

## 2022-06-01 ENCOUNTER — Ambulatory Visit (INDEPENDENT_AMBULATORY_CARE_PROVIDER_SITE_OTHER): Payer: Managed Care, Other (non HMO) | Admitting: Certified Nurse Midwife

## 2022-06-01 ENCOUNTER — Encounter: Payer: Self-pay | Admitting: Certified Nurse Midwife

## 2022-06-01 VITALS — BP 118/75 | HR 91 | Wt 221.3 lb

## 2022-06-01 DIAGNOSIS — Z3A32 32 weeks gestation of pregnancy: Secondary | ICD-10-CM

## 2022-06-01 DIAGNOSIS — Z3483 Encounter for supervision of other normal pregnancy, third trimester: Secondary | ICD-10-CM

## 2022-06-01 NOTE — Patient Instructions (Signed)
Braxton Hicks Contractions  Contractions of the uterus can occur throughout pregnancy, but they are not always a sign that you are in labor. You may have practice contractions called Braxton Hicks contractions. These false labor contractions are sometimes confused with true labor. What are Braxton Hicks contractions? Braxton Hicks contractions are tightening movements that occur in the muscles of the uterus before labor. Unlike true labor contractions, these contractions do not result in opening (dilation) and thinning of the lowest part of the uterus (cervix). Toward the end of pregnancy (32-34 weeks), Braxton Hicks contractions can happen more often and may become stronger. These contractions are sometimes difficult to tell apart from true labor because they can be very uncomfortable. How to tell the difference between true labor and false labor True labor Contractions last 30-70 seconds. Contractions become very regular. Discomfort is usually felt in the top of the uterus, and it spreads to the lower abdomen and low back. Contractions do not go away with walking. Contractions usually become stronger and more frequent. The cervix dilates and gets thinner. False labor Contractions are usually shorter, weaker, and farther apart than true labor contractions. Contractions are usually irregular. Contractions are often felt in the front of the lower abdomen and in the groin. Contractions may go away when you walk around or change positions while lying down. The cervix usually does not dilate or become thin. Sometimes, the only way to tell if you are in true labor is for your health care provider to look for changes in your cervix. Your health care provider will do a physical exam and may monitor your contractions. If you are in true labor, your health care provider will send you home with instructions about when to return to the hospital. You may continue to have Braxton Hicks contractions until you  go into true labor. Follow these instructions at home:  Take over-the-counter and prescription medicines only as told by your health care provider. If Braxton Hicks contractions are making you uncomfortable: Change your position from lying down or resting to walking, or change from walking to resting. Sit and rest in a tub of warm water. Drink enough fluid to keep your urine pale yellow. Dehydration may cause these contractions. Do slow and deep breathing several times an hour. Keep all follow-up visits. This is important. Contact a health care provider if: You have a fever. You have continuous pain in your abdomen. Your contractions become stronger, more regular, and closer together. You pass blood-tinged mucus. Get help right away if: You have fluid leaking or gushing from your vagina. You have bright red blood coming from your vagina. Your baby is not moving inside you as much as it used to. Summary You may have practice contractions called Braxton Hicks contractions. These false labor contractions are sometimes confused with true labor. Braxton Hicks contractions are usually shorter, weaker, farther apart, and less regular than true labor contractions. True labor contractions usually become stronger, more regular, and more frequent. Manage discomfort from Braxton Hicks contractions by changing position, resting in a warm bath, practicing deep breathing, and drinking plenty of water. Keep all follow-up visits. Contact your health care provider if your contractions become stronger, more regular, and closer together. This information is not intended to replace advice given to you by your health care provider. Make sure you discuss any questions you have with your health care provider. Document Revised: 10/20/2020 Document Reviewed: 10/20/2020 Elsevier Patient Education  2023 Elsevier Inc.  

## 2022-06-01 NOTE — Progress Notes (Signed)
ROB doing well, feeling good movement. Having some back pain and irregular cramping. PTL precautions reviewed. She has some sinus pressure. Discussed over the counter self help measurses. She verbalize understanding and agreement. Follow up 2 wks for rob.   Philip Aspen, CNM

## 2022-06-02 ENCOUNTER — Encounter: Payer: Managed Care, Other (non HMO) | Admitting: Certified Nurse Midwife

## 2022-06-17 ENCOUNTER — Ambulatory Visit (INDEPENDENT_AMBULATORY_CARE_PROVIDER_SITE_OTHER): Payer: Managed Care, Other (non HMO) | Admitting: Obstetrics

## 2022-06-17 VITALS — BP 100/65 | HR 89 | Wt 223.6 lb

## 2022-06-17 DIAGNOSIS — Z3A34 34 weeks gestation of pregnancy: Secondary | ICD-10-CM

## 2022-06-17 LAB — POCT URINALYSIS DIPSTICK OB
Bilirubin, UA: NEGATIVE
Blood, UA: NEGATIVE
Glucose, UA: NEGATIVE
Ketones, UA: NEGATIVE
Leukocytes, UA: NEGATIVE
Nitrite, UA: NEGATIVE
POC,PROTEIN,UA: NEGATIVE
Spec Grav, UA: 1.01 (ref 1.010–1.025)
Urobilinogen, UA: 0.2 E.U./dL
pH, UA: 6.5 (ref 5.0–8.0)

## 2022-06-17 NOTE — Progress Notes (Signed)
ROB at [redacted]w[redacted]d. Active baby. Occasional BH ctx. Denies LOF, vaginal bleeding. Having some low back pain; discussed comfort measures, exercises. Unstable lie; baby is transverse but very mobile. Encouraged [employer]. Will reassess at 36 week visit. Discussed GBS and GC/chlamydia at next visit. RTC in 2 weeks.  [name] Spanish, CNM

## 2022-06-22 ENCOUNTER — Encounter: Payer: Self-pay | Admitting: Certified Nurse Midwife

## 2022-06-23 ENCOUNTER — Telehealth: Payer: Self-pay | Admitting: Certified Nurse Midwife

## 2022-06-23 ENCOUNTER — Ambulatory Visit (INDEPENDENT_AMBULATORY_CARE_PROVIDER_SITE_OTHER): Payer: Managed Care, Other (non HMO) | Admitting: Certified Nurse Midwife

## 2022-06-23 VITALS — BP 123/79 | HR 96 | Wt 225.0 lb

## 2022-06-23 DIAGNOSIS — R109 Unspecified abdominal pain: Secondary | ICD-10-CM

## 2022-06-23 DIAGNOSIS — O26899 Other specified pregnancy related conditions, unspecified trimester: Secondary | ICD-10-CM

## 2022-06-23 LAB — POCT URINALYSIS DIPSTICK OB
Bilirubin, UA: NEGATIVE
Blood, UA: NEGATIVE
Glucose, UA: NEGATIVE
Ketones, UA: NEGATIVE
Leukocytes, UA: NEGATIVE
Nitrite, UA: NEGATIVE
POC,PROTEIN,UA: NEGATIVE
Spec Grav, UA: <=1.005 — AB (ref 1.010–1.025)
Urobilinogen, UA: 0.2 E.U./dL
pH, UA: 6.5 (ref 5.0–8.0)

## 2022-06-23 NOTE — Telephone Encounter (Signed)
Pt coming in today for visit with AT

## 2022-06-23 NOTE — Progress Notes (Signed)
Pt presents today due to concern of having cramping and loss of mucus plug yesterday. She states that she rested all day yesterday and the cramping has eased up. Reassurance given. PTL precautions reviewed. Discussed holding off on vaginal exam until 37 wk if possible due to risk of causing more contractions. Mentioned that at next visit if she is feeling lots of pressure and continues to have cramping could do vaginal exam but would prefer to wait until 37 wks. She verbalizes and agrees to plan. Follow up 1 wk as scheduled.   Doreene Burke, CNM

## 2022-07-01 ENCOUNTER — Ambulatory Visit (INDEPENDENT_AMBULATORY_CARE_PROVIDER_SITE_OTHER): Payer: Managed Care, Other (non HMO) | Admitting: Certified Nurse Midwife

## 2022-07-01 ENCOUNTER — Encounter: Payer: Self-pay | Admitting: Certified Nurse Midwife

## 2022-07-01 VITALS — BP 112/75 | HR 91 | Wt 226.9 lb

## 2022-07-01 DIAGNOSIS — Z3483 Encounter for supervision of other normal pregnancy, third trimester: Secondary | ICD-10-CM

## 2022-07-01 LAB — POCT URINALYSIS DIPSTICK OB
Bilirubin, UA: NEGATIVE
Blood, UA: NEGATIVE
Glucose, UA: NEGATIVE
Ketones, UA: NEGATIVE
Leukocytes, UA: NEGATIVE
Nitrite, UA: NEGATIVE
POC,PROTEIN,UA: NEGATIVE
Spec Grav, UA: 1.01 (ref 1.010–1.025)
Urobilinogen, UA: 0.2 E.U./dL
pH, UA: 8 (ref 5.0–8.0)

## 2022-07-01 NOTE — Progress Notes (Signed)
ROB doing well, feeling good movement. Continues to have pressure and irregular contractions. Denies that it has changed or worsened. GBS and swabs collected today. Discussed waiting to do exam until next visit. She verbalizes and agrees. Suspect vertex per [place] ( difficult due to maternal body habitus). Follow up 1 wk or prn.   Doreene Burke, CNM

## 2022-07-01 NOTE — Patient Instructions (Signed)

## 2022-07-03 LAB — STREP GP B NAA: Strep Gp B NAA: POSITIVE — AB

## 2022-07-06 ENCOUNTER — Encounter: Payer: Self-pay | Admitting: Certified Nurse Midwife

## 2022-07-07 ENCOUNTER — Other Ambulatory Visit: Payer: Self-pay

## 2022-07-07 ENCOUNTER — Other Ambulatory Visit (HOSPITAL_COMMUNITY)
Admission: RE | Admit: 2022-07-07 | Discharge: 2022-07-07 | Disposition: A | Discharge status: Home / Self Care | Payer: Managed Care, Other (non HMO) | Source: Ambulatory Visit | Attending: Certified Nurse Midwife | Admitting: Certified Nurse Midwife

## 2022-07-07 DIAGNOSIS — Z3483 Encounter for supervision of other normal pregnancy, third trimester: Secondary | ICD-10-CM | POA: Diagnosis present

## 2022-07-08 ENCOUNTER — Ambulatory Visit (INDEPENDENT_AMBULATORY_CARE_PROVIDER_SITE_OTHER): Payer: Managed Care, Other (non HMO) | Admitting: Obstetrics

## 2022-07-08 ENCOUNTER — Encounter: Payer: Self-pay | Admitting: Obstetrics

## 2022-07-08 VITALS — BP 118/76 | HR 98 | Wt 228.0 lb

## 2022-07-08 DIAGNOSIS — Z3483 Encounter for supervision of other normal pregnancy, third trimester: Secondary | ICD-10-CM

## 2022-07-08 DIAGNOSIS — Z3A37 37 weeks gestation of pregnancy: Secondary | ICD-10-CM

## 2022-07-08 LAB — POCT URINALYSIS DIPSTICK OB
Bilirubin, UA: NEGATIVE
Blood, UA: NEGATIVE
Glucose, UA: NEGATIVE
Ketones, UA: NEGATIVE
Leukocytes, UA: NEGATIVE
Nitrite, UA: NEGATIVE
POC,PROTEIN,UA: NEGATIVE
Spec Grav, UA: 1.02 (ref 1.010–1.025)
Urobilinogen, UA: 0.2 E.U./dL
pH, UA: 6 (ref 5.0–8.0)

## 2022-07-08 LAB — CERVICOVAGINAL ANCILLARY ONLY
Chlamydia: NEGATIVE
Comment: NEGATIVE
Comment: NORMAL
Neisseria Gonorrhea: NEGATIVE

## 2022-07-08 NOTE — Progress Notes (Signed)
ROB at [redacted]w[redacted]d. Active baby. Having some runs of contractions and increased pressure. Denies vaginal bleeding and LOF. Everything is ready at home. Discussed when to go to the hospital. BMI>40. Counseled on ACOG recommendation for IOL in 39th week, option to decline if desired. Ilda would like to schedule IOL. Scheduled for 07/23/22 @ 0500. Weekly NSTs. RNST today (see note). RTC in one week for ROB/NST.  [name] Spanish, CNM

## 2022-07-12 ENCOUNTER — Other Ambulatory Visit: Payer: Self-pay | Admitting: Obstetrics

## 2022-07-14 ENCOUNTER — Other Ambulatory Visit: Payer: Managed Care, Other (non HMO)

## 2022-07-14 ENCOUNTER — Encounter: Payer: Self-pay | Admitting: Certified Nurse Midwife

## 2022-07-14 ENCOUNTER — Ambulatory Visit (INDEPENDENT_AMBULATORY_CARE_PROVIDER_SITE_OTHER): Payer: Managed Care, Other (non HMO) | Admitting: Certified Nurse Midwife

## 2022-07-14 DIAGNOSIS — O99213 Obesity complicating pregnancy, third trimester: Secondary | ICD-10-CM

## 2022-07-14 NOTE — Progress Notes (Signed)
ROB and NST today due to BMI >40. SVE per pt request 3-4/70/-2 .  NST: reactive Baseline: 140 Variability: moderate Accelerations: present  Decelerations: absent  CTX: irregular  Pt to follow up 1 wk for ROBand NST.    Doreene Burke, CNM

## 2022-07-14 NOTE — Patient Instructions (Signed)
Braxton Hicks Contractions  Contractions of the uterus can occur throughout pregnancy, but they are not always a sign that you are in labor. You may have practice contractions called Braxton Hicks contractions. These false labor contractions are sometimes confused with true labor. What are Braxton Hicks contractions? Braxton Hicks contractions are tightening movements that occur in the muscles of the uterus before labor. Unlike true labor contractions, these contractions do not result in opening (dilation) and thinning of the lowest part of the uterus (cervix). Toward the end of pregnancy (32-34 weeks), Braxton Hicks contractions can happen more often and may become stronger. These contractions are sometimes difficult to tell apart from true labor because they can be very uncomfortable. How to tell the difference between true labor and false labor True labor Contractions last 30-70 seconds. Contractions become very regular. Discomfort is usually felt in the top of the uterus, and it spreads to the lower abdomen and low back. Contractions do not go away with walking. Contractions usually become stronger and more frequent. The cervix dilates and gets thinner. False labor Contractions are usually shorter, weaker, and farther apart than true labor contractions. Contractions are usually irregular. Contractions are often felt in the front of the lower abdomen and in the groin. Contractions may go away when you walk around or change positions while lying down. The cervix usually does not dilate or become thin. Sometimes, the only way to tell if you are in true labor is for your health care provider to look for changes in your cervix. Your health care provider will do a physical exam and may monitor your contractions. If you are in true labor, your health care provider will send you home with instructions about when to return to the hospital. You may continue to have Braxton Hicks contractions until you  go into true labor. Follow these instructions at home:  Take over-the-counter and prescription medicines only as told by your health care provider. If Braxton Hicks contractions are making you uncomfortable: Change your position from lying down or resting to walking, or change from walking to resting. Sit and rest in a tub of warm water. Drink enough fluid to keep your urine pale yellow. Dehydration may cause these contractions. Do slow and deep breathing several times an hour. Keep all follow-up visits. This is important. Contact a health care provider if: You have a fever. You have continuous pain in your abdomen. Your contractions become stronger, more regular, and closer together. You pass blood-tinged mucus. Get help right away if: You have fluid leaking or gushing from your vagina. You have bright red blood coming from your vagina. Your baby is not moving inside you as much as it used to. Summary You may have practice contractions called Braxton Hicks contractions. These false labor contractions are sometimes confused with true labor. Braxton Hicks contractions are usually shorter, weaker, farther apart, and less regular than true labor contractions. True labor contractions usually become stronger, more regular, and more frequent. Manage discomfort from Braxton Hicks contractions by changing position, resting in a warm bath, practicing deep breathing, and drinking plenty of water. Keep all follow-up visits. Contact your health care provider if your contractions become stronger, more regular, and closer together. This information is not intended to replace advice given to you by your health care provider. Make sure you discuss any questions you have with your health care provider. Document Revised: 10/20/2020 Document Reviewed: 10/20/2020 Elsevier Patient Education  2023 Elsevier Inc.  

## 2022-07-15 ENCOUNTER — Observation Stay
Admission: EM | Admit: 2022-07-15 | Discharge: 2022-07-16 | Disposition: A | Discharge status: Home / Self Care | Payer: Managed Care, Other (non HMO) | Attending: Certified Nurse Midwife | Admitting: Certified Nurse Midwife

## 2022-07-15 DIAGNOSIS — O471 False labor at or after 37 completed weeks of gestation: Principal | ICD-10-CM | POA: Insufficient documentation

## 2022-07-15 DIAGNOSIS — Z3A38 38 weeks gestation of pregnancy: Secondary | ICD-10-CM | POA: Insufficient documentation

## 2022-07-15 DIAGNOSIS — Z7982 Long term (current) use of aspirin: Secondary | ICD-10-CM | POA: Insufficient documentation

## 2022-07-16 ENCOUNTER — Other Ambulatory Visit: Payer: Self-pay

## 2022-07-16 ENCOUNTER — Encounter: Payer: Self-pay | Admitting: Obstetrics and Gynecology

## 2022-07-16 DIAGNOSIS — O26893 Other specified pregnancy related conditions, third trimester: Secondary | ICD-10-CM | POA: Diagnosis not present

## 2022-07-16 DIAGNOSIS — Z7982 Long term (current) use of aspirin: Secondary | ICD-10-CM | POA: Diagnosis not present

## 2022-07-16 DIAGNOSIS — R109 Unspecified abdominal pain: Secondary | ICD-10-CM | POA: Diagnosis not present

## 2022-07-16 DIAGNOSIS — O471 False labor at or after 37 completed weeks of gestation: Secondary | ICD-10-CM | POA: Diagnosis not present

## 2022-07-16 DIAGNOSIS — Z3A38 38 weeks gestation of pregnancy: Secondary | ICD-10-CM | POA: Diagnosis not present

## 2022-07-16 NOTE — OB Triage Note (Signed)
    L&D OB Triage Note  SUBJECTIVE Shannon [name] is a 31 y.o. [id number] female at [redacted]w[redacted]d, EDD Estimated Date of Delivery: 07/27/22 who presented to triage with complaints of contractions. She denies LOF, vaginal bleeding and is feeling good fetal movement. .   OB History  Gravida Para Term Preterm AB Living  4 2 2  0 1 2  SAB IAB Ectopic Multiple Live Births  1 0 0 0 2    # Outcome Date GA Lbr Len/2nd Weight Sex Delivery Anes PTL Lv  4 Current           3 Term 07/26/19 [redacted]w[redacted]d / 00:33 3720 g M Vag-Spont EPI  LIV     Name: Shannon [name]     Apgar1: 8  Apgar5: 9  2 SAB 2019          1 Term 05/21/16 [redacted]w[redacted]d 302:24 / 01:01 3590 g F Vag-Spont EPI  LIV     Name: [redacted]w[redacted]d     Apgar1: 8  Apgar5: 9    Medications Prior to Admission  Medication Sig Dispense Refill Last Dose   aspirin EC 81 MG tablet Take 1 tablet (81 mg total) by mouth daily. Swallow whole. Start 14-[redacted] weeks pregnant 30 tablet 11    Prenatal Vit-Fe Fumarate-FA (MULTIVITAMIN-PRENATAL) 27-0.8 MG TABS tablet Take 1 tablet by mouth daily at 12 noon.        OBJECTIVE  Nursing Evaluation:   BP 121/72 (BP Location: Left Arm)   Pulse 87   Temp 98.4 F (36.9 C) (Oral)   Resp 18   Ht 5\' 3"  (1.6 m)   Wt 103.9 kg   LMP 10/09/2021 (Exact Date)   BMI 40.57 kg/m    Findings:   Reactive NST          NST was performed and has been reviewed by me.  NST INTERPRETATION: Category I  Mode: External Baseline Rate (A): 140 bpm Variability: Moderate Accelerations: 15 x 15 Decelerations: None     Contraction Frequency (min): 3-8  ASSESSMENT Impression:  1.  Pregnancy:  at [redacted]w[redacted]d , EDD Estimated Date of Delivery: 07/27/22 2.  Reassuring fetal and maternal status 3.  Irregular contractions, false labor  PLAN 1. Current condition and above findings reviewed.  Reassuring fetal and maternal condition. 2. Discharge home with standard labor precautions given to return to L&D or call the office for problems. 3.  Continue routine prenatal care.  [redacted]w[redacted]d, CNM

## 2022-07-16 NOTE — OB Triage Note (Signed)
Shannon [name] 30 y.o. presents to Labor & Delivery triage via wheelchair steered by ED staff reporting contractions. She is a [id number] at [redacted]w[redacted]d . She denies signs and symptoms consistent with rupture of membranes or active vaginal bleeding. States positive fetal movement. External FM and TOCO applied to non-tender abdomen. Vital signs obtained and within normal limits. Patient oriented to care environment including call bell and bed control use. Doreene Burke, CNM notified of patient's arrival. Plan to labor eval.

## 2022-07-19 ENCOUNTER — Encounter: Payer: Self-pay | Admitting: Obstetrics

## 2022-07-19 ENCOUNTER — Other Ambulatory Visit: Payer: Self-pay

## 2022-07-19 ENCOUNTER — Observation Stay
Admission: EM | Admit: 2022-07-19 | Discharge: 2022-07-19 | Disposition: A | Discharge status: Home / Self Care | Payer: Managed Care, Other (non HMO) | Attending: Certified Nurse Midwife | Admitting: Certified Nurse Midwife

## 2022-07-19 DIAGNOSIS — Z3A38 38 weeks gestation of pregnancy: Secondary | ICD-10-CM | POA: Insufficient documentation

## 2022-07-19 DIAGNOSIS — Z7982 Long term (current) use of aspirin: Secondary | ICD-10-CM | POA: Diagnosis not present

## 2022-07-19 DIAGNOSIS — O368130 Decreased fetal movements, third trimester, not applicable or unspecified: Principal | ICD-10-CM | POA: Insufficient documentation

## 2022-07-19 DIAGNOSIS — Z79899 Other long term (current) drug therapy: Secondary | ICD-10-CM | POA: Insufficient documentation

## 2022-07-19 LAB — RUPTURE OF MEMBRANE (ROM)PLUS: Rom Plus: NEGATIVE

## 2022-07-19 NOTE — OB Triage Note (Signed)
Pt arrives G4 P2 with LOF for the past 2 hours and decreased fetal movement throughout the day. Pt also reports some itching and yellowish DC.

## 2022-07-19 NOTE — OB Triage Note (Signed)
    L&D OB Triage Note  SUBJECTIVE Shannon [name] is a 31 y.o. [id number] female at [redacted]w[redacted]d, EDD Estimated Date of Delivery: 07/27/22 who presented to triage with complaints of decreased fetal movement and LOF x 2 hours. .   OB History  Gravida Para Term Preterm AB Living  4 2 2  0 1 2  SAB IAB Ectopic Multiple Live Births  1 0 0 0 2    # Outcome Date GA Lbr Len/2nd Weight Sex Delivery Anes PTL Lv  4 Current           3 Term 07/26/19 [redacted]w[redacted]d / 00:33 3720 g M Vag-Spont EPI  LIV     Name: Shannon [name]     Apgar1: 8  Apgar5: 9  2 SAB 2019          1 Term 05/21/16 [redacted]w[redacted]d 302:24 / 01:01 3590 g F Vag-Spont EPI  LIV     Name: [redacted]w[redacted]d     Apgar1: 8  Apgar5: 9    Medications Prior to Admission  Medication Sig Dispense Refill Last Dose   aspirin EC 81 MG tablet Take 1 tablet (81 mg total) by mouth daily. Swallow whole. Start 14-[redacted] weeks pregnant 30 tablet 11 Past Week   Prenatal Vit-Fe Fumarate-FA (MULTIVITAMIN-PRENATAL) 27-0.8 MG TABS tablet Take 1 tablet by mouth daily at 12 noon.   Past Week     OBJECTIVE  Nursing Evaluation:   BP 116/71   Pulse 83   Temp 98.5 F (36.9 C) (Oral)   Resp 18   Ht 5\' 3"  (1.6 m)   Wt 103.4 kg   LMP 10/09/2021 (Exact Date)   BMI 40.39 kg/m    Findings:   reactive NST, ROM plus negative       NST was performed and has been reviewed by me.  NST INTERPRETATION: Category I  Mode: External Baseline Rate (A): 135 bpm Variability: Moderate Accelerations: 15 x 15 Decelerations: None     Contraction Frequency (min): occas  ASSESSMENT Impression:  1.  Pregnancy:  at [redacted]w[redacted]d , EDD Estimated Date of Delivery: 07/27/22 2.  Reassuring fetal and maternal status 3.  Reactive NST,  4. ROM plus negative   PLAN 1. Current condition and above findings reviewed.  Reassuring fetal and maternal condition. 2. Discharge home with standard labor precautions given to return to L&D or call the office for problems. 3. Continue routine prenatal  care.  [redacted]w[redacted]d, CNM

## 2022-07-21 ENCOUNTER — Other Ambulatory Visit: Payer: Managed Care, Other (non HMO)

## 2022-07-21 ENCOUNTER — Encounter: Payer: Self-pay | Admitting: Certified Nurse Midwife

## 2022-07-21 ENCOUNTER — Ambulatory Visit (INDEPENDENT_AMBULATORY_CARE_PROVIDER_SITE_OTHER): Payer: Managed Care, Other (non HMO) | Admitting: Certified Nurse Midwife

## 2022-07-21 VITALS — BP 110/71 | HR 94 | Wt 227.0 lb

## 2022-07-21 DIAGNOSIS — Z3A39 39 weeks gestation of pregnancy: Secondary | ICD-10-CM

## 2022-07-21 LAB — POCT URINALYSIS DIPSTICK OB
Bilirubin, UA: NEGATIVE
Blood, UA: NEGATIVE
Glucose, UA: NEGATIVE
Ketones, UA: NEGATIVE
Nitrite, UA: NEGATIVE
POC,PROTEIN,UA: NEGATIVE
Urobilinogen, UA: 0.2 E.U./dL
pH, UA: 5 (ref 5.0–8.0)

## 2022-07-21 NOTE — Addendum Note (Signed)
Addended by: Donnetta Hail on: 07/21/2022 11:44 AM   Modules accepted: Orders

## 2022-07-21 NOTE — Progress Notes (Signed)
ROB- no concerns 

## 2022-07-21 NOTE — Patient Instructions (Signed)
Labor Induction Labor induction is when steps are taken to cause a pregnant woman to begin the labor process. Most women go into labor on their own between 37 weeks and 42 weeks of pregnancy. When this does not happen, or when there is a medical need for labor to begin, steps may be taken to induce, or bring on, labor. Labor induction causes a pregnant woman's uterus to contract. It also causes the cervix to soften (ripen), open (dilate), and thin out. Usually, labor is not induced before 39 weeks of pregnancy unless there is a medical reason to do so. When is labor induction considered? Labor induction may be right for you if: Your pregnancy lasts longer than 41 to 42 weeks. Your placenta is separating from your uterus (placental abruption). You have a rupture of membranes and your labor does not begin. You have health problems, like diabetes or high blood pressure (preeclampsia) during your pregnancy. Your baby has stopped growing or does not have enough amniotic fluid. Before labor induction begins, your health care provider will consider the following factors: Your medical condition and the baby's condition. How many weeks you have been pregnant. How mature the baby's lungs are. The condition of your cervix. The position of the baby. The size of your birth canal. Tell a health care provider about: Any allergies you have. All medicines you are taking, including vitamins, herbs, eye drops, creams, and over-the-counter medicines. Any problems you or your family members have had with anesthetic medicines. Any surgeries you have had. Any blood disorders you have. Any medical conditions you have. What are the risks? Generally, this is a safe procedure. However, problems may occur, including: Failed induction. Changes in fetal heart rate, such as being too high, too low, or irregular (erratic). Infection in the mother or the baby. Increased risk of having a cesarean delivery. Breaking off  (abruption) of the placenta from the uterus. This is rare. Rupture of the uterus. This is very rare. Your baby could fail to get enough blood flow or oxygen. This can be life-threatening. When induction is needed for medical reasons, the benefits generally outweigh the risks. What happens during the procedure? During the procedure, your health care provider will use one of these methods to induce labor: Stripping the membranes. In this method, the amniotic sac tissue is gently separated from the cervix. This causes the following to happen: Your cervix stretches, which in turn causes the release of prostaglandins. Prostaglandins induce labor and cause the uterus to contract. This procedure is often done in an office visit. You will be sent home to wait for contractions to begin. Prostaglandin medicine. This medicine starts contractions and causes the cervix to dilate and ripen. This can be taken by mouth (orally) or by being inserted into the vagina (suppository). Inserting a small, thin tube (catheter) with a balloon into the vagina and then expanding the balloon with water to dilate the cervix. Breaking the water. In this method, a small instrument is used to make a small hole in the amniotic sac. This eventually causes the amniotic sac to break. Contractions should begin within a few hours. Medicine to trigger or strengthen contractions. This medicine is given through an IV that is inserted into a vein in your arm. This procedure may vary among health care providers and hospitals. Where to find more information [employer]: www.marchofdimes.org The American College of Obstetricians and Gynecologists: www.acog.org Summary Labor induction causes a pregnant woman's uterus to contract. It also causes the cervix   to soften (ripen), open (dilate), and thin out. Labor is usually not induced before 39 weeks of pregnancy unless there is a medical reason to do so. When induction is needed for medical  reasons, the benefits generally outweigh the risks. Talk with your health care provider about which methods of labor induction are right for you. This information is not intended to replace advice given to you by your health care provider. Make sure you discuss any questions you have with your health care provider. Document Revised: 09/25/2020 Document Reviewed: 09/25/2020 Elsevier Patient Education  2023 Elsevier Inc.  

## 2022-07-21 NOTE — Progress Notes (Signed)
ROB doing well, Did not do nst as pt had done at the hospital 2 days ago. Discussed induction. Encouraged pt to eat light breakfast prior to admission. Reviewed labor precautions. Follow up prn or for postpartum visit.   Doreene Burke, CNM

## 2022-07-23 ENCOUNTER — Encounter: Payer: Self-pay | Admitting: Obstetrics

## 2022-07-23 ENCOUNTER — Other Ambulatory Visit: Payer: Self-pay

## 2022-07-23 ENCOUNTER — Inpatient Hospital Stay: Payer: Managed Care, Other (non HMO) | Admitting: General Practice

## 2022-07-23 ENCOUNTER — Inpatient Hospital Stay
Admission: EM | Admit: 2022-07-23 | Discharge: 2022-07-25 | DRG: 807 | Disposition: A | Discharge status: Home / Self Care | Payer: Managed Care, Other (non HMO) | Attending: Obstetrics | Admitting: Obstetrics

## 2022-07-23 DIAGNOSIS — E669 Obesity, unspecified: Secondary | ICD-10-CM | POA: Diagnosis not present

## 2022-07-23 DIAGNOSIS — O99824 Streptococcus B carrier state complicating childbirth: Secondary | ICD-10-CM | POA: Diagnosis present

## 2022-07-23 DIAGNOSIS — Z3A39 39 weeks gestation of pregnancy: Secondary | ICD-10-CM

## 2022-07-23 DIAGNOSIS — Z7982 Long term (current) use of aspirin: Secondary | ICD-10-CM

## 2022-07-23 DIAGNOSIS — O99214 Obesity complicating childbirth: Secondary | ICD-10-CM | POA: Diagnosis not present

## 2022-07-23 LAB — CBC
HCT: 34.9 % — ABNORMAL LOW (ref 36.0–46.0)
Hemoglobin: 11.2 g/dL — ABNORMAL LOW (ref 12.0–15.0)
MCH: 25.8 pg — ABNORMAL LOW (ref 26.0–34.0)
MCHC: 32.1 g/dL (ref 30.0–36.0)
MCV: 80.4 fL (ref 80.0–100.0)
Platelets: 429 10*3/uL — ABNORMAL HIGH (ref 150–400)
RBC: 4.34 MIL/uL (ref 3.87–5.11)
RDW: 13.7 % (ref 11.5–15.5)
WBC: 10 10*3/uL (ref 4.0–10.5)
nRBC: 0 % (ref 0.0–0.2)

## 2022-07-23 LAB — TYPE AND SCREEN
ABO/RH(D): O POS
Antibody Screen: NEGATIVE

## 2022-07-23 MED ORDER — LACTATED RINGERS IV SOLN
500.0000 mL | Freq: Once | INTRAVENOUS | Status: AC
  Administered 2022-07-23: 500 mL via INTRAVENOUS

## 2022-07-23 MED ORDER — WITCH HAZEL-GLYCERIN EX PADS
MEDICATED_PAD | CUTANEOUS | Status: DC | PRN
  Filled 2022-07-23: qty 100

## 2022-07-23 MED ORDER — PRENATAL MULTIVITAMIN CH
1.0000 | ORAL_TABLET | Freq: Every day | ORAL | Status: DC
  Administered 2022-07-24 – 2022-07-25 (×2): 1 via ORAL
  Filled 2022-07-23 (×2): qty 1

## 2022-07-23 MED ORDER — PHENYLEPHRINE 80 MCG/ML (10ML) SYRINGE FOR IV PUSH (FOR BLOOD PRESSURE SUPPORT): 80.0000 ug | PREFILLED_SYRINGE | INTRAVENOUS | Status: DC | PRN

## 2022-07-23 MED ORDER — FENTANYL-BUPIVACAINE-NACL 0.5-0.125-0.9 MG/250ML-% EP SOLN
EPIDURAL | Status: AC
  Filled 2022-07-23: qty 250

## 2022-07-23 MED ORDER — COCONUT OIL OIL: 1.0000 | TOPICAL_OIL | Status: DC | PRN

## 2022-07-23 MED ORDER — OXYTOCIN-SODIUM CHLORIDE 30-0.9 UT/500ML-% IV SOLN: 1.0000 m[IU]/min | INTRAVENOUS | Status: DC

## 2022-07-23 MED ORDER — LACTATED RINGERS IV SOLN: INTRAVENOUS | Status: DC

## 2022-07-23 MED ORDER — OXYTOCIN-SODIUM CHLORIDE 30-0.9 UT/500ML-% IV SOLN: 2.5000 [IU]/h | INTRAVENOUS | Status: DC

## 2022-07-23 MED ORDER — ACETAMINOPHEN 325 MG PO TABS
650.0000 mg | ORAL_TABLET | ORAL | Status: DC | PRN
  Administered 2022-07-24 – 2022-07-25 (×3): 650 mg via ORAL
  Filled 2022-07-23 (×3): qty 2

## 2022-07-23 MED ORDER — OXYTOCIN-SODIUM CHLORIDE 30-0.9 UT/500ML-% IV SOLN
2.5000 [IU]/h | INTRAVENOUS | Status: DC | PRN
  Administered 2022-07-23: 2.5 [IU]/h via INTRAVENOUS
  Filled 2022-07-23: qty 500

## 2022-07-23 MED ORDER — LIDOCAINE-EPINEPHRINE (PF) 1.5 %-1:200000 IJ SOLN
INTRAMUSCULAR | Status: DC | PRN
  Administered 2022-07-23: 3 mL via EPIDURAL

## 2022-07-23 MED ORDER — ACETAMINOPHEN 325 MG PO TABS: 650.0000 mg | ORAL_TABLET | ORAL | Status: DC | PRN

## 2022-07-23 MED ORDER — METHYLERGONOVINE MALEATE 0.2 MG/ML IJ SOLN: 0.2000 mg | INTRAMUSCULAR | Status: DC | PRN

## 2022-07-23 MED ORDER — HYDROXYZINE HCL 25 MG PO TABS: 50.0000 mg | ORAL_TABLET | Freq: Four times a day (QID) | ORAL | Status: DC | PRN

## 2022-07-23 MED ORDER — ONDANSETRON HCL 4 MG/2ML IJ SOLN
4.0000 mg | Freq: Four times a day (QID) | INTRAMUSCULAR | Status: DC | PRN
  Administered 2022-07-23: 4 mg via INTRAVENOUS
  Filled 2022-07-23: qty 2

## 2022-07-23 MED ORDER — SIMETHICONE 80 MG PO CHEW
80.0000 mg | CHEWABLE_TABLET | ORAL | Status: DC | PRN
  Administered 2022-07-24 – 2022-07-25 (×2): 80 mg via ORAL
  Filled 2022-07-23 (×3): qty 1

## 2022-07-23 MED ORDER — BUPIVACAINE HCL (PF) 0.25 % IJ SOLN
INTRAMUSCULAR | Status: DC | PRN
  Administered 2022-07-23: 4 mL via EPIDURAL
  Administered 2022-07-23: 3 mL via EPIDURAL

## 2022-07-23 MED ORDER — IBUPROFEN 600 MG PO TABS
600.0000 mg | ORAL_TABLET | Freq: Four times a day (QID) | ORAL | Status: DC
  Administered 2022-07-23 – 2022-07-25 (×8): 600 mg via ORAL
  Filled 2022-07-23 (×8): qty 1

## 2022-07-23 MED ORDER — LACTATED RINGERS IV SOLN: 500.0000 mL | INTRAVENOUS | Status: DC | PRN

## 2022-07-23 MED ORDER — PENICILLIN G POT IN DEXTROSE 60000 UNIT/ML IV SOLN
3.0000 10*6.[IU] | INTRAVENOUS | Status: DC
  Administered 2022-07-23: 3 10*6.[IU] via INTRAVENOUS
  Filled 2022-07-23: qty 50

## 2022-07-23 MED ORDER — FENTANYL-BUPIVACAINE-NACL 0.5-0.125-0.9 MG/250ML-% EP SOLN
EPIDURAL | Status: DC | PRN
  Administered 2022-07-23: 12 mL/h via EPIDURAL

## 2022-07-23 MED ORDER — MISOPROSTOL 50MCG HALF TABLET
50.0000 ug | ORAL_TABLET | ORAL | Status: DC | PRN
  Administered 2022-07-23: 50 ug via VAGINAL
  Filled 2022-07-23: qty 1

## 2022-07-23 MED ORDER — SODIUM CHLORIDE 0.9 % IV SOLN
5.0000 10*6.[IU] | Freq: Once | INTRAVENOUS | Status: AC
  Administered 2022-07-23: 5 10*6.[IU] via INTRAVENOUS
  Filled 2022-07-23: qty 5

## 2022-07-23 MED ORDER — FENTANYL-BUPIVACAINE-NACL 0.5-0.125-0.9 MG/250ML-% EP SOLN: 12.0000 mL/h | EPIDURAL | Status: DC | PRN

## 2022-07-23 MED ORDER — MISOPROSTOL 50MCG HALF TABLET
ORAL_TABLET | ORAL | Status: AC
  Filled 2022-07-23: qty 1

## 2022-07-23 MED ORDER — EPHEDRINE 5 MG/ML INJ: 10.0000 mg | INTRAVENOUS | Status: DC | PRN

## 2022-07-23 MED ORDER — BENZOCAINE-MENTHOL 20-0.5 % EX AERO
1.0000 | INHALATION_SPRAY | CUTANEOUS | Status: DC | PRN
  Filled 2022-07-23: qty 56

## 2022-07-23 MED ORDER — FENTANYL CITRATE (PF) 100 MCG/2ML IJ SOLN: 50.0000 ug | INTRAMUSCULAR | Status: DC | PRN

## 2022-07-23 MED ORDER — DOCUSATE SODIUM 100 MG PO CAPS
100.0000 mg | ORAL_CAPSULE | Freq: Two times a day (BID) | ORAL | Status: DC
  Administered 2022-07-23 – 2022-07-25 (×4): 100 mg via ORAL
  Filled 2022-07-23 (×4): qty 1

## 2022-07-23 MED ORDER — LIDOCAINE HCL (PF) 1 % IJ SOLN
INTRAMUSCULAR | Status: DC | PRN
  Administered 2022-07-23: 3 mL via SUBCUTANEOUS

## 2022-07-23 MED ORDER — DIPHENHYDRAMINE HCL 50 MG/ML IJ SOLN: 12.5000 mg | INTRAMUSCULAR | Status: DC | PRN

## 2022-07-23 MED ORDER — LIDOCAINE HCL (PF) 1 % IJ SOLN: 30.0000 mL | INTRAMUSCULAR | Status: DC | PRN

## 2022-07-23 MED ORDER — DIPHENHYDRAMINE HCL 25 MG PO CAPS: 25.0000 mg | ORAL_CAPSULE | Freq: Four times a day (QID) | ORAL | Status: DC | PRN

## 2022-07-23 MED ORDER — METHYLERGONOVINE MALEATE 0.2 MG PO TABS: 0.2000 mg | ORAL_TABLET | ORAL | Status: DC | PRN

## 2022-07-23 MED ORDER — OXYTOCIN-SODIUM CHLORIDE 30-0.9 UT/500ML-% IV SOLN
INTRAVENOUS | Status: AC
  Administered 2022-07-23: 2 m[IU]/min via INTRAVENOUS
  Filled 2022-07-23: qty 500

## 2022-07-23 MED ORDER — OXYTOCIN BOLUS FROM INFUSION
333.0000 mL | Freq: Once | INTRAVENOUS | Status: AC
  Administered 2022-07-23: 333 mL via INTRAVENOUS

## 2022-07-23 NOTE — Progress Notes (Signed)
LABOR NOTE   SUBJECTIVE:   Shannon [name] is a 31 y.o.GP@ at [redacted]w[redacted]d whose labor is being induced for BMI>40. She has been progressing well on Pitocin. She is now feeling rectal pressure with contractions.  Analgesia: Epidural  OBJECTIVE:  BP 126/66   Pulse 74   Temp 98.2 F (36.8 C) (Oral)   Resp 16   Ht 5\' 3"  (1.6 m)   Wt 103 kg   LMP 10/09/2021 (Exact Date)   SpO2 98%   BMI 40.22 kg/m  Total I/O In: 2203.3 [P.O.:800; I.V.:1103.3; IV Piggyback:300] Out: 550 [Urine:550]  SVE:   Dilation: 8.5 Effacement (%): 80 Station: 0 Exam by:: M Edge RN CONTRACTIONS: regular, every 2 minutes FHR: Fetal heart tracing reviewed. Baseline: 145 Variability: moderate Accelerations: present Decelerations:early Category 1  Labs: Lab Results  Component Value Date   WBC 10.0 07/23/2022   HGB 11.2 (L) 07/23/2022   HCT 34.9 (L) 07/23/2022   MCV 80.4 07/23/2022   PLT 429 (H) 07/23/2022    ASSESSMENT: 1) Induction of labor due to elevated BMI,  progressing well on pitocin     Coping: well     Membranes: AROM, clear/blood-tinged fluid     Active Problems:   Labor and delivery, indication for care   PLAN: Continue present management Position changes with peanut ball Anticipate NSVD Dr. 07/25/2022 updated  Valentino Saxon, CNM 07/23/2022 5:42 PM

## 2022-07-23 NOTE — Progress Notes (Addendum)
LABOR NOTE   SUBJECTIVE:   Shannon [name] is a 31 y.o.GP@ at [redacted]w[redacted]d who is having her labor induced for BMI>40. She has received one dose of misoprostol and is feeling irregular menstrual-like cramps.  Analgesia:  Plans epidural  OBJECTIVE:  BP 126/76 (BP Location: Right Arm)   Pulse 76   Temp 98 F (36.7 C) (Oral)   Resp 16   Ht 5\' 3"  (1.6 m)   Wt 103 kg   LMP 10/09/2021 (Exact Date)   BMI 40.22 kg/m  No intake/output data recorded.  SVE:   Dilation: 3.5 Effacement (%): 70 Station: -3 Exam by:: [email or web address] CNM CONTRACTIONS: irregular, every 2-4 minutes FHR: Fetal heart tracing reviewed. Baseline: 150 Variability: currently moderate, some periods of minimal Accelerations: present Decelerations:none Category 1  Labs: Lab Results  Component Value Date   WBC 10.0 07/23/2022   HGB 11.2 (L) 07/23/2022   HCT 34.9 (L) 07/23/2022   MCV 80.4 07/23/2022   PLT 429 (H) 07/23/2022    ASSESSMENT: 1) IOL due to elevated BMI. Cervical ripening progressing well.     Coping: well     Membranes: intact  Active Problems:   Labor and delivery, indication for care   PLAN: IV Pitocin augmentation GBS prophylaxis Epidural when desired Anticipate NSVD   07/25/2022, CNM 07/23/2022 10:48 AM

## 2022-07-23 NOTE — Anesthesia Preprocedure Evaluation (Signed)
Anesthesia Evaluation  Patient identified by MRN, date of birth, ID band Patient awake    Reviewed: Allergy & Precautions, H&P , NPO status , Patient's Chart, lab work & pertinent test results  History of Anesthesia Complications Negative for: history of anesthetic complications  Airway Mallampati: III  TM Distance: <3 FB Neck ROM: full    Dental  (+) Chipped   Pulmonary neg pulmonary ROS,           Cardiovascular Exercise Tolerance: Good (-) hypertensionnegative cardio ROS       Neuro/Psych  Headaches, PSYCHIATRIC DISORDERS Anxiety Depression    GI/Hepatic GERD  Controlled,  Endo/Other    Renal/GU   negative genitourinary   Musculoskeletal   Abdominal   Peds  Hematology negative hematology ROS (+)   Anesthesia Other Findings Patient first tested positive for COVID on 7/2  Past Medical History: No date: Anxiety No date: Depression No date: Migraines 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  Past Surgical History: No date: CHOLECYSTECTOMY No date: FOOT SURGERY; Left No date: KNEE SURGERY; Left  BMI    Body Mass Index: 39.33 kg/m      Reproductive/Obstetrics (+) Pregnancy                             Anesthesia Physical Anesthesia Plan  ASA: 2  Anesthesia Plan: Epidural   Post-op Pain Management:    Induction:   PONV Risk Score and Plan:   Airway Management Planned:   Additional Equipment:   Intra-op Plan:   Post-operative Plan:   Informed Consent: I have reviewed the patients History and Physical, chart, labs and discussed the procedure including the risks, benefits and alternatives for the proposed anesthesia with the patient or authorized representative who has indicated his/her understanding and acceptance.       Plan Discussed with: Anesthesiologist  Anesthesia Plan Comments:         Anesthesia Quick Evaluation

## 2022-07-23 NOTE — Progress Notes (Signed)
EFM removed and Pt Saline Locked per CNM.

## 2022-07-23 NOTE — Progress Notes (Addendum)
LABOR NOTE   SUBJECTIVE:   Shannon [name] is a 31 y.o.GP@ at [redacted]w[redacted]d whose labor is being induced for BMI>40. She received cervical ripening with misoprostol and is making cervical change with Pitocin. She is now comfortable with her epidural. She is receiving penicillin for GBS prophylaxis.  Analgesia: Epidural  OBJECTIVE:  BP 111/68   Pulse 78   Temp 98.1 F (36.7 C) (Oral)   Resp 16   Ht 5\' 3"  (1.6 m)   Wt 103 kg   LMP 10/09/2021 (Exact Date)   BMI 40.22 kg/m  No intake/output data recorded.  SVE:   Dilation: 4.5 Effacement (%): 70 Station: -2 Exam by:: [email or web address] RN CONTRACTIONS: regular, every 1.5-3 minutes FHR: Fetal heart tracing reviewed. Baseline:145 Variability: moderate Accelerations: Decelerations:none Category 1  Labs: Lab Results  Component Value Date   WBC 10.0 07/23/2022   HGB 11.2 (L) 07/23/2022   HCT 34.9 (L) 07/23/2022   MCV 80.4 07/23/2022   PLT 429 (H) 07/23/2022    ASSESSMENT: 1) Induction of labor due to elevated BMI,  progressing well on pitocin     Coping: well     Membranes: intact  Active Problems:   Labor and delivery, indication for care   PLAN: Continue present management Consider AROM when appropriate Anticipate NSVD  07/25/2022, CNM 07/23/2022 2:40 PM

## 2022-07-23 NOTE — Anesthesia Procedure Notes (Addendum)
Epidural Patient location during procedure: OB Start time: 07/23/2022 2:05 PM End time: 07/23/2022 2:10 PM  Staffing Anesthesiologist: Stephanie Coup, MD Resident/CRNA: Lynden Oxford, CRNA Performed: resident/CRNA   Preanesthetic Checklist Completed: patient identified, IV checked, site marked, risks and benefits discussed, surgical consent, monitors and equipment checked, pre-op evaluation and timeout performed  Epidural Patient position: sitting Prep: ChloraPrep Patient monitoring: heart rate, continuous pulse ox and blood pressure Approach: midline Location: L3-L4 Injection technique: LOR saline  Needle:  Needle type: Tuohy  Needle gauge: 17 G Needle length: 9 cm and 9 Needle insertion depth: 7.5 cm Catheter type: closed end flexible Catheter size: 19 Gauge Catheter at skin depth: 13 cm Test dose: negative and 1.5% lidocaine with Epi 1:200 K  Assessment Sensory level: T10 Events: blood not aspirated, injection not painful, no injection resistance, no paresthesia and negative IV test  Additional Notes 1 attempt Pt. Evaluated and documentation done after procedure finished. Patient identified. Risks/Benefits/Options discussed with patient including but not limited to bleeding, infection, nerve damage, paralysis, failed block, incomplete pain control, headache, blood pressure changes, nausea, vomiting, reactions to medication both or allergic, itching and postpartum back pain. Confirmed with bedside nurse the patient's most recent platelet count. Confirmed with patient that they are not currently taking any anticoagulation, have any bleeding history or any family history of bleeding disorders. Patient expressed understanding and wished to proceed. All questions were answered. Sterile technique was used throughout the entire procedure. Please see nursing notes for vital signs. Test dose was given through epidural catheter and negative prior to continuing to dose epidural or  start infusion. Warning signs of high block given to the patient including shortness of breath, tingling/numbness in hands, complete motor block, or any concerning symptoms with instructions to call for help. Patient was given instructions on fall risk and not to get out of bed. All questions and concerns addressed with instructions to call with any issues or inadequate analgesia.    Patient tolerated the insertion well without immediate complications.Reason for block:procedure for pain

## 2022-07-23 NOTE — H&P (Signed)
History and Physical   HPI  Shannon [name] is a 31 y.o. [id number] at [redacted]w[redacted]d Estimated Date of Delivery: 07/27/22 who is being admitted for induction of labor for BMI>40. She denies LOF and vaginal bleeding. She endorses good fetal movement.   OB History  OB History  Gravida Para Term Preterm AB Living  4 2 2  0 1 2  SAB IAB Ectopic Multiple Live Births  1 0 0 0 2    # Outcome Date GA Lbr Len/2nd Weight Sex Delivery Anes PTL Lv  4 Current           3 Term 07/26/19 [redacted]w[redacted]d / 00:33 3720 g M Vag-Spont EPI  LIV     Name: [name], [name]     Apgar1: 8  Apgar5: 9  2 SAB 2019          1 Term 05/21/16 [redacted]w[redacted]d 302:24 / 01:01 3590 g F Vag-Spont EPI  LIV     Name: [redacted]w[redacted]d     Apgar1: 8  Apgar5: 9    PROBLEM LIST  Pregnancy complications or risks: Patient Active Problem List   Diagnosis Date Noted   Labor and delivery, indication for care 07/16/2022   Acute viral bronchitis 06/30/2019   Tachypnea 06/30/2019   Prediabetes 05/10/2019   Antenatal screening for HIV declined 05/10/2019   Abnormal biochemical finding on antenatal screening of mother    History of miscarriage 01/15/2019   Contusion of left hand 03/07/2018   Major depression 07/27/2017   Pelvic pain 07/27/2017   Obesity (BMI 30.0-34.9) 07/27/2017   BMI 33.0-33.9,adult 05/20/2016   Migraine without aura and without status migrainosus, not intractable 09/26/2014    Prenatal labs and studies: ABO, Rh: --/--/O POS (07/28 10-22-1978) Antibody: NEG (07/28 0552) Rubella: 5.04 (12/30 1551) RPR: Non Reactive (05/11 1109)  HBsAg: Negative (12/30 1551)  HIV:    10-30-2002-- (07/06 1203)   Past Medical History:  Diagnosis Date   Anxiety    Depression    Migraines    Post partum depression      Past Surgical History:  Procedure Laterality Date   CHOLECYSTECTOMY     FOOT SURGERY Left    KNEE SURGERY Left      Medications    Current Discharge Medication List     CONTINUE these medications which have NOT  CHANGED   Details  aspirin EC 81 MG tablet Take 1 tablet (81 mg total) by mouth daily. Swallow whole. Start 14-[redacted] weeks pregnant Qty: 30 tablet, Refills: 11    Prenatal Vit-Fe Fumarate-FA (MULTIVITAMIN-PRENATAL) 27-0.8 MG TABS tablet Take 1 tablet by mouth daily at 12 noon.         Allergies  Robitussin (alcohol free) [guaifenesin]  Review of Systems  Pertinent items noted in HPI and remainder of comprehensive ROS otherwise negative.  Physical Exam  BP 123/71   Pulse 91   Temp 98.4 F (36.9 C) (Oral)   Resp 16   Ht 5\' 3"  (1.6 m)   Wt 103 kg   LMP 10/09/2021 (Exact Date)   BMI 40.22 kg/m   Lungs:  CTAB Cardio: RRR without M/R/G Abd: Soft, gravid, NT Presentation: cephalic  CERVIX: Dilation: 2.5 Effacement (%): 50 Cervical Position: Posterior Station: -3 Presentation: Vertex Exam by:: 10/11/2021, RN  See Prenatal records for more detailed PE.  FHR:  Baseline: 150 Variability: moderate Accelerations: present Decelerations: absent Toco: irregular cramping Category 1  Test Results  Results for orders placed or performed during the hospital encounter of 07/23/22 (  from the past 24 hour(s))  CBC     Status: Abnormal   Collection Time: 07/23/22  5:52 AM  Result Value Ref Range   WBC 10.0 4.0 - 10.5 K/uL   RBC 4.34 3.87 - 5.11 MIL/uL   Hemoglobin 11.2 (L) 12.0 - 15.0 g/dL   HCT 97.3 (L) 53.2 - 99.2 %   MCV 80.4 80.0 - 100.0 fL   MCH 25.8 (L) 26.0 - 34.0 pg   MCHC 32.1 30.0 - 36.0 g/dL   RDW 42.6 83.4 - 19.6 %   Platelets 429 (H) 150 - 400 K/uL   nRBC 0.0 0.0 - 0.2 %  Type and screen     Status: None   Collection Time: 07/23/22  5:52 AM  Result Value Ref Range   ABO/RH(D) O POS    Antibody Screen NEG    Sample Expiration      07/26/2022,2359 Performed at [facility], [address] [place], [place] [address]    Group B Strep positive  Assessment   [id number] at [redacted]w[redacted]d Estimated Date of Delivery: 07/27/22  Reassuring maternal/fetal  status.  Patient Active Problem List   Diagnosis Date Noted   Labor and delivery, indication for care 07/16/2022   Acute viral bronchitis 06/30/2019   Tachypnea 06/30/2019   Prediabetes 05/10/2019   Antenatal screening for HIV declined 05/10/2019   Abnormal biochemical finding on antenatal screening of mother    History of miscarriage 01/15/2019   Contusion of left hand 03/07/2018   Major depression 07/27/2017   Pelvic pain 07/27/2017   Obesity (BMI 30.0-34.9) 07/27/2017   BMI 33.0-33.9,adult 05/20/2016   Migraine without aura and without status migrainosus, not intractable 09/26/2014    Plan  1. Admit to L&D for cervical ripening and induction of labor 2. EFM: Category 1 3. Pharmacologic pain relief if desired.   4. Admission labs  5. Anticipate NSVD 6. MD notified of admission  [name] Spanish, [facility] 07/23/2022 8:55 AM

## 2022-07-23 NOTE — Discharge Summary (Signed)
Postpartum Discharge Summary  Date of Service updated***     Patient Name: Shannon [name] DOB: 1991/03/12 MRN: [id number]  Date of admission: 07/23/2022 Delivery date:07/23/2022  Delivering provider: Lurlean Horns  Date of discharge: 07/23/2022  Admitting diagnosis: Labor and delivery, indication for care [O75.9] Intrauterine pregnancy: [redacted]w[redacted]d    Secondary diagnosis:  Active Problems:   Labor and delivery, indication for care  Additional problems: None    Discharge diagnosis: Term Pregnancy Delivered                                              Post partum procedures:{Postpartum procedures:23558} Augmentation: AROM, Pitocin, and Cytotec Complications: None  Hospital course: Induction of Labor With Vaginal Delivery   31y.o. yo [id number] 351w3das admitted to the hospital 07/23/2022 for induction of labor.  Indication for induction:  BMI>40 .  Patient had an uncomplicated labor course as follows: Membrane Rupture Time/Date: 4:55 PM ,07/23/2022   Delivery Method:Vaginal, Spontaneous  Episiotomy: None  Lacerations:  None  Details of delivery can be found in separate delivery note.  Patient had a routine postpartum course. Patient is discharged home 07/23/22.  Newborn Data: Birth date:07/23/2022  Birth time:6:17 PM  Gender:Female  Living status:Living  Apgars: ,  Weight:   Magnesium Sulfate received: No BMZ received: No Rhophylac:N/A MMR:N/A T-DaP:Given prenatally Flu: N/A Transfusion:{Transfusion received:30440034}  Physical exam  Vitals:   07/23/22 1506 07/23/22 1637 07/23/22 1657 07/23/22 1838  BP: (!) 118/92 126/66  140/87  Pulse: 79 74  87  Resp:    16  Temp:   98.2 F (36.8 C) 98.2 F (36.8 C)  TempSrc:   Oral Oral  SpO2:  98%    Weight:      Height:       General: {Exam; general:21111117} Lochia: {Desc; appropriate/inappropriate:30686::"appropriate"} Uterine Fundus: {Desc; firm/soft:30687} Incision: {Exam; incision:21111123} DVT Evaluation:  {Exam; dvt:2111122} Labs: Lab Results  Component Value Date   WBC 10.0 07/23/2022   HGB 11.2 (L) 07/23/2022   HCT 34.9 (L) 07/23/2022   MCV 80.4 07/23/2022   PLT 429 (H) 07/23/2022      Latest Ref Rng & Units 04/29/2021    3:17 PM  CMP  Glucose 65 - 99 mg/dL 93   BUN 6 - 20 mg/dL 9   Creatinine 0.57 - 1.00 mg/dL 0.72   Sodium 134 - 144 mmol/L 137   Potassium 3.5 - 5.2 mmol/L 4.2   Chloride 96 - 106 mmol/L 99   CO2 20 - 29 mmol/L 24   Calcium 8.7 - 10.2 mg/dL 9.5   Total Protein 6.0 - 8.5 g/dL 7.3   Total Bilirubin 0.0 - 1.2 mg/dL 0.5   Alkaline Phos 44 - 121 IU/L 143   AST 0 - 40 IU/L 27   ALT 0 - 32 IU/L 32    Edinburgh Score:    07/27/2019   10:45 AM  Edinburgh Postnatal Depression Scale Screening Tool  I have been able to laugh and see the funny side of things. 1  I have looked forward with enjoyment to things. 0  I have blamed myself unnecessarily when things went wrong. 1  I have been anxious or worried for no good reason. 3  I have felt scared or panicky for no good reason. 3  Things have been getting on top of me. 1  I have been so unhappy that I have had difficulty sleeping. 0  I have felt sad or miserable. 1  I have been so unhappy that I have been crying. 1  The thought of harming myself has occurred to me. 0  Edinburgh Postnatal Depression Scale Total 11      After visit meds:  Allergies as of 07/23/2022       Reactions   Robitussin (alcohol Free) [guaifenesin] Itching   Took Benadryl, helped     Med Rec must be completed prior to using this [facility]***        Discharge home in stable condition Infant Feeding: {Baby feeding:23562} Infant Disposition:{CHL IP OB HOME WITH KJZPHX:50569} Discharge instruction: per After Visit Summary and Postpartum booklet. Activity: Advance as tolerated. Pelvic rest for 6 weeks.  Diet: {OB diet:21111121} Anticipated Birth Control: {Birth Control:23956} Postpartum Appointment:{Outpatient follow  up:23559} Additional Postpartum F/U: {PP Procedure:23957} Future Appointments: Future Appointments  Date Time Provider [place]  08/05/2022  3:45 PM Philip Aspen, CNM [facility] None  09/01/2022 10:45 AM Norberto Sorenson, Ian Bushman, CNM [facility] None   Follow up Visit:      Date Signature

## 2022-07-24 LAB — CBC
HCT: 31.6 % — ABNORMAL LOW (ref 36.0–46.0)
Hemoglobin: 10 g/dL — ABNORMAL LOW (ref 12.0–15.0)
MCH: 25.2 pg — ABNORMAL LOW (ref 26.0–34.0)
MCHC: 31.6 g/dL (ref 30.0–36.0)
MCV: 79.6 fL — ABNORMAL LOW (ref 80.0–100.0)
Platelets: 355 10*3/uL (ref 150–400)
RBC: 3.97 MIL/uL (ref 3.87–5.11)
RDW: 13.8 % (ref 11.5–15.5)
WBC: 11 10*3/uL — ABNORMAL HIGH (ref 4.0–10.5)
nRBC: 0 % (ref 0.0–0.2)

## 2022-07-24 LAB — RPR: RPR Ser Ql: NONREACTIVE

## 2022-07-24 MED ORDER — IBUPROFEN 600 MG PO TABS: 600.0000 mg | ORAL_TABLET | Freq: Four times a day (QID) | ORAL | 0 refills | Status: DC

## 2022-07-24 NOTE — Anesthesia Post-op Follow-up Note (Signed)
  Anesthesia Pain Follow-up Note  Patient: Shannon [name]  Day #: 1  Date of Follow-up: 07/24/2022 Time: 9:52 AM  Last Vitals:  Vitals:   07/24/22 0350 07/24/22 0740  BP: 126/75 100/77  Pulse: 90 72  Resp: 20 18  Temp: 36.9 C 36.9 C  SpO2: 98% 99%    Level of Consciousness: alert  Pain: none   Side Effects:None  Catheter Site Exam:clean, dry     Plan: D/C from anesthesia care at surgeon's request  Lenard Simmer

## 2022-07-24 NOTE — Anesthesia Postprocedure Evaluation (Signed)
Anesthesia Post Note  Patient: Shannon [name]  Procedure(s) Performed: AN AD HOC LABOR EPIDURAL  Patient location during evaluation: Mother Baby Anesthesia Type: Epidural Level of consciousness: awake and alert Pain management: pain level controlled Vital Signs Assessment: post-procedure vital signs reviewed and stable Respiratory status: spontaneous breathing, nonlabored ventilation and respiratory function stable Cardiovascular status: stable Postop Assessment: no headache, no backache, able to ambulate, no apparent nausea or vomiting and adequate PO intake Anesthetic complications: no   No notable events documented.   Last Vitals:  Vitals:   07/24/22 0350 07/24/22 0740  BP: 126/75 100/77  Pulse: 90 72  Resp: 20 18  Temp: 36.9 C 36.9 C  SpO2: 98% 99%    Last Pain:  Vitals:   07/24/22 0800  TempSrc:   PainSc: 0-No pain                 Lenard Simmer

## 2022-07-25 NOTE — Final Progress Note (Cosign Needed Addendum)
Final Progress Note  Pt's infant needed to remain inpatient for phototherapy therefore pt remained inpatient for another night.  Will be discharged today and then room in with infant.   Patient Name: Shannon [name] DOB: [birth year] MRN: [id number]   Date of admission: 07/23/2022 Delivery date:07/23/2022  Delivering provider: Lurlean Horns  Date of discharge: 07/25/2022   Admitting diagnosis: Labor and delivery, indication for care [O75.9] Intrauterine pregnancy: [redacted]w[redacted]d    Secondary diagnosis:  Active Problems:   Labor and delivery, indication for care   Postpartum care following vaginal delivery   Additional problems: None                           Discharge diagnosis: Term Pregnancy Delivered                                              Post partum procedures: no Augmentation: AROM, Pitocin, and Cytotec Complications: None   Hospital course: Induction of Labor With Vaginal Delivery   31y.o. yo [id number] 371w3das admitted to the hospital 07/23/2022 for induction of labor.  Indication for induction:  BMI>40 .  Patient had an uncomplicated labor course as follows: Membrane Rupture Time/Date: 4:55 PM ,07/23/2022   Delivery Method:Vaginal, Spontaneous  Episiotomy: None  Lacerations:  None  Details of delivery can be found in separate delivery note.  Patient had a routine postpartum course. Breastfeeding going well, infant cluster feeding. Currently pumping. Reports her breasts are starting to feel heavier.  Reports mood is fine, has had difficulty with mood after the birth of both her other children.  Patient is discharged home 07/25/22.   Newborn Data: Birth date:07/23/2022  Birth time:6:17 PM  Gender:Female  Living status:Living  Apgars:8 ,9  Weight:4440 g    Magnesium Sulfate received: No BMZ received: No Rhophylac:N/A MMR:N/A T-DaP:Given prenatally Flu: N/A Transfusion:No   Physical exam  BP 106/67 (BP Location: Left Arm)   Pulse 77   Temp 97.8 F (36.6 C) (Oral)    Resp 16   Ht 5' 3"  (1.6 m)   Wt 103 kg   LMP 10/09/2021 (Exact Date)   SpO2 100%   BMI 40.22 kg/m   GEN: NAD Breasts: currently pumping, nipples appear intact  LUNGS: breathing without difficulty Fundus: FM u/3 Perineum: exam deferred as pt was sitting up pumping and has not concerns Extremities: +1 BLE    Discharge home in stable condition Infant Feeding: Breast Infant Disposition: remain in patient for phototherapy  Discharge instruction: per After Visit Summary and Postpartum booklet. Activity: Advance as tolerated. Pelvic rest for 6 weeks.  Diet: routine diet Anticipated Birth Control:  Undecided. She may opt for Nexplanon Postpartum Appointment:2 weeks and 6 weeks postpartum Additional Postpartum F/U: Postpartum Depression checkup, Pt has Zoloft at home, discussed starting it now or waiting, pt will discuss further at her 2 wk visit Future Appointments:       Future Appointments  Date Time Provider [place]8/09/2022  3:45 PM ThPhilip AspenCNM [facility] None  09/01/2022 10:45 AM SwNorberto SorensonMeIan BushmanCNM [facility] None    Follow up Visit:   Follow-up Information       [facility] up in 2 week(s).   Why: You will have a video appointment in 2 weeks, and then schedule a 6 week postpa  delivery physical. If you opt for the Nexplanon, then let the office know, and that will be done at the 6 week postpartum visit. Contact information: [place]  [address], CNM  Mosetta Pigeon, [facility]  @TODAY @  10:22 AM

## 2022-07-25 NOTE — Progress Notes (Signed)
Discharge instructions, when to follow up, and prescriptions reviewed with patient. Encouraged patient to pick up medications at pharmacy before they close today so she will have meds for her overnight stay.  Patient verbalized understanding.  Patient will stay in room to room-in with infant who needs to stay inpatient for phototherapy. Parents aware that a ID banded patient needs to stay with baby while he is a patient.

## 2022-07-25 NOTE — Discharge Instructions (Signed)

## 2022-07-27 ENCOUNTER — Ambulatory Visit: Payer: Self-pay

## 2022-07-27 NOTE — Lactation Note (Signed)
This note was copied from a baby's chart. Lactation Consultation Note  Patient Name: Shannon [name] Date: 07/27/2022 Reason for consult: Initial assessment,term,mother's request, (other): inconsistent breastfeeding Age:31  Maternal Data This is mom's 3rd baby, NSVD. Mom is an experienced breastfeeding mother. Mom has a history of migraines, depression, PPD, and anxiety. Mom has been breastfeeding, supplementing(approx 30 ml's) baby by slow flow bottle and pumping as baby was inconsistently breastfeeding. Baby was receiving phototherapy, it has been discontinued. Mom's milk has transitioned in, recent pumping she expressed 65 ml's and stopped as the bottles were full. Mom requested LC assistance as she was concerned with baby's shallow latch.  Does the patient have breastfeeding experience prior to this delivery?: Yes How long did the patient breastfeed?: Mom breastfed her first child for 3-4 months and her 2nd child for 2 weeks. With her 2nd child she stopped breastfeeding at 2 weeks as she had post partum depression and was experiencing low milk supply.  Feeding Mother's Current Feeding Choice: Breast Milk  LATCH Score Latch: Grasps breast easily, tongue down, lips flanged, rhythmical sucking.  Audible Swallowing: Spontaneous and intermittent  Type of Nipple: Everted at rest and after stimulation  Comfort (Breast/Nipple): Soft / non-tender  Hold (Positioning): No assistance needed to correctly position infant at breast.  LATCH Score: 10   Lactation Tools Discussed/Used Pump Education: Milk Storage Reason for Pumping: Baby had not been consistently breastfeeding. Pumping frequency: mom will pump if baby does not latch and feed or complete a feed at the breast. Pumped volume: 65 mL (Mom's milk has transitioned in.)  Interventions Interventions: Breast feeding basics reviewed;Adjust position;Support pillows;Education, 8-12 feeds/24 hours: plan includes  supplementing after breastfeeding if baby does not complete a feed at the breast or is not settled after completing a breastfeed, pumping to empty if baby does not latch and breastfeed or complete a breastfeeding  Discharge Discharge Education: Engorgement and breast care;Warning signs for feeding baby (Mom will bring baby for follow-up care to [facility]. Mom now aware of inpatient LC available at the practice.) Pump: Personal (Mom has electric pump for personal use at home.)  Consult Status Consult Status: PRN  Update provided to care nurse.  Fuller Song 07/27/2022, 12:47 PM

## 2022-07-28 ENCOUNTER — Ambulatory Visit: Payer: Self-pay

## 2022-07-28 NOTE — Lactation Note (Signed)
This note was copied from a baby's chart. Lactation Consultation Note  Patient Name: Trenee Igoe PPJKD'[id number] Date: 07/28/2022 Reason for consult: Follow-up assessment;Term;Hyperbilirubinemia Age:31 days  Maternal Data See initial consult note 07/27/22 Today mom with some nipple tenderness, left nipple more than the right.  Has patient been taught Hand Expression?: Yes Does the patient have breastfeeding experience prior to this delivery?: Yes How long did the patient breastfeed?: Mom breastfed her first child for 3-4 months and her 2nd child for 2 weeks.  Feeding Mother's Current Feeding Choice: Breast Milk Observed breastfeeding. Provided tips and strategies to maximize latch and position.  LATCH Score Latch: Grasps breast easily, tongue down, lips flanged, rhythmical sucking.  Audible Swallowing: Spontaneous and intermittent  Type of Nipple: Everted at rest and after stimulation  Comfort (Breast/Nipple): Filling, red/small blisters or bruises, mild/mod discomfort (Mom with some nipple tenderness, more of the left nipple. Nipples intact with slight redness of the left nipple. Mom prefers lanolin at this time. Recommended mom apply to tip of nipple after breastfeeding until nipple no longer has discomfort.)  Hold (Positioning): No assistance needed to correctly position infant at breast.  LATCH Score: 9   Interventions Interventions: Breast feeding basics reviewed;Support pillows;Education;Hand express (lanolin)  Discharge Discharge Education: Engorgement and breast care;Warning signs for feeding baby;Other (comment) (Mom will bring baby for follow-up care to [facility]. Mom now aware of inpatient LC available at the practice.) Pump: Personal (Mom has electric pump for personal use at home.) [facility] Program: No  Consult Status Consult Status: Complete  Update provided to care nurse.  Fuller Song 07/28/2022, 12:41 PM

## 2022-08-05 ENCOUNTER — Encounter: Payer: Self-pay | Admitting: Certified Nurse Midwife

## 2022-08-05 ENCOUNTER — Telehealth: Payer: Managed Care, Other (non HMO) | Admitting: Certified Nurse Midwife

## 2022-08-05 DIAGNOSIS — Z1389 Encounter for screening for other disorder: Secondary | ICD-10-CM | POA: Diagnosis not present

## 2022-08-05 DIAGNOSIS — Z1331 Encounter for screening for depression: Secondary | ICD-10-CM

## 2022-08-05 NOTE — Progress Notes (Signed)
Virtual Visit via Video Note  I connected with Shannon [name] on 08/05/22 at  3:45 PM EDT by a video enabled telemedicine application and verified that I am speaking with the correct person using two identifiers.  Location: Patient: at home Provider: at the office   I discussed the limitations of evaluation and management by telemedicine and the availability of in person appointments. The patient expressed understanding and agreed to proceed.  History of Present Illness: Status post SVD x 2 wks,    Observations/Objective: Doing well over all, bleeding has declined, she denies any pain, Nursing is going ok. Feels like her mild production has slowed some. She is still having some numbness in her hands for carpel tunnel syndrome. Her mood has been good and she denies concern for postpartum depression.   Assessment and Plan: Discussed use of Fenugreek for milk supply, reviewed importance of proper nutrition and hydration. Discussed frequent stimulation ( nursing or pumping). She is scheduling an appointment with lactation consultant at [facility]. Reviewed carpel tunnel and continued hand exercises, discussed hydration , and reassured that it can take a few weeks for symptoms to resolve postpartum.   EDPS: 2   Follow Up Instructions: As scheduled in 4 wks for PPV.    I discussed the assessment and treatment plan with the patient. The patient was provided an opportunity to ask questions and all were answered. The patient agreed with the plan and demonstrated an understanding of the instructions.   The patient was advised to call back or seek an in-person evaluation if the symptoms worsen or if the condition fails to improve as anticipated.  I provided 10 minutes of non-face-to-face time during this encounter.   Doreene Burke, CNM

## 2022-09-01 ENCOUNTER — Ambulatory Visit (INDEPENDENT_AMBULATORY_CARE_PROVIDER_SITE_OTHER): Payer: Managed Care, Other (non HMO) | Admitting: Obstetrics

## 2022-09-01 ENCOUNTER — Encounter: Payer: Self-pay | Admitting: Obstetrics

## 2022-09-01 DIAGNOSIS — F53 Postpartum depression: Secondary | ICD-10-CM

## 2022-09-01 DIAGNOSIS — N3945 Continuous leakage: Secondary | ICD-10-CM

## 2022-09-01 DIAGNOSIS — G5603 Carpal tunnel syndrome, bilateral upper limbs: Secondary | ICD-10-CM

## 2022-09-01 MED ORDER — OXYBUTYNIN CHLORIDE ER 5 MG PO TB24: 5.0000 mg | ORAL_TABLET | Freq: Every day | ORAL | 0 refills | Status: DC

## 2022-09-01 MED ORDER — SERTRALINE HCL 50 MG PO TABS: 50.0000 mg | ORAL_TABLET | Freq: Every day | ORAL | 6 refills | Status: DC

## 2022-09-01 NOTE — Progress Notes (Signed)
Post Partum Visit Note  Shannon [name] is a 31 y.o. [phone] female who presents for a postpartum visit. She is 5 weeks postpartum following a normal spontaneous vaginal delivery.  I attended her birth and have fully reviewed the prenatal and intrapartum course. The delivery was at 39+3 gestational weeks.  Anesthesia: epidural. Postpartum course has been complicated by incontinence, depression, and lingering carpal tunnel syndrome. Baby is doing well. Baby is feeding by breast and bottle. Bleeding: no bleeding. Bowel function is normal. Bladder function is abnormal: she is leaking urine without being aware of it and feels she is not adequately emptying her bladder . Patient is sexually active. Contraception method is  none currently but plans Nexplanon/vasectomy . Postpartum depression screening: positive. She is still having tingling and numbness in her fingers and is sometimes unable to perform fine motor tasks.    Upstream - 09/01/22 1203       Pregnancy Intention Screening   Does the patient want to become pregnant in the next year? No    Does the patient's partner want to become pregnant in the next year? No    Would the patient like to discuss contraceptive options today? Yes      Contraception Wrap Up   Current Method No Contraceptive Precautions    End Method Hormonal Implant    Contraception Counseling Provided Yes            The pregnancy intention screening data noted above was reviewed. Potential methods of contraception were discussed. The patient elected to proceed with Hormonal Implant and will have this placed at a future visit.   Edinburgh Postnatal Depression Scale - 09/01/22 1052       Edinburgh Postnatal Depression Scale:  In the Past 7 Days   I have been able to laugh and see the funny side of things. 0    I have looked forward with enjoyment to things. 0    I have blamed myself unnecessarily when things went wrong. 1    I have been anxious or worried for no  good reason. 2    I have felt scared or panicky for no good reason. 2    Things have been getting on top of me. 2    I have been so unhappy that I have had difficulty sleeping. 1    I have felt sad or miserable. 2    I have been so unhappy that I have been crying. 1    The thought of harming myself has occurred to me. 1    Edinburgh Postnatal Depression Scale Total 12             Health Maintenance Due  Topic Date Due   COVID-19 Vaccine (1) Never done   INFLUENZA VACCINE  07/27/2022    The following portions of the patient's history were reviewed and updated as appropriate: allergies, current medications, past family history, past medical history, past social history, past surgical history, and problem list.  Review of Systems Pertinent items noted in HPI and remainder of comprehensive ROS otherwise negative.  Objective:  BP 104/69   Pulse 71   Ht 5\' 6"  (1.676 m)   Wt 212 lb 1.6 oz (96.2 kg)   BMI 34.23 kg/m    General:  alert, cooperative, and appears stated age   Breasts:  not indicated  Lungs: clear to auscultation bilaterally  Heart:  regular rate and rhythm, S1, S2 normal, no murmur, click, rub or gallop  Abdomen:  soft, non-tender; bowel sounds normal; no masses,  no organomegaly and fundus palpable at [facility]    Wound : N/A  GU exam:   Normal external genitalia, uterus feels appropriate size, good vaginal tone, no bulging or urine leakage noted with cough or Valsalva       Assessment:    There are no diagnoses linked to this encounter.  5-weeks postpartum exam.   Plan:   Essential components of care per ACOG recommendations:  1.  Mood and well being: Patient with positive depression screening today. Answered "hardly ever" to EPDS question 10. Chelci states that she is not actively suicidal but had passing thoughts that she would like to die. She states that when she feels that way, she verbalizes it to her husband and he helps her feel better. "I would never act on  those thoughts and I don't want to harm myself."  She states that if she ever felt suicidal, she would speak to her husband and go to the ED. She would like to start on  Zoloft again (was on prior to pregnancy). Reviewed proper dosage and warning signs. Will follow up on mood. - Patient tobacco use? No.   - hx of drug use? No.    2. Infant care and feeding:  -Patient currently breastmilk feeding? Yes. Discussed returning to work and pumping.  -Social determinants of health (SDOH) reviewed in EPIC. No concerns.  3. Sexuality, contraception and birth spacing - Patient does not want a pregnancy in the next year.  Desired family size is 3 children. Family is complete and she strongly does not want another pregnancy. - Reviewed reproductive life planning. Reviewed contraceptive methods based on pt preferences and effectiveness.  Patient desired Hormonal Implant and will schedule placement.     4. Sleep and fatigue -Encouraged family/partner/community support of 4 hrs of uninterrupted sleep to help with mood and fatigue  5. Physical Recovery  - Discussed patients delivery and complications. She describes her labor as good. - Patient had a Vaginal, no problems at delivery. Patient had  no  laceration. Perineal healing reviewed. Patient expressed understanding - Patient has urinary incontinence? Yes. Discussed role of pelvic floor PT. Consulted with Dr. Logan Bores. Will start on ditropan XL 5 mg due to the nature of her incontinence. Will check post-void residual at next visit.  - Patient is safe to resume physical and sexual activity - Carpal tunnel: recommend wrist splints and exercises. She will need a note for work if symptoms do not improve. Consider orthopedics referral. 6.  Health Maintenance - HM due items addressed Yes - Last pap smear  Diagnosis  Date Value Ref Range Status  04/29/2021      - Negative for Intraepithelial Lesions or Malignancy (NILM)  04/29/2021 - Benign reactive/reparative  changes     Pap smear not done at today's visit.  -Breast Cancer screening indicated? No.   7. Chronic Disease/Pregnancy Condition follow up: None  - PCP follow up  Glenetta Borg, CNM [facility]

## 2022-09-21 ENCOUNTER — Encounter: Payer: Self-pay | Admitting: Obstetrics

## 2022-09-21 ENCOUNTER — Encounter: Payer: Managed Care, Other (non HMO) | Admitting: Obstetrics

## 2022-09-21 ENCOUNTER — Ambulatory Visit (INDEPENDENT_AMBULATORY_CARE_PROVIDER_SITE_OTHER): Payer: Managed Care, Other (non HMO) | Admitting: Obstetrics

## 2022-09-21 VITALS — BP 113/66 | HR 72 | Ht 66.0 in | Wt 215.0 lb

## 2022-09-21 DIAGNOSIS — Z3042 Encounter for surveillance of injectable contraceptive: Secondary | ICD-10-CM

## 2022-09-21 DIAGNOSIS — G8929 Other chronic pain: Secondary | ICD-10-CM

## 2022-09-21 DIAGNOSIS — Z3202 Encounter for pregnancy test, result negative: Secondary | ICD-10-CM

## 2022-09-21 LAB — POCT URINE PREGNANCY: Preg Test, Ur: NEGATIVE

## 2022-09-21 MED ORDER — ESCITALOPRAM OXALATE 10 MG PO TABS: 10.0000 mg | ORAL_TABLET | Freq: Every day | ORAL | 3 refills | Status: DC

## 2022-09-21 NOTE — Progress Notes (Signed)
[place] INSERTION  SUBJECTIVE Shannon [name] is a 31 y.o. [id number] who presents today for insertion of Nexplanon contraceptive device. She desires reversible long-term contraception. We have thoroughly reviewed the risks, benefits, and alternatives, and she has elected to proceed with Nexplanon insertion.  [name]'s urinary incontinence symptoms have almost completely resolved and she declines post-void residual today.  She was unable to tolerate the Zoloft due to extreme sleepiness. She reports occasional sudden-onset panic attacks. Her carpal tunnel symptoms have resolved in her left, wrist, but her right wrist continues to be problematic.  OBJECTIVE BP 113/66   Pulse 72   Ht 5\' 6"  (1.676 m)   Wt 215 lb (97.5 kg)   Breastfeeding Yes   BMI 34.70 kg/m   UPT: Negative  Procedure Note Left Arm Sterile Preparation:  Betadine   Insertion site was selected 10 cm from the medial epicondyle and marked using a sterile marker. The procedure area was prepped in sterile fashion. Adequate anesthesia was achieved with 2 mL of 1% lidocaine injected subcutaneously. The Nexplanon applicator was inserted subcutaneously and the Nexplanon device was delivered. The applicator was removed from the insertion site. The capsule was palpated by myself and the patient to confirm satisfactory placement. Blood loss was minimal. A pressure dressing was applied.  Natally tolerated the procedure well with no complications. Standard post-procedure care and return precautions were explained.   Plan 1)  Will start on Lexapro 10 mg. Instructions on proper usage given. Discussed possible PRN clonazepam when she is not longer breastfeeding if still having panic symptoms. Encouraged to call if she not getting relief from Lexapro or if the side effects are not tolerable as there are other options. 2) Referral to orthopedics for further evaluation of wrist pain. 3) Schedule annual visit  Lloyd Huger, CNM

## 2022-09-22 ENCOUNTER — Encounter: Payer: Self-pay | Admitting: Sports Medicine

## 2022-09-22 ENCOUNTER — Ambulatory Visit (INDEPENDENT_AMBULATORY_CARE_PROVIDER_SITE_OTHER): Payer: Managed Care, Other (non HMO)

## 2022-09-22 ENCOUNTER — Ambulatory Visit (INDEPENDENT_AMBULATORY_CARE_PROVIDER_SITE_OTHER): Payer: Managed Care, Other (non HMO) | Admitting: Sports Medicine

## 2022-09-22 VITALS — Ht 63.0 in | Wt 215.0 lb

## 2022-09-22 DIAGNOSIS — M25531 Pain in right wrist: Secondary | ICD-10-CM | POA: Diagnosis not present

## 2022-09-22 DIAGNOSIS — G5601 Carpal tunnel syndrome, right upper limb: Secondary | ICD-10-CM | POA: Diagnosis not present

## 2022-09-22 NOTE — Progress Notes (Signed)
Right sided carpal tunnel symptoms Pain and numbness/tingling  She had pain during pregnancy, was told it would subside once she delivered, however pain seems to be getting worse  July 28th-delivery She is currently breastfeeding  Denies use of wrist brace; denies OTC medication

## 2022-09-22 NOTE — Progress Notes (Signed)
Shannon [name] - 31 y.o. female MRN [id number]  Date of birth: September 06, 1991  Office Visit Note: Visit Date: 09/22/2022 PCP: Patient, No Pcp Per Referred by: Shannon [name], CNM  Subjective: Chief Complaint  Patient presents with   Right Wrist - Pain   HPI: Shannon [name] is a pleasant 31 y.o. female who presents today for chronic right wrist pain.  Patient has had numbness and tingling in the right wrist and fingers mostly thumb and index finger since the later stages of her pregnancy.  Occasionally she will get numbness and tingling in the ring and little finger as well.  He had this in both hands during pregnancy, after she had delivered her left wrist got better, but her right wrist numbness and tingling has continued.  Had the birth of her son Shannon [name], on 07/23/2022.  She currently is breast-feeding.  Has not used the wrist brace, physical therapy or over-the-counter medication today.  Difficulty with gripping.  She does work in [employer], but does do a lot of typing at home and for her job.  Pertinent ROS were reviewed with the patient and found to be negative unless otherwise specified above in HPI.   Assessment & Plan: Visit Diagnoses:  1. Carpal tunnel syndrome, right upper limb   2. Pain in right wrist    Plan: Discussed with Shannon [name] that she does have signs and symptoms very indicative of carpal tunnel syndrome.  We did fit her for a cock up wrist brace today that she is to wear all the time at night, and then during the day is much as possible.  Discussed reducing the repeated flexion and extension about the wrist, discussed activity modification holding and feeding her son.  We will start her on ibuprofen 600 mg twice a day for the next 2 weeks scheduled, then as needed.  Discussed R/B/I with breast meaning, this is safe for her to continue.  Discussed other options such as a corticosteroid injection and/or nerve studies if this is not improving, she would like to hold  off on those for now.  She will follow-up with me in 6 weeks.  Meds & Orders: No orders of the defined types were placed in this encounter.   Orders Placed This Encounter  Procedures   XR Wrist 2 Views Right     Procedures: No procedures performed      Clinical History: No specialty comments available.  She reports that she has never smoked. She has never used smokeless tobacco.  Recent Labs    12/25/21 1551  HGBA1C 5.5    Objective:   Vital Signs: Ht 5\' 3"  (1.6 m)   Wt 215 lb (97.5 kg)   BMI 38.09 kg/m   Physical Exam  Gen: Well-appearing, in no acute distress; non-toxic CV: Regular Rate. Well-perfused. Warm.  Resp: Breathing unlabored on room air; no wheezing. Psych: Fluid speech in conversation; appropriate affect; normal thought process Neuro: Sensation intact throughout. No gross coordination deficits.   Ortho Exam - Right wrist: No bony TTP.  Inspection is no erythema, swelling or bony deformity.  Full range of motion about the wrist in flexion extension and radial/ulnar deviation.  Positive Tinel's at the carpal tunnel.  Positive Phalen's test.  Full range of motion at the elbow.  Negative TTP at the cubital tunnel.  Negative Spurling's test of the cervical spine.  5/5 strength of the wrist in flexion and extension.  Neurovascular intact distally.  Imaging: XR Wrist 2 Views  Right  Result Date: 09/22/2022 2 views of the right wrist including AP and lateral ordered and reviewed by myself.  X-rays demonstrate neutral ulnar variance.  No significant arthritic change or bony abnormality noted.  No acute fracture.   Past Medical/Family/Surgical/Social History: Medications & Allergies reviewed per EMR, new medications updated. Patient Active Problem List   Diagnosis Date Noted   Acute viral bronchitis 06/30/2019   Prediabetes 05/10/2019   Major depression 07/27/2017   Obesity (BMI 30.0-34.9) 07/27/2017   Migraine without aura and without status migrainosus, not  intractable 09/26/2014   Past Medical History:  Diagnosis Date   Anxiety    Depression    Migraines    Post partum depression    Family History  Problem Relation Age of Onset   Migraines Mother    Cancer Maternal Grandmother    Cancer Maternal Grandfather    Hypertension Paternal Grandfather    Diabetes Maternal Aunt    Diabetes Maternal Uncle    Diabetes Maternal Aunt    Past Surgical History:  Procedure Laterality Date   CHOLECYSTECTOMY     FOOT SURGERY Left    KNEE SURGERY Left    Social History   Occupational History   Occupation: Radio producer  Tobacco Use   Smoking status: Never   Smokeless tobacco: Never  Vaping Use   Vaping Use: Never used  Substance and Sexual Activity   Alcohol use: Not Currently    Alcohol/week: 4.0 standard drinks of alcohol    Types: 4 Standard drinks or equivalent per week   Drug use: No   Sexual activity: Yes    Birth control/protection: Other-see comments    Comment: Still deciding

## 2022-09-22 NOTE — Patient Instructions (Signed)
Monte - it was great to meet you today. Congrats on [name]!  You have carpal tunnel syndrome. Wear the wrist brace at nighttime and as often as possible during the day. The key is to reduce repeated flexion/extension of the wrist. Ibuprofen 600mg  twice a day with food for pain and inflammation. Let's do this scheduled for the next 2 weeks scheduled, then only as needed  Corticosteroid injection is a consideration to help with pain and inflammation. If not improving, will consider nerve conduction studies to assess severity.  Follow up with me in 6 weeks.  - Dr. Rolena Infante

## 2022-09-24 ENCOUNTER — Other Ambulatory Visit: Payer: Self-pay | Admitting: Obstetrics

## 2022-09-26 ENCOUNTER — Encounter: Payer: Self-pay | Admitting: Obstetrics

## 2022-09-27 ENCOUNTER — Encounter: Payer: Self-pay | Admitting: Obstetrics

## 2022-10-05 ENCOUNTER — Encounter: Payer: Self-pay | Admitting: Obstetrics

## 2022-10-05 ENCOUNTER — Ambulatory Visit (INDEPENDENT_AMBULATORY_CARE_PROVIDER_SITE_OTHER): Payer: Managed Care, Other (non HMO) | Admitting: Obstetrics

## 2022-10-05 VITALS — BP 108/70 | Ht 63.0 in | Wt 213.0 lb

## 2022-10-05 DIAGNOSIS — Z01419 Encounter for gynecological examination (general) (routine) without abnormal findings: Secondary | ICD-10-CM

## 2022-10-05 NOTE — Progress Notes (Signed)
SUBJECTIVE  HPI  Shannon [name] is a 31 y.o.-year-old Shannon [name] who presents for an annual gynecological exam today. She is about 10 weeks postpartum and is recovering well. She plans to start back at work on Monday. She has resumed intercourse with no problems. She denies abnormal vaginal bleeding or discharge, dyspareunia, pelvic pain, and UTI symptoms. Her urinary incontinence symptoms have improved. She had a Nexplanon placed a few weeks ago, and this is working well for her. Her mood and sleep feel much improved after starting Lexapro. She saw ortho for her carpal tunnel symptoms and was given a wrist brace. She has no other health concerns at this time.  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 Not having regular periods yet Last Pap: 04/2021, normal cytology Contraception: Nexplanon  Prevention Mammogram at 40 Colonoscopy at 45  Current Medications Outpatient Medications Prior to Visit  Medication Sig   escitalopram (LEXAPRO) 10 MG tablet Take 10 mg by mouth daily.   etonogestrel (NEXPLANON) 68 MG IMPL implant 1 each by Subdermal route once.   ibuprofen (ADVIL) 600 MG tablet Take 1 tablet (600 mg total) by mouth every 6 (six) hours.   [DISCONTINUED] escitalopram (LEXAPRO) 10  MG tablet Take 1 tablet (10 mg total) by mouth daily. Take half a tab daily for one week, then increase to a full tab   [DISCONTINUED] Prenatal Vit-Fe Fumarate-FA (MULTIVITAMIN-PRENATAL) 27-0.8 MG TABS tablet Take 1 tablet by mouth daily at 12 noon.   [DISCONTINUED] oxybutynin (DITROPAN XL) 5 MG 24 hr tablet Take 1 tablet (5 mg total) by mouth at bedtime.   No facility-administered medications prior to visit.      Upstream - 10/05/22 1314       Pregnancy Intention Screening   Does the patient want to become pregnant in the next year? No    Does the patient's partner want to become pregnant in the next year? No    Would the patient like to discuss contraceptive options today? No      Contraception Wrap Up   Current Method Hormonal Implant    End Method Hormonal Implant    Contraception Counseling Provided No    How was the end contraceptive method provided? --   has Nexplanon in place           The pregnancy intention screening data noted above was reviewed. Potential methods of contraception were discussed. The patient elected to proceed with Hormonal Implant.   ROS History obtained from the patient General ROS: negative for - chills, fatigue,  fever, hot flashes, or night sweats Psychological ROS: negative for - suicidal ideation Ophthalmic ROS: negative for - blurry vision or decreased vision ENT ROS: negative for - headaches, sinus pain, or sore throat Hematological and Lymphatic ROS: negative for - bleeding problems or swollen lymph nodes Endocrine ROS: negative for - breast changes, mood swings, palpitations, or polydipsia/polyuria Breast ROS: negative for breast lumps Respiratory ROS: no cough, shortness of breath, or wheezing Cardiovascular ROS: no chest pain or dyspnea on exertion Gastrointestinal ROS: no abdominal pain, change in bowel habits, or black or bloody stools Genito-Urinary ROS: no dysuria, trouble voiding, or hematuria Musculoskeletal ROS:  negative Dermatological ROS: negative     09/15/2021    9:15 AM 08/20/2021    1:39 PM 05/06/2021   11:26 AM 05/06/2021   10:31 AM 04/29/2021    2:42 PM  Depression screen PHQ 2/9  Decreased Interest 0 0 0 0 1  Down, Depressed, Hopeless 1 0 0 0 0  PHQ - 2 Score 1 0 0 0 1  Altered sleeping 1  3    Tired, decreased energy 1  1    Change in appetite 0  0    Feeling bad or failure about yourself  0  1    Trouble concentrating 0  0    Moving slowly or fidgety/restless 0  0    Suicidal thoughts 1  0    PHQ-9 Score 4  5    Difficult doing work/chores   Not difficult at all       OBJECTIVE  Last Weight  Most recent update: 10/05/2022  9:59 AM    Weight  96.6 kg (213 lb)             Body mass index is 37.73 kg/m.    BP 108/70   Ht 5\' 3"  (1.6 m)   Wt 213 lb (96.6 kg)   LMP 09/06/2022 (Approximate)   BMI 37.73 kg/m  General appearance: alert, cooperative, and appears stated age Head: Normocephalic, without obvious abnormality, atraumatic Eyes: negative findings: lids and lashes normal and conjunctivae and sclerae normal Neck: no adenopathy, supple, symmetrical, trachea midline, and thyroid not enlarged, symmetric, no tenderness/mass/nodules Lungs: clear to auscultation bilaterally Breasts: normal appearance, no masses or tenderness, Inspection negative, No nipple retraction or dimpling, No axillary or supraclavicular adenopathy, Normal to palpation without dominant masses Heart: regular rate and rhythm, S1, S2 normal, no murmur, click, rub or gallop Abdomen: soft, non-tender; bowel sounds normal; no masses,  no organomegaly Pelvic: deferred Extremities: extremities normal, atraumatic, no cyanosis or edema Pulses: 2+ and symmetric Skin: Skin color, texture, turgor normal. No rashes or lesions Lymph nodes: Cervical, supraclavicular, and axillary nodes normal.  ASSESSMENT  1) Annual wellness exam 2) Medication follow up  PLAN 1) Physical exam as noted. Pap due in 2025.  Declines STI testing. Labs: CBC, lipid profile, CMP, A1C. Reviewed preventive care and healthy lifestyle choices. 2) Mood has improved on Lexapro. She feels stable on her current dose.   Return in one year for annual exam or as needed for concerns.   2026, CNM

## 2022-10-06 ENCOUNTER — Encounter: Payer: Self-pay | Admitting: Certified Nurse Midwife

## 2022-10-06 LAB — CBC
Hematocrit: 40 % (ref 34.0–46.6)
Hemoglobin: 13 g/dL (ref 11.1–15.9)
MCH: 26.4 pg — ABNORMAL LOW (ref 26.6–33.0)
MCHC: 32.5 g/dL (ref 31.5–35.7)
MCV: 81 fL (ref 79–97)
Platelets: 455 10*3/uL — ABNORMAL HIGH (ref 150–450)
RBC: 4.93 x10E6/uL (ref 3.77–5.28)
RDW: 16.3 % — ABNORMAL HIGH (ref 11.7–15.4)
WBC: 8.5 10*3/uL (ref 3.4–10.8)

## 2022-10-06 LAB — LIPID PANEL
Chol/HDL Ratio: 3 ratio (ref 0.0–4.4)
Cholesterol, Total: 152 mg/dL (ref 100–199)
HDL: 51 mg/dL (ref >39)
LDL Chol Calc (NIH): 85 mg/dL (ref 0–99)
Triglycerides: 86 mg/dL (ref 0–149)
VLDL Cholesterol Cal: 16 mg/dL (ref 5–40)

## 2022-10-06 LAB — COMPREHENSIVE METABOLIC PANEL
ALT: 46 IU/L — ABNORMAL HIGH (ref 0–32)
AST: 29 IU/L (ref 0–40)
Albumin/Globulin Ratio: 1.6 (ref 1.2–2.2)
Albumin: 4.7 g/dL (ref 3.9–4.9)
Alkaline Phosphatase: 159 IU/L — ABNORMAL HIGH (ref 44–121)
BUN/Creatinine Ratio: 14 (ref 9–23)
BUN: 10 mg/dL (ref 6–20)
Bilirubin Total: 1 mg/dL (ref 0.0–1.2)
CO2: 21 mmol/L (ref 20–29)
Calcium: 9.5 mg/dL (ref 8.7–10.2)
Chloride: 102 mmol/L (ref 96–106)
Creatinine, Ser: 0.7 mg/dL (ref 0.57–1.00)
Globulin, Total: 3 g/dL (ref 1.5–4.5)
Glucose: 82 mg/dL (ref 70–99)
Potassium: 4.3 mmol/L (ref 3.5–5.2)
Sodium: 138 mmol/L (ref 134–144)
Total Protein: 7.7 g/dL (ref 6.0–8.5)
eGFR: 119 mL/min/{1.73_m2} (ref >59)

## 2022-10-06 LAB — HEMOGLOBIN A1C
Est. average glucose Bld gHb Est-mCnc: 114 mg/dL
Hgb A1c MFr Bld: 5.6 % (ref 4.8–5.6)

## 2022-10-06 LAB — TSH: TSH: 1.6 u[IU]/mL (ref 0.450–4.500)

## 2022-10-07 ENCOUNTER — Encounter: Payer: Self-pay | Admitting: Obstetrics

## 2022-10-09 ENCOUNTER — Encounter: Payer: Self-pay | Admitting: Obstetrics

## 2022-10-12 ENCOUNTER — Other Ambulatory Visit: Payer: Self-pay | Admitting: Obstetrics

## 2022-10-12 DIAGNOSIS — R748 Abnormal levels of other serum enzymes: Secondary | ICD-10-CM

## 2022-10-12 NOTE — Progress Notes (Signed)
mp

## 2022-10-14 ENCOUNTER — Other Ambulatory Visit: Payer: Managed Care, Other (non HMO)

## 2022-10-14 DIAGNOSIS — R748 Abnormal levels of other serum enzymes: Secondary | ICD-10-CM

## 2022-10-15 ENCOUNTER — Other Ambulatory Visit: Payer: Managed Care, Other (non HMO)

## 2022-10-15 ENCOUNTER — Other Ambulatory Visit: Payer: Self-pay | Admitting: Obstetrics

## 2022-10-15 DIAGNOSIS — R748 Abnormal levels of other serum enzymes: Secondary | ICD-10-CM

## 2022-10-15 LAB — CBC WITH DIFFERENTIAL/PLATELET
Basophils Absolute: 0 10*3/uL (ref 0.0–0.2)
Basos: 1 %
EOS (ABSOLUTE): 0 10*3/uL (ref 0.0–0.4)
Eos: 1 %
Hematocrit: 41.9 % (ref 34.0–46.6)
Hemoglobin: 13.4 g/dL (ref 11.1–15.9)
Immature Grans (Abs): 0 10*3/uL (ref 0.0–0.1)
Immature Granulocytes: 0 %
Lymphocytes Absolute: 3 10*3/uL (ref 0.7–3.1)
Lymphs: 42 %
MCH: 26.8 pg (ref 26.6–33.0)
MCHC: 32 g/dL (ref 31.5–35.7)
MCV: 84 fL (ref 79–97)
Monocytes Absolute: 0.4 10*3/uL (ref 0.1–0.9)
Monocytes: 5 %
Neutrophils Absolute: 3.8 10*3/uL (ref 1.4–7.0)
Neutrophils: 51 %
Platelets: 381 10*3/uL (ref 150–450)
RBC: 5 x10E6/uL (ref 3.77–5.28)
RDW: 15.6 % — ABNORMAL HIGH (ref 11.7–15.4)
WBC: 7.3 10*3/uL (ref 3.4–10.8)

## 2022-10-15 LAB — HIV ANTIBODY (ROUTINE TESTING W REFLEX): HIV Screen 4th Generation wRfx: NONREACTIVE

## 2022-10-15 LAB — COMPREHENSIVE METABOLIC PANEL
ALT: 55 IU/L — ABNORMAL HIGH (ref 0–32)
AST: 36 IU/L (ref 0–40)
Albumin/Globulin Ratio: 1.5 (ref 1.2–2.2)
Albumin: 4.8 g/dL (ref 3.9–4.9)
Alkaline Phosphatase: 164 IU/L — ABNORMAL HIGH (ref 44–121)
BUN/Creatinine Ratio: 14 (ref 9–23)
BUN: 10 mg/dL (ref 6–20)
Bilirubin Total: 0.8 mg/dL (ref 0.0–1.2)
CO2: 21 mmol/L (ref 20–29)
Calcium: 9.3 mg/dL (ref 8.7–10.2)
Chloride: 104 mmol/L (ref 96–106)
Creatinine, Ser: 0.73 mg/dL (ref 0.57–1.00)
Globulin, Total: 3.1 g/dL (ref 1.5–4.5)
Glucose: 104 mg/dL — ABNORMAL HIGH (ref 70–99)
Potassium: 4 mmol/L (ref 3.5–5.2)
Sodium: 140 mmol/L (ref 134–144)
Total Protein: 7.9 g/dL (ref 6.0–8.5)
eGFR: 113 mL/min/{1.73_m2} (ref >59)

## 2022-10-16 LAB — HEPATITIS B SURFACE ANTIBODY,QUALITATIVE: Hep B Surface Ab, Qual: REACTIVE

## 2022-10-16 LAB — HEPATITIS B SURFACE ANTIGEN: Hepatitis B Surface Ag: NEGATIVE

## 2022-10-16 LAB — CMV IGM: CMV IgM Ser EIA-aCnc: <30 AU/mL (ref 0.0–29.9)

## 2022-10-16 LAB — HEPATITIS A ANTIBODY, TOTAL: hep A Total Ab: NEGATIVE

## 2022-10-16 LAB — HEPATITIS C ANTIBODY: Hep C Virus Ab: NONREACTIVE

## 2022-10-16 LAB — EPSTEIN-BARR VIRUS NUCLEAR ANTIGEN ANTIBODY, IGG: EBV NA IgG: >600 U/mL — ABNORMAL HIGH (ref 0.0–17.9)

## 2022-10-18 ENCOUNTER — Other Ambulatory Visit: Payer: Self-pay | Admitting: Obstetrics

## 2022-10-22 ENCOUNTER — Telehealth: Payer: Self-pay

## 2022-10-22 NOTE — Telephone Encounter (Signed)
Shannon [name] called triage line requesting for someone to review her lab work and explain to her.

## 2022-10-29 ENCOUNTER — Encounter: Payer: Self-pay | Admitting: Obstetrics

## 2022-10-29 ENCOUNTER — Other Ambulatory Visit: Payer: Self-pay | Admitting: Obstetrics

## 2022-10-29 DIAGNOSIS — R748 Abnormal levels of other serum enzymes: Secondary | ICD-10-CM

## 2023-05-09 ENCOUNTER — Ambulatory Visit (INDEPENDENT_AMBULATORY_CARE_PROVIDER_SITE_OTHER): Payer: Managed Care, Other (non HMO) | Admitting: Obstetrics

## 2023-05-09 ENCOUNTER — Encounter: Payer: Self-pay | Admitting: Obstetrics

## 2023-05-09 ENCOUNTER — Telehealth: Payer: Self-pay

## 2023-05-09 VITALS — BP 104/74 | HR 86 | Ht 63.0 in | Wt 236.0 lb

## 2023-05-09 DIAGNOSIS — Z3046 Encounter for surveillance of implantable subdermal contraceptive: Secondary | ICD-10-CM | POA: Diagnosis not present

## 2023-05-09 DIAGNOSIS — R748 Abnormal levels of other serum enzymes: Secondary | ICD-10-CM

## 2023-05-09 DIAGNOSIS — R635 Abnormal weight gain: Secondary | ICD-10-CM

## 2023-05-09 MED ORDER — SLYND 4 MG PO TABS: 1.0000 | ORAL_TABLET | Freq: Every day | ORAL | 3 refills | Status: DC

## 2023-05-09 MED ORDER — NORETHINDRONE 0.35 MG PO TABS: 1.0000 | ORAL_TABLET | Freq: Every day | ORAL | 3 refills | Status: DC

## 2023-05-09 NOTE — Telephone Encounter (Signed)
Shannon [name] called and states that she wanted me to pass a message along to you that she would like to get her birth control from the [facility]. States that the one you sent her was just way too much. She asked for you to please call her and discuss.

## 2023-05-09 NOTE — Progress Notes (Addendum)
NEXPLANON REMOVAL  SUBJECTIVE Shannon [name] is a 32 y.o. [id number] who presents today for removal of her Nexplanon. Her Nexplanon was placed 09/21/22. She reports that since having this placed, she has gained weight despite regular exercise, and she has had increased irritability the last 2 months. She is no longer taking Lexapro. She reports that she feels hot all the time and has frequent heart palpitations. She has a family history of thyroid disorder. She would like to switch to OCPs. She has a h/o migraine with aura.  OBJECTIVE Vitals:   05/09/23 1430  BP: 104/74  Pulse: 86    Procedure Note Consent was obtained before beginning this procedure. The Nexplanon was palpated and the surrounding skin was prepped with iodine in sterile fashion. Adequate anesthesia was achieved with subdermal injection of 1% lidocaine. A skin incision was made over the distal aspect of the device. The capsule was lysed sharply and the device was removed with a hemostat. Hemostasis was achieved. The incision site was closed with with a steristrip and a pressure dressing was applied. Shizuye tolerated the procedure well.  Assessment 1) Nexplanon removal 2) Desires contraception 3) Unexpected weight gain  Plan  1)  Standard post-procedure care and precautions were reviewed. Evelin verbalized understanding. 2) Rx for POP sent to pharmacy on file. Instructions on proper administration given. 3) TSH, T4, thyroid antibodies drawn today  [name] Spanish, CNM

## 2023-05-09 NOTE — Addendum Note (Signed)
Addended by: [name] Spanish on: 05/09/2023 04:18 PM   Modules accepted: Orders

## 2023-05-14 LAB — TSH+FREE T4
Free T4: 1.31 ng/dL (ref 0.82–1.77)
TSH: 1.39 u[IU]/mL (ref 0.450–4.500)

## 2023-05-14 LAB — SPECIMEN STATUS REPORT

## 2023-05-14 LAB — THYROID ANTIBODIES
Thyroglobulin Antibody: <1 IU/mL (ref 0.0–0.9)
Thyroperoxidase Ab SerPl-aCnc: 18 IU/mL (ref 0–34)

## 2023-05-15 LAB — ALKALINE PHOSPHATASE, ISOENZYMES
Alkaline Phosphatase: 150 IU/L — ABNORMAL HIGH (ref 44–121)
BONE FRACTION: 43 % (ref 14–68)
INTESTINAL FRAC.: 2 % (ref 0–18)
LIVER FRACTION: 55 % (ref 18–85)

## 2023-05-15 LAB — HEPATITIS A ANTIBODY, IGM: Hep A IgM: NEGATIVE

## 2023-05-15 LAB — CERULOPLASMIN: Ceruloplasmin: 31.4 mg/dL (ref 19.0–39.0)

## 2023-05-15 LAB — GAMMA GT: GGT: 62 IU/L — ABNORMAL HIGH (ref 0–60)

## 2023-05-15 LAB — ANA: Anti Nuclear Antibody (ANA): NEGATIVE

## 2023-05-26 ENCOUNTER — Encounter: Payer: Self-pay | Admitting: Obstetrics

## 2024-04-24 ENCOUNTER — Other Ambulatory Visit: Payer: Self-pay | Admitting: Obstetrics

## 2024-04-24 DIAGNOSIS — Z309 Encounter for contraceptive management, unspecified: Secondary | ICD-10-CM

## 2024-05-01 MED ORDER — NORETHINDRONE 0.35 MG PO TABS
1.0000 | ORAL_TABLET | Freq: Every day | ORAL | 3 refills | Status: DC
Start: 2024-05-01 — End: 2024-05-16

## 2024-05-16 ENCOUNTER — Ambulatory Visit (INDEPENDENT_AMBULATORY_CARE_PROVIDER_SITE_OTHER): Admitting: Obstetrics

## 2024-05-16 ENCOUNTER — Encounter: Payer: Self-pay | Admitting: Obstetrics

## 2024-05-16 ENCOUNTER — Other Ambulatory Visit (HOSPITAL_COMMUNITY)
Admission: RE | Admit: 2024-05-16 | Discharge: 2024-05-16 | Disposition: A | Discharge status: Home / Self Care | Source: Ambulatory Visit | Attending: Obstetrics | Admitting: Obstetrics

## 2024-05-16 VITALS — BP 105/69 | HR 73 | Ht 63.0 in | Wt 235.0 lb

## 2024-05-16 DIAGNOSIS — Z124 Encounter for screening for malignant neoplasm of cervix: Secondary | ICD-10-CM

## 2024-05-16 DIAGNOSIS — E049 Nontoxic goiter, unspecified: Secondary | ICD-10-CM

## 2024-05-16 DIAGNOSIS — K582 Mixed irritable bowel syndrome: Secondary | ICD-10-CM

## 2024-05-16 DIAGNOSIS — Z01419 Encounter for gynecological examination (general) (routine) without abnormal findings: Secondary | ICD-10-CM | POA: Diagnosis not present

## 2024-05-16 DIAGNOSIS — Z309 Encounter for contraceptive management, unspecified: Secondary | ICD-10-CM

## 2024-05-16 DIAGNOSIS — R102 Pelvic and perineal pain: Secondary | ICD-10-CM

## 2024-05-16 MED ORDER — NORETHINDRONE 0.35 MG PO TABS
1.0000 | ORAL_TABLET | Freq: Every day | ORAL | 3 refills | Status: AC
Start: 2024-05-16 — End: ?

## 2024-05-16 NOTE — Progress Notes (Signed)
 ANNUAL GYNECOLOGICAL EXAM  SUBJECTIVE  HPI  Meagan Ancona Blakney is a 33 y.o.-year-old [id number] who presents for an annual gynecological exam today.  She denies abnormal vaginal bleeding or discharge, and UTI symptoms. She uses POPs for contraception and has light, regular periods. She reports that for the past 3 months, she has had increased cramping and pelvic pain about 7-10 days before her period. She also has difficulty having a bowel movement and feels like her rectum is inflamed. Sex is also painful during this time. The pain lasts a few days and then resolves. She is also struggling with alternating diarrhea and constipation, which has been a problem since her gallbladder was removed. She also reports frequent pain in her chest. The pain is worst when she lies on her left side, and it is primarily under her left breast. She also often feels tightness in her sternal area toward the left side. This feels different than chest pain that she sometimes experiences from anxiety. She denies palpitations, SOB, and lightheadedness. She has been experiencing an increase in acid reflux that she is managing with dietary changes and occasional Prilosec use.  Medical/Surgical History Past Medical History:  Diagnosis Date   Anxiety    Depression    Migraines    Post partum depression    Past Surgical History:  Procedure Laterality Date   CHOLECYSTECTOMY     FOOT SURGERY Left    KNEE SURGERY Left     Social History Lives with husband and 3 children. 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 Regular monthly periods Last Pap: 04/2021. NILM Contraception:  POPs  Prevention Mammogram: at 40 Colonoscopy: at 85   Current Medications Outpatient Medications Prior to Visit  Medication Sig   [DISCONTINUED] norethindrone  (MICRONOR ) 0.35 MG tablet Take 1 tablet (0.35 mg total) by mouth daily.   escitalopram  (LEXAPRO ) 10 MG tablet Take 10 mg by mouth daily. (Patient not taking: Reported on 05/16/2024)   ibuprofen  (ADVIL ) 600 MG tablet Take 1 tablet (600 mg total) by mouth every 6 (six) hours. (Patient not taking: Reported on 05/16/2024)   [DISCONTINUED] etonogestrel  (NEXPLANON ) 68 MG IMPL implant 1 each by Subdermal route once. (Patient not taking: Reported on 05/16/2024)   No facility-administered medications prior to visit.        ROS Constitutional: Denied constitutional symptoms, night sweats, recent illness, fatigue, fever, insomnia and weight loss.  Eyes: Denied eye symptoms, eye pain, photophobia, vision change and visual disturbance.  Ears/Nose/Throat/Neck: Denied ear, nose, throat or neck symptoms, hearing loss, nasal discharge, sinus congestion and sore throat.  Cardiovascular: Denied edema, exercise intolerance, orthopnea and palpitations. See HPI  Respiratory: Denied pulmonary symptoms, asthma, pleuritic pain, productive sputum, cough, dyspnea and wheezing.  Gastrointestinal: See HPI  Genitourinary: See HPI  Musculoskeletal: Denied musculoskeletal symptoms, stiffness, swelling, muscle weakness and myalgia.  Dermatologic: Denied dermatology symptoms, rash and scar.  Neurologic: Denied neurology symptoms, dizziness, headache, neck pain and syncope.  Psychiatric: Denied psychiatric symptoms,  anxiety and depression.  Endocrine: Denied endocrine symptoms including hot flashes and night sweats.    OBJECTIVE  BP 105/69   Pulse 73   Ht 5\' 3"  (1.6 m)   Wt 235 lb (106.6 kg)   BMI 41.63 kg/m    Physical examination General NAD, Conversant  HEENT Atraumatic; Op clear with mmm.  Normo-cephalic. Pupils reactive. Anicteric sclerae   Thyroid /Neck Thyroid  smooth and enlarged. Normal ROM.  Neck Supple.  Skin No rashes, lesions or ulceration. Normal palpated skin turgor. No nodularity.  Breasts: No masses or discharge.  Symmetric.  No axillary adenopathy.  Lungs: Clear to auscultation.No rales or wheezes. Normal Respiratory effort, no retractions.  Heart: NSR.  No murmurs or rubs appreciated. No peripheral edema  Abdomen: Soft.  Non-tender.  No masses.  No HSM. No hernia  Extremities: Moves all appropriately.  Normal ROM for age. No lymphadenopathy.  Neuro: Oriented to PPT.  Normal mood. Normal affect.     Pelvic:   Vulva: Normal appearance.  No lesions.  Vagina: No lesions or abnormalities noted.  Support: Normal pelvic support.  Urethra No masses tenderness or scarring.  Meatus Normal size without lesions or prolapse.  Cervix: Normal appearance.  No lesions.  Anus: Normal exam.  No lesions.  Perineum: Normal exam.  No lesions.    ASSESSMENT  1) Annual exam 2) Due for Pap 3) Contraceptive management 4) IBS symptoms and GERD 5) Pelvic pain 6) Intermittent chest pain 7) Enlarged thyroid   PLAN 1) Physical exam as noted. Discussed healthy lifestyle choices and preventive care. Declines STI testing. Routine labs done with PCP. 2) Pap collected. F/u based on results. 3) Refill sent for norethindrone  4) Recommend bowel regimen starting on CD14 of Colace and Miralax. Daily Prilosec x 2-3 weeks. Referral sent to GI. 5) Discussed possible GYN vs GI causes. Pelvic US  ordered 6) Reviewed possible non-cardiac causes and comfort measures. Strongly recommend f/u with PCP regarding ongoing chest pain. Reviewed danger signs and when to seek emergent medical attention 7)  Thyroid  US  ordered.  Return in one year for annual exam or as needed for concerns.   Minaal Struckman, CNM

## 2024-05-18 ENCOUNTER — Encounter: Payer: Self-pay | Admitting: Obstetrics

## 2024-05-23 ENCOUNTER — Ambulatory Visit: Payer: Self-pay | Admitting: Obstetrics

## 2024-05-23 LAB — CYTOLOGY - PAP
Comment: NEGATIVE
Diagnosis: NEGATIVE
High risk HPV: NEGATIVE

## 2024-07-05 ENCOUNTER — Encounter: Payer: Self-pay | Admitting: Gastroenterology

## 2024-07-13 ENCOUNTER — Ambulatory Visit: Admission: RE | Admit: 2024-07-13 | Source: Home / Self Care | Admitting: Gastroenterology

## 2024-07-13 ENCOUNTER — Encounter: Admission: RE | Payer: Self-pay | Source: Home / Self Care

## 2024-07-13 HISTORY — DX: Type 2 diabetes mellitus without complications: E11.9

## 2024-07-13 SURGERY — EGD (ESOPHAGOGASTRODUODENOSCOPY)
Anesthesia: General

## 2024-10-09 ENCOUNTER — Other Ambulatory Visit: Payer: Self-pay | Admitting: Physician Assistant

## 2024-10-09 DIAGNOSIS — E049 Nontoxic goiter, unspecified: Secondary | ICD-10-CM

## 2024-10-10 ENCOUNTER — Ambulatory Visit
Admission: RE | Admit: 2024-10-10 | Discharge: 2024-10-10 | Disposition: A | Discharge status: Home / Self Care | Source: Ambulatory Visit | Attending: Physician Assistant | Admitting: Physician Assistant

## 2024-10-10 DIAGNOSIS — E049 Nontoxic goiter, unspecified: Secondary | ICD-10-CM
